# Patient Record
Sex: Female | Born: 1995 | Race: Black or African American | Hispanic: No | Marital: Single | Smoking: Former smoker
Health system: Southern US, Community
[De-identification: ages and names within clinical notes are randomized; demographics above are authoritative.]

## PROBLEM LIST (undated history)

## (undated) ENCOUNTER — Inpatient Hospital Stay: Payer: Self-pay

## (undated) ENCOUNTER — Inpatient Hospital Stay: Payer: Medicaid Other

## (undated) DIAGNOSIS — G4730 Sleep apnea, unspecified: Secondary | ICD-10-CM

## (undated) DIAGNOSIS — E119 Type 2 diabetes mellitus without complications: Secondary | ICD-10-CM

## (undated) DIAGNOSIS — I10 Essential (primary) hypertension: Secondary | ICD-10-CM

## (undated) DIAGNOSIS — J45909 Unspecified asthma, uncomplicated: Secondary | ICD-10-CM

## (undated) HISTORY — PX: SKIN GRAFT: SHX250

## (undated) HISTORY — PX: TONSILLECTOMY: SUR1361

## (undated) HISTORY — PX: CHOLECYSTECTOMY: SHX55

---
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
## 2017-03-23 ENCOUNTER — Inpatient Hospital Stay
Admission: EM | Admit: 2017-03-23 | Discharge: 2017-03-24 | Disposition: A | Discharge status: Home / Self Care | Payer: Medicaid Other | Attending: Obstetrics and Gynecology | Admitting: Obstetrics and Gynecology

## 2017-03-23 ENCOUNTER — Encounter: Payer: Self-pay | Admitting: *Deleted

## 2017-03-23 DIAGNOSIS — Z3A31 31 weeks gestation of pregnancy: Secondary | ICD-10-CM | POA: Diagnosis not present

## 2017-03-23 DIAGNOSIS — R109 Unspecified abdominal pain: Secondary | ICD-10-CM

## 2017-03-23 DIAGNOSIS — O24113 Pre-existing diabetes mellitus, type 2, in pregnancy, third trimester: Secondary | ICD-10-CM | POA: Insufficient documentation

## 2017-03-23 DIAGNOSIS — O26899 Other specified pregnancy related conditions, unspecified trimester: Secondary | ICD-10-CM

## 2017-03-23 DIAGNOSIS — E119 Type 2 diabetes mellitus without complications: Secondary | ICD-10-CM | POA: Diagnosis present

## 2017-03-23 DIAGNOSIS — Z7984 Long term (current) use of oral hypoglycemic drugs: Secondary | ICD-10-CM | POA: Diagnosis not present

## 2017-03-23 DIAGNOSIS — O4703 False labor before 37 completed weeks of gestation, third trimester: Secondary | ICD-10-CM | POA: Diagnosis not present

## 2017-03-23 LAB — CBC
HCT: 36.5 % (ref 35.0–47.0)
Hemoglobin: 12.6 g/dL (ref 12.0–16.0)
MCH: 29.9 pg (ref 26.0–34.0)
MCHC: 34.5 g/dL (ref 32.0–36.0)
MCV: 86.6 fL (ref 80.0–100.0)
Platelets: 212 10*3/uL (ref 150–440)
RBC: 4.22 MIL/uL (ref 3.80–5.20)
RDW: 13.7 % (ref 11.5–14.5)
WBC: 8.4 10*3/uL (ref 3.6–11.0)

## 2017-03-23 LAB — GLUCOSE, CAPILLARY: GLUCOSE-CAPILLARY: 312 mg/dL — AB (ref 65–99)

## 2017-03-23 MED ORDER — PRENATAL MULTIVITAMIN CH
1.0000 | ORAL_TABLET | Freq: Every day | ORAL | Status: DC
  Filled 2017-03-23: qty 1

## 2017-03-23 MED ORDER — LACTATED RINGERS IV BOLUS (SEPSIS)
500.0000 mL | Freq: Once | INTRAVENOUS | Status: AC
  Administered 2017-03-23: 500 mL via INTRAVENOUS

## 2017-03-23 MED ORDER — INSULIN REGULAR NICU BOLUS VIA INFUSION: 2.0000 [IU] | Freq: Once | INTRAVENOUS | Status: DC

## 2017-03-23 MED ORDER — ZOLPIDEM TARTRATE 5 MG PO TABS: 5.0000 mg | ORAL_TABLET | Freq: Every evening | ORAL | Status: DC | PRN

## 2017-03-23 MED ORDER — LACTATED RINGERS IV SOLN
INTRAVENOUS | Status: DC
  Administered 2017-03-23: via INTRAVENOUS

## 2017-03-23 MED ORDER — CALCIUM CARBONATE ANTACID 500 MG PO CHEW: 2.0000 | CHEWABLE_TABLET | ORAL | Status: DC | PRN

## 2017-03-23 MED ORDER — ACETAMINOPHEN 325 MG PO TABS: 650.0000 mg | ORAL_TABLET | ORAL | Status: DC | PRN

## 2017-03-23 NOTE — OB Triage Note (Signed)
recvd pt via EMS. Pt c/o contractions but is unable to tell us how far apart. Feeling baby move ok. No intercourse in the past 24 hours

## 2017-03-24 ENCOUNTER — Encounter: Payer: Self-pay | Admitting: *Deleted

## 2017-03-24 DIAGNOSIS — R109 Unspecified abdominal pain: Secondary | ICD-10-CM

## 2017-03-24 DIAGNOSIS — O24113 Pre-existing diabetes mellitus, type 2, in pregnancy, third trimester: Secondary | ICD-10-CM | POA: Diagnosis not present

## 2017-03-24 DIAGNOSIS — O4703 False labor before 37 completed weeks of gestation, third trimester: Secondary | ICD-10-CM | POA: Diagnosis not present

## 2017-03-24 DIAGNOSIS — Z3A31 31 weeks gestation of pregnancy: Secondary | ICD-10-CM | POA: Diagnosis not present

## 2017-03-24 DIAGNOSIS — O26899 Other specified pregnancy related conditions, unspecified trimester: Secondary | ICD-10-CM

## 2017-03-24 LAB — URINE DRUG SCREEN, QUALITATIVE (ARMC ONLY)
Amphetamines, Ur Screen: NOT DETECTED
BARBITURATES, UR SCREEN: NOT DETECTED
BENZODIAZEPINE, UR SCRN: NOT DETECTED
CANNABINOID 50 NG, UR ~~LOC~~: NOT DETECTED
Cocaine Metabolite,Ur ~~LOC~~: NOT DETECTED
MDMA (Ecstasy)Ur Screen: NOT DETECTED
METHADONE SCREEN, URINE: NOT DETECTED
Opiate, Ur Screen: NOT DETECTED
Phencyclidine (PCP) Ur S: NOT DETECTED
TRICYCLIC, UR SCREEN: NOT DETECTED

## 2017-03-24 LAB — URINALYSIS, ROUTINE W REFLEX MICROSCOPIC
Bacteria, UA: NONE SEEN
Bilirubin Urine: NEGATIVE
Glucose, UA: >=500 mg/dL — AB
Hgb urine dipstick: NEGATIVE
KETONES UR: NEGATIVE mg/dL
LEUKOCYTES UA: NEGATIVE
NITRITE: NEGATIVE
PH: 6 (ref 5.0–8.0)
Protein, ur: 30 mg/dL — AB
SPECIFIC GRAVITY, URINE: 1.031 — AB (ref 1.005–1.030)
WBC, UA: NONE SEEN WBC/hpf (ref 0–5)

## 2017-03-24 LAB — TYPE AND SCREEN
ABO/RH(D): A POS
ANTIBODY SCREEN: NEGATIVE

## 2017-03-24 LAB — GLUCOSE, CAPILLARY
GLUCOSE-CAPILLARY: 178 mg/dL — AB (ref 65–99)
Glucose-Capillary: 247 mg/dL — ABNORMAL HIGH (ref 65–99)
Glucose-Capillary: 225 mg/dL — ABNORMAL HIGH (ref 65–99)

## 2017-03-24 MED ORDER — METFORMIN HCL 500 MG PO TABS: 1000.0000 mg | ORAL_TABLET | Freq: Two times a day (BID) | ORAL | 0 refills | Status: DC

## 2017-03-24 MED ORDER — INSULIN REGULAR HUMAN 100 UNIT/ML IJ SOLN
2.0000 [IU] | Freq: Once | INTRAMUSCULAR | Status: DC
Start: 2017-03-24 — End: 2017-03-24
  Filled 2017-03-24: qty 0.02

## 2017-03-24 MED ORDER — INSULIN REGULAR HUMAN 100 UNIT/ML IJ SOLN
10.0000 [IU] | Freq: Once | INTRAMUSCULAR | Status: AC
  Administered 2017-03-24: 10 [IU] via SUBCUTANEOUS
  Filled 2017-03-24: qty 0.1

## 2017-03-24 MED ORDER — METFORMIN HCL 500 MG PO TABS
500.0000 mg | ORAL_TABLET | ORAL | Status: AC
  Administered 2017-03-24: 500 mg via ORAL
  Filled 2017-03-24 (×2): qty 1

## 2017-03-24 MED ORDER — METFORMIN HCL 500 MG PO TABS
500.0000 mg | ORAL_TABLET | Freq: Every day | ORAL | Status: DC
  Administered 2017-03-24: 500 mg via ORAL
  Filled 2017-03-24: qty 1

## 2017-03-24 NOTE — Discharge Summary (Signed)
L&D OB Triage Note    SUBJECTIVE Cassandra Flynn is a 21 y.o. G3P1 female at [redacted]w[redacted]d, EDD Estimated Date of Delivery: 05/21/17 who presented to triage with complaints of abdominal pain.   She is a type 2 diabetic who learned she was pregnant last week.  She is [redacted] weeks EGA by late U/S.  Previous C/D.  Taking  metformin BID.  She is followed at [facility] for her prenatal care.  Obstetric History   G3   P1   T0   P0   A0   L0    SAB0   TAB0   Ectopic0   Multiple0   Live Births0     # Outcome Date GA Lbr Len/2nd Weight Sex Delivery Anes PTL Lv  3 Current           2 Gravida           1 Para               Prescriptions Prior to Admission  Medication Sig Dispense Refill Last Dose  . [DISCONTINUED] metFORMIN (GLUCOPHAGE) 500 MG tablet Take by mouth daily with breakfast.   03/23/2017 at Unknown time  . buPROPion 450 MG TB24 Take 450 mg by mouth daily. (Patient not taking: Reported on 02/14/2016) 30 tablet 0 Not Taking at Unknown time  . HYDROcodone-acetaminophen (NORCO) 5-325 MG tablet Take 1 tablet by mouth every 6 (six) hours as needed for severe pain. 10 tablet 0   . lurasidone (LATUDA) 40 MG TABS tablet Take 1 tablet (40 mg total) by mouth daily with breakfast. (Patient not taking: Reported on 02/14/2016) 30 tablet 0 Completed Course at Unknown time  . nitrofurantoin (MACRODANTIN) 100 MG capsule Take 1 capsule (100 mg total) by mouth 2 (two) times daily. (Patient not taking: Reported on 02/14/2016) 6 capsule 0 Completed Course at Unknown time  . norelgestromin-ethinyl estradiol (ORTHO EVRA) 150-35 MCG/24HR transdermal patch Place 1 patch onto the skin once a week. Reported on 02/14/2016   Completed Course at Unknown time  . ondansetron (ZOFRAN ODT) 8 MG disintegrating tablet Take 1 tablet (8 mg total) by mouth every 8 (eight) hours as needed for nausea or vomiting. (Patient not taking: Reported on 02/14/2016) 20 tablet 0 Completed Course at Unknown time  . ranitidine (ZANTAC) 150 MG capsule Take 1  capsule (150 mg total) by mouth 2 (two) times daily. (Patient not taking: Reported on 02/14/2016) 28 capsule 0 Completed Course at Unknown time  . traZODone (DESYREL) 25 mg TABS tablet Take 0.5 tablets (25 mg total) by mouth at bedtime as needed for sleep. (Patient not taking: Reported on 02/14/2016) 30 tablet 0 Completed Course at Unknown time     OBJECTIVE  Nursing Evaluation:   BP (!) 109/57 (BP Location: Left Arm)   Pulse (!) 101   Temp 98.2 F (36.8 C) (Oral)   Resp 20   LMP 08/14/2016 (LMP Unknown)    Findings:   Irregular, mild contractions.  Cervix FT.        Blood glucose 300+      NST was performed and has been reviewed by me.  NST INTERPRETATION: Category I  Mode: External Baseline Rate (A): 135 bpm Variability: Moderate Accelerations: 15 x 15 Decelerations: None     Contraction Frequency (min): none noted at discharge.  Pt gradually became more comfortable through the night and by AM her abdominal pain was gone.   Her blood sugars were modified by giving 20u sub Q insulin followed by   of metformin. Her blood sugar at discharge was 170 immediately prior to her morning metformin dose.   ASSESSMENT Impression:  1. Pregnancy:  G3P1 at [redacted]w[redacted]d , EDD Estimated Date of Delivery: 05/21/17 2.  reactive  PLAN 1. Reassurance given 2. Discharge home with precautions to return to L&D or call the office if:  contractions more than  12 per  1 hour or bleeding from vaginal area  3. Continue routine prenatal care at [facility]. 4.  I have changed her metformin dose to  PO BID. 5. We have discussed fingerstick glucose monitoring 4 times per day. 6.  She has been advised to keep her appointment at [facility] on Tuesday.    I was directly present for this patient's care on 4-13 and again for her discharge on 4-14.

## 2017-08-10 ENCOUNTER — Emergency Department: Payer: Medicaid Other

## 2017-08-10 ENCOUNTER — Encounter: Payer: Self-pay | Admitting: Emergency Medicine

## 2017-08-10 ENCOUNTER — Emergency Department
Admission: EM | Admit: 2017-08-10 | Discharge: 2017-08-11 | Disposition: A | Discharge status: Home / Self Care | Payer: Medicaid Other | Attending: Student in an Organized Health Care Education/Training Program | Admitting: Student in an Organized Health Care Education/Training Program

## 2017-08-10 DIAGNOSIS — R079 Chest pain, unspecified: Secondary | ICD-10-CM | POA: Diagnosis present

## 2017-08-10 DIAGNOSIS — J45909 Unspecified asthma, uncomplicated: Secondary | ICD-10-CM | POA: Diagnosis not present

## 2017-08-10 DIAGNOSIS — E119 Type 2 diabetes mellitus without complications: Secondary | ICD-10-CM | POA: Diagnosis not present

## 2017-08-10 DIAGNOSIS — R0602 Shortness of breath: Secondary | ICD-10-CM | POA: Diagnosis not present

## 2017-08-10 DIAGNOSIS — R0789 Other chest pain: Secondary | ICD-10-CM | POA: Insufficient documentation

## 2017-08-10 DIAGNOSIS — Z7984 Long term (current) use of oral hypoglycemic drugs: Secondary | ICD-10-CM | POA: Insufficient documentation

## 2017-08-10 LAB — CBC
HCT: 41.9 % (ref 35.0–47.0)
Hemoglobin: 14.2 g/dL (ref 12.0–16.0)
MCH: 29.3 pg (ref 26.0–34.0)
MCHC: 34 g/dL (ref 32.0–36.0)
MCV: 86.2 fL (ref 80.0–100.0)
PLATELETS: 253 10*3/uL (ref 150–440)
RBC: 4.86 MIL/uL (ref 3.80–5.20)
RDW: 15.2 % — ABNORMAL HIGH (ref 11.5–14.5)
WBC: 8.5 10*3/uL (ref 3.6–11.0)

## 2017-08-10 LAB — TROPONIN I: Troponin I: <0.03 ng/mL (ref <0.03)

## 2017-08-10 LAB — BASIC METABOLIC PANEL
Anion gap: 11 (ref 5–15)
BUN: 16 mg/dL (ref 6–20)
CO2: 24 mmol/L (ref 22–32)
CREATININE: 0.86 mg/dL (ref 0.44–1.00)
Calcium: 9.9 mg/dL (ref 8.9–10.3)
Chloride: 99 mmol/L — ABNORMAL LOW (ref 101–111)
GFR calc non Af Amer: >60 mL/min (ref >60)
Glucose, Bld: 349 mg/dL — ABNORMAL HIGH (ref 65–99)
Potassium: 4.4 mmol/L (ref 3.5–5.1)
Sodium: 134 mmol/L — ABNORMAL LOW (ref 135–145)

## 2017-08-10 LAB — POCT PREGNANCY, URINE: Preg Test, Ur: NEGATIVE

## 2017-08-10 MED ORDER — IPRATROPIUM-ALBUTEROL 0.5-2.5 (3) MG/3ML IN SOLN
3.0000 mL | Freq: Once | RESPIRATORY_TRACT | Status: AC
  Administered 2017-08-10: 3 mL via RESPIRATORY_TRACT
  Filled 2017-08-10: qty 3

## 2017-08-10 NOTE — ED Triage Notes (Signed)
Pt ambulatory to triage with steady gait, no distress noted. Pt c/o constant central chest pain x2 days with SOB and worsening when taking a deep breath. Pt denies N/V.

## 2017-08-10 NOTE — ED Notes (Signed)
Urged patient to void, placed hat in toilet

## 2017-08-10 NOTE — ED Provider Notes (Signed)
[facility] Emergency Department Provider Note    First MD Initiated Contact with Patient 08/10/17 2229     (approximate)  I have reviewed the triage vital signs and the nursing notes.   HISTORY  Chief Complaint Chest Pain    HPI Cassandra Flynn is a 21 y.o. female history of diabetes as well as asthma presents with several days of pleuritic chest pain associated with shortness of breath. Has had bronchitis before but feels this is different. No lower extremity swelling. No history of blood clots. Denies any chance of being pregnant but is on birth control. States the pain is worse with taking a breath. There is a antibiotics. No recent steroids.   Past Medical History:  Diagnosis Date  . Asthma   . Diabetes mellitus without complication (HCC)   . Sleep apnea    History reviewed. No pertinent family history. Past Surgical History:  Procedure Laterality Date  . CESAREAN SECTION    . CHOLECYSTECTOMY     Patient Active Problem List   Diagnosis Date Noted  . Abdominal pain affecting pregnancy, antepartum 03/24/2017  . Type 2 diabetes mellitus affecting pregnancy in third trimester, antepartum 03/24/2017  . Morbid obesity (HCC) 03/24/2017  . Labor and delivery, indication for care 02/22/2016  . Post traumatic stress disorder (PTSD) 06/16/2014  . Bipolar I disorder, most recent episode (or current) mixed, severe, specified as with psychotic behavior 06/16/2014  . ODD (oppositional defiant disorder) 06/16/2014      Prior to Admission medications   Medication Sig Start Date End Date Taking? Authorizing Provider  albuterol (PROVENTIL HFA;VENTOLIN HFA) 108 (90 Base) MCG/ACT inhaler Inhale 2 puffs into the lungs every 6 (six) hours as needed for wheezing or shortness of breath. 08/11/17   Willy Eddy, MD  metFORMIN (GLUCOPHAGE) 500 MG tablet Take 2 tablets (1,000 mg total) by mouth 2 (two) times daily with a meal. 03/24/17   Linzie Collin,  MD  norelgestromin-ethinyl estradiol (ORTHO EVRA) 150-35 MCG/24HR transdermal patch Place 1 patch onto the skin once a week. Reported on 02/14/2016    [provider]  ondansetron (ZOFRAN ODT) 8 MG disintegrating tablet Take 1 tablet (8 mg total) by mouth every 8 (eight) hours as needed for nausea or vomiting. Patient not taking: Reported on 02/14/2016 08/29/15   Sharman Cheek, MD  predniSONE (DELTASONE) 20 MG tablet Take 2 tablets (40 mg total) by mouth daily. 08/11/17 08/16/17  Willy Eddy, MD  ranitidine (ZANTAC) 150 MG capsule Take 1 capsule (150 mg total) by mouth 2 (two) times daily. Patient not taking: Reported on 02/14/2016 08/29/15   Sharman Cheek, MD  traZODone (DESYREL) 25 mg TABS tablet Take 0.5 tablets (25 mg total) by mouth at bedtime as needed for sleep. Patient not taking: Reported on 02/14/2016 06/23/14   Kendrick Fries, NP    Allergies Penicillins    Social History Social History  Substance Use Topics  . Smoking status: Never Smoker  . Smokeless tobacco: Never Used  . Alcohol use No    Review of Systems Patient denies headaches, rhinorrhea, blurry vision, numbness, shortness of breath, chest pain, edema, cough, abdominal pain, nausea, vomiting, diarrhea, dysuria, fevers, rashes or hallucinations unless otherwise stated above in HPI. ____________________________________________   PHYSICAL EXAM:  VITAL SIGNS: Vitals:   08/10/17 1935 08/10/17 2259  BP: 119/64 (!) 134/98  Pulse: (!) 110 (!) 112  Resp: 18 18  Temp: 98.4 F (36.9 C)   SpO2: 99% 100%    Constitutional: Alert  and oriented. Morbidly obese, in no acute distress. Eyes: Conjunctivae are normal.  Head: Atraumatic. Nose: No congestion/rhinnorhea. Mouth/Throat: Mucous membranes are moist.   Neck: No stridor. Painless ROM.  Cardiovascular: Normal rate, regular rhythm. Grossly normal heart sounds.  Good peripheral circulation. Respiratory: Normal respiratory effort.  No retractions. Lungs  with coarse bilateral breathsounds and good air movement. Gastrointestinal: Soft and nontender. No distention. No abdominal bruits. No CVA tenderness. Genitourinary:  Musculoskeletal: No lower extremity tenderness nor edema.  No joint effusions. Neurologic:  Normal speech and language. No gross focal neurologic deficits are appreciated. No facial droop Skin:  Skin is warm, dry and intact. No rash noted. Psychiatric: Mood and affect are normal. Speech and behavior are normal.  ____________________________________________   LABS (all labs ordered are listed, but only abnormal results are displayed)  Results for orders placed or performed during the hospital encounter of 08/10/17 (from the past 24 hour(s))  Basic metabolic panel     Status: Abnormal   Collection Time: 08/10/17  7:29 PM  Result Value Ref Range   Sodium 134 (L) 135 - 145 mmol/L   Potassium 4.4 3.5 - 5.1 mmol/L   Chloride 99 (L) 101 - 111 mmol/L   CO2 24 22 - 32 mmol/L   Glucose, Bld 349 (H) 65 - 99 mg/dL   BUN 16 6 - 20 mg/dL   Creatinine, Ser 0.10 0.44 - 1.00 mg/dL   Calcium 9.9 8.9 - 27.2 mg/dL   GFR calc non Af Amer >60 >60 mL/min   GFR calc Af Amer >60 >60 mL/min   Anion gap 11 5 - 15  CBC     Status: Abnormal   Collection Time: 08/10/17  7:29 PM  Result Value Ref Range   WBC 8.5 3.6 - 11.0 K/uL   RBC 4.86 3.80 - 5.20 MIL/uL   Hemoglobin 14.2 12.0 - 16.0 g/dL   HCT 53.6 64.4 - 03.4 %   MCV 86.2 80.0 - 100.0 fL   MCH 29.3 26.0 - 34.0 pg   MCHC 34.0 32.0 - 36.0 g/dL   RDW 74.2 (H) 59.5 - 63.8 %   Platelets 253 150 - 440 K/uL  Troponin I     Status: None   Collection Time: 08/10/17  7:29 PM  Result Value Ref Range   Troponin I <0.03 <0.03 ng/mL  Pregnancy, urine POC     Status: None   Collection Time: 08/11/17 12:01 AM  Result Value Ref Range   Preg Test, Ur NEGATIVE NEGATIVE   ____________________________________________  EKG My review and personal interpretation at Time: 19:26   Indication: chest  pain  Rate: 110  Rhythm: sinus Axis: normal Other: normal intervals, no stemi, no pr depressions ____________________________________________  RADIOLOGY  I personally reviewed all radiographic images ordered to evaluate for the above acute complaints and reviewed radiology reports and findings.  These findings were personally discussed with the patient.  Please see medical record for radiology report.  ____________________________________________   PROCEDURES  Procedure(s) performed:  Procedures    Critical Care performed: no ____________________________________________   INITIAL IMPRESSION / ASSESSMENT AND PLAN / ED COURSE  Pertinent labs & imaging results that were available during my care of the patient were reviewed by me and considered in my medical decision making (see chart for details).  DDX: Asthma, copd, CHF, pna, ptx, malignancy, Pe, anemia   Cassandra Flynn is a 21 y.o. who presents to the ED with shortness breath and pleuritic chest pain as described above. Does go  exam is more suggestive of asthma versus bronchitis exacerbation. Abdominal exam is soft and benign. EKG shows no evidence of dysrhythmia or ACS. Chest x-ray shows bronchitic changes. As the patient is on birth control describing pleuritic chest pain and do feel that she needs further risk stratification d-dimer. Patient will be signed out to Dr. Dolores FrameSung pending results of tests. I do anticipate discharge, she is otherwise in no acute distress.      ____________________________________________   FINAL CLINICAL IMPRESSION(S) / ED DIAGNOSES  Final diagnoses:  Shortness of breath      NEW MEDICATIONS STARTED DURING THIS VISIT:  New Prescriptions   ALBUTEROL (PROVENTIL HFA;VENTOLIN HFA) 108 (90 BASE) MCG/ACT INHALER    Inhale 2 puffs into the lungs every 6 (six) hours as needed for wheezing or shortness of breath.   PREDNISONE (DELTASONE) 20 MG TABLET    Take 2 tablets (40 mg total) by mouth  daily.     Note:  This document was prepared using Dragon voice recognition software and may include unintentional dictation errors.    Willy Eddyobinson, Jaspreet Bodner, MD 08/11/17 [phone]

## 2017-08-11 ENCOUNTER — Emergency Department: Payer: Medicaid Other

## 2017-08-11 LAB — FIBRIN DERIVATIVES D-DIMER (ARMC ONLY): Fibrin derivatives D-dimer (ARMC): 563.24 — ABNORMAL HIGH (ref 0.00–499.00)

## 2017-08-11 MED ORDER — IOPAMIDOL (ISOVUE-370) INJECTION 76%
75.0000 mL | Freq: Once | INTRAVENOUS | Status: AC | PRN
  Administered 2017-08-11: 75 mL via INTRAVENOUS

## 2017-08-11 MED ORDER — HYDROCODONE-ACETAMINOPHEN 5-325 MG PO TABS
1.0000 | ORAL_TABLET | Freq: Once | ORAL | Status: AC
  Administered 2017-08-11: 1 via ORAL
  Filled 2017-08-11: qty 1

## 2017-08-11 MED ORDER — PREDNISONE 20 MG PO TABS: 40.0000 mg | ORAL_TABLET | Freq: Every day | ORAL | 0 refills | Status: AC

## 2017-08-11 MED ORDER — ALBUTEROL SULFATE HFA 108 (90 BASE) MCG/ACT IN AERS: 2.0000 | INHALATION_SPRAY | Freq: Four times a day (QID) | RESPIRATORY_TRACT | 2 refills | Status: DC | PRN

## 2017-08-11 MED ORDER — KETOROLAC TROMETHAMINE 30 MG/ML IJ SOLN
10.0000 mg | Freq: Once | INTRAMUSCULAR | Status: AC
  Administered 2017-08-11: 9.9 mg via INTRAVENOUS
  Filled 2017-08-11: qty 1

## 2017-08-11 MED ORDER — HYDROCODONE-ACETAMINOPHEN 5-325 MG PO TABS: 1.0000 | ORAL_TABLET | Freq: Four times a day (QID) | ORAL | 0 refills | Status: DC | PRN

## 2017-08-11 MED ORDER — IBUPROFEN 800 MG PO TABS: 800.0000 mg | ORAL_TABLET | Freq: Three times a day (TID) | ORAL | 0 refills | Status: DC | PRN

## 2017-08-11 NOTE — Discharge Instructions (Addendum)
1. You may take pain medicines as needed (Motrin/Norco #15). 2. Apply moist heat to affected area several times daily. 3. Return to the ER for worsening symptoms, persistent vomiting, difficulty breathing, or other concerns.

## 2017-08-11 NOTE — ED Provider Notes (Signed)
-----------------------------------------   2:10 AM on 08/11/2017 -----------------------------------------  Patient sleeping in no acute distress. Awakened her to update her of mildly elevated Ddimer result. Rates chest pain 8/10. Will attempt to establish IV for CTA chest to evaluate PE.  ----------------------------------------- 3:40 AM on 08/11/2017 -----------------------------------------  CT chest interpreted per Dr. Clovis RileyMitchell: Negative for acute pulmonary embolism.    No significant abnormality in the chest. Marked fatty infiltration  of the liver, incompletely imaged.   Updated patient of CT chest. Will prescribe NSAIDs, analgesia for chest pain. Strict return precautions given. Patient verbalizes understanding and agrees with plan of care.   Irean HongSung, Giancarlos Berendt J, MD 08/11/17 [phone]

## 2017-08-12 ENCOUNTER — Emergency Department: Payer: Medicaid Other

## 2017-08-12 ENCOUNTER — Emergency Department
Admission: EM | Admit: 2017-08-12 | Discharge: 2017-08-12 | Disposition: A | Discharge status: Home / Self Care | Payer: Medicaid Other | Attending: Emergency Medicine | Admitting: Emergency Medicine

## 2017-08-12 ENCOUNTER — Encounter: Payer: Self-pay | Admitting: Emergency Medicine

## 2017-08-12 DIAGNOSIS — E1165 Type 2 diabetes mellitus with hyperglycemia: Secondary | ICD-10-CM | POA: Diagnosis not present

## 2017-08-12 DIAGNOSIS — J45909 Unspecified asthma, uncomplicated: Secondary | ICD-10-CM | POA: Diagnosis not present

## 2017-08-12 DIAGNOSIS — M25551 Pain in right hip: Secondary | ICD-10-CM

## 2017-08-12 DIAGNOSIS — W19XXXA Unspecified fall, initial encounter: Secondary | ICD-10-CM

## 2017-08-12 DIAGNOSIS — R739 Hyperglycemia, unspecified: Secondary | ICD-10-CM

## 2017-08-12 LAB — CBC WITH DIFFERENTIAL/PLATELET
BASOS ABS: 0.1 10*3/uL (ref 0–0.1)
Basophils Relative: 1 %
EOS ABS: 0.4 10*3/uL (ref 0–0.7)
EOS PCT: 4 %
HCT: 38.8 % (ref 35.0–47.0)
Hemoglobin: 13.3 g/dL (ref 12.0–16.0)
LYMPHS PCT: 31 %
Lymphs Abs: 2.8 10*3/uL (ref 1.0–3.6)
MCH: 29.6 pg (ref 26.0–34.0)
MCHC: 34.2 g/dL (ref 32.0–36.0)
MCV: 86.5 fL (ref 80.0–100.0)
Monocytes Absolute: 0.5 10*3/uL (ref 0.2–0.9)
Monocytes Relative: 6 %
Neutro Abs: 5.1 10*3/uL (ref 1.4–6.5)
Neutrophils Relative %: 58 %
Platelets: 256 10*3/uL (ref 150–440)
RBC: 4.48 MIL/uL (ref 3.80–5.20)
RDW: 14.9 % — AB (ref 11.5–14.5)
WBC: 8.8 10*3/uL (ref 3.6–11.0)

## 2017-08-12 LAB — COMPREHENSIVE METABOLIC PANEL
ALBUMIN: 3.9 g/dL (ref 3.5–5.0)
ALT: 82 U/L — ABNORMAL HIGH (ref 14–54)
ANION GAP: 11 (ref 5–15)
AST: 109 U/L — AB (ref 15–41)
Alkaline Phosphatase: 73 U/L (ref 38–126)
BILIRUBIN TOTAL: 1 mg/dL (ref 0.3–1.2)
BUN: 15 mg/dL (ref 6–20)
CHLORIDE: 103 mmol/L (ref 101–111)
CO2: 20 mmol/L — AB (ref 22–32)
Calcium: 9.6 mg/dL (ref 8.9–10.3)
Creatinine, Ser: 0.92 mg/dL (ref 0.44–1.00)
GFR calc Af Amer: >60 mL/min (ref >60)
GFR calc non Af Amer: >60 mL/min (ref >60)
GLUCOSE: 364 mg/dL — AB (ref 65–99)
POTASSIUM: 4.7 mmol/L (ref 3.5–5.1)
SODIUM: 134 mmol/L — AB (ref 135–145)
Total Protein: 8 g/dL (ref 6.5–8.1)

## 2017-08-12 LAB — HCG, QUANTITATIVE, PREGNANCY: hCG, Beta Chain, Quant, S: <1 m[IU]/mL (ref <5)

## 2017-08-12 MED ORDER — HYDROMORPHONE HCL 1 MG/ML IJ SOLN
1.0000 mg | Freq: Once | INTRAMUSCULAR | Status: AC
  Administered 2017-08-12: 1 mg via INTRAVENOUS
  Filled 2017-08-12: qty 1

## 2017-08-12 MED ORDER — OXYCODONE-ACETAMINOPHEN 5-325 MG PO TABS: 1.0000 | ORAL_TABLET | Freq: Four times a day (QID) | ORAL | 0 refills | Status: DC | PRN

## 2017-08-12 NOTE — ED Notes (Signed)

## 2017-08-12 NOTE — ED Notes (Signed)
Pt given walker as crutches have 300lb weight limit. Explained to pt that walker also only has 350lb weight limit and thus not to put her full weight onto it but use it for balance support while putting most of weight onto her good leg.

## 2017-08-12 NOTE — ED Provider Notes (Addendum)
[facility] Emergency Department Provider Note       Time seen: ----------------------------------------- 4:21 PM on 08/12/2017 -----------------------------------------     I have reviewed the triage vital signs and the nursing notes.   HISTORY   Chief Complaint Hip Pain    HPI Cassandra Flynn is a 21 y.o. female who presents to the ED for a slip and fall. Patient states she has a mat on her front porch and he gets very slippery when it is wet which resulted in a fall today. Patient states she fell several weeks ago same way but did not come to the ER. Main complaint today is severe, 10 out of 10 pain in the right hip. Patient is having trouble walking due to the pain. She denies any other injuries or complaints. She has had recent back pain. Patient describes paresthesias in the right.   Past Medical History:  Diagnosis Date  . Asthma   . Diabetes mellitus without complication (HCC)   . Sleep apnea     Patient Active Problem List   Diagnosis Date Noted  . Abdominal pain affecting pregnancy, antepartum 03/24/2017  . Type 2 diabetes mellitus affecting pregnancy in third trimester, antepartum 03/24/2017  . Morbid obesity (HCC) 03/24/2017  . Labor and delivery, indication for care 02/22/2016  . Post traumatic stress disorder (PTSD) 06/16/2014  . Bipolar I disorder, most recent episode (or current) mixed, severe, specified as with psychotic behavior 06/16/2014  . ODD (oppositional defiant disorder) 06/16/2014    Past Surgical History:  Procedure Laterality Date  . CESAREAN SECTION    . CHOLECYSTECTOMY      Allergies Penicillins  Social History Social History  Substance Use Topics  . Smoking status: Never Smoker  . Smokeless tobacco: Never Used  . Alcohol use No    Review of Systems Constitutional: Negative for fever. Cardiovascular: Negative for chest pain. Respiratory: Negative for shortness of breath. Gastrointestinal:  Negative for abdominal pain, vomiting and diarrhea. Genitourinary: Negative for dysuria. Musculoskeletal: Positive for right hip and leg pain Skin: Negative for rash. Neurological: Negative for headaches, positive for right thigh paresthesias.  All systems negative/normal/unremarkable except as stated in the HPI  ____________________________________________   PHYSICAL EXAM:  VITAL SIGNS: ED Triage Vitals  Enc Vitals Group     BP      Pulse      Resp      Temp      Temp src      SpO2      Weight      Height      Head Circumference      Peak Flow      Pain Score      Pain Loc      Pain Edu?      Excl. in GC?     Constitutional: Alert and oriented. Mild distress from pain Eyes: Conjunctivae are normal. Normal extraocular movements. ENT   Head: Normocephalic and atraumatic.   Nose: No congestion/rhinnorhea.   Mouth/Throat: Mucous membranes are moist.   Neck: No stridor. Cardiovascular: Normal rate, regular rhythm. No murmurs, rubs, or gallops. Respiratory: Normal respiratory effort without tachypnea nor retractions. Breath sounds are clear and equal bilaterally. No wheezes/rales/rhonchi. Gastrointestinal: Soft and nontender. Normal bowel sounds Musculoskeletal: Severe tenderness around the right hip, severe pain range of motion the right hip. Neurologic:  Normal speech and language. No gross focal neurologic deficits are appreciated. Paresthesias noted to the right thigh laterally Skin:  Skin is warm, dry and  intact. No rash noted. Psychiatric: Mood and affect are normal. Speech and behavior are normal.  ___________________________________________  ED COURSE:  Pertinent labs & imaging results that were available during my care of the patient were reviewed by me and considered in my medical decision making (see chart for details). Patient presents for a fall with right hip pain, we will assess with labs and imaging as indicated.    Procedures ____________________________________________   LABS (pertinent positives/negatives)  Labs Reviewed  CBC WITH DIFFERENTIAL/PLATELET - Abnormal; Notable for the following:       Result Value   RDW 14.9 (*)    All other components within normal limits  COMPREHENSIVE METABOLIC PANEL - Abnormal; Notable for the following:    Sodium 134 (*)    CO2 20 (*)    Glucose, Bld 364 (*)    AST 109 (*)    ALT 82 (*)    All other components within normal limits  HCG, QUANTITATIVE, PREGNANCY    RADIOLOGY Images were viewed by me  Right hip x-rays, lumbar spine Are negative ____________________________________________  FINAL ASSESSMENT AND PLAN  Fall, right hip contusion, hyperglycemia  Plan: Patient's labs and imaging were dictated above. Patient had presented for a mechanical fall with right hip pain. X-rays were negative for any fracture. She is able to ambulate with some pain using crutches but otherwise has had a negative workup for injury. She is hyperglycemic and have advised her to stick to the diabetic diet as well as continue taking oral medicines for diabetes.   Emily FilbertWilliams, Jonathan E, MD   Note: This note was generated in part or whole with voice recognition software. Voice recognition is usually quite accurate but there are transcription errors that can and very often do occur. I apologize for any typographical errors that were not detected and corrected.     Emily FilbertWilliams, Jonathan E, MD 08/12/17 1738    Emily FilbertWilliams, Jonathan E, MD 08/12/17 [phone]

## 2017-08-26 ENCOUNTER — Encounter: Payer: Self-pay | Admitting: *Deleted

## 2017-08-26 ENCOUNTER — Emergency Department
Admission: EM | Admit: 2017-08-26 | Discharge: 2017-08-26 | Disposition: A | Discharge status: Home / Self Care | Payer: Medicaid Other | Attending: Emergency Medicine | Admitting: Emergency Medicine

## 2017-08-26 DIAGNOSIS — J45909 Unspecified asthma, uncomplicated: Secondary | ICD-10-CM | POA: Diagnosis not present

## 2017-08-26 DIAGNOSIS — Z7984 Long term (current) use of oral hypoglycemic drugs: Secondary | ICD-10-CM | POA: Insufficient documentation

## 2017-08-26 DIAGNOSIS — R440 Auditory hallucinations: Secondary | ICD-10-CM | POA: Insufficient documentation

## 2017-08-26 DIAGNOSIS — F319 Bipolar disorder, unspecified: Secondary | ICD-10-CM | POA: Diagnosis not present

## 2017-08-26 DIAGNOSIS — E119 Type 2 diabetes mellitus without complications: Secondary | ICD-10-CM | POA: Insufficient documentation

## 2017-08-26 LAB — URINE DRUG SCREEN, QUALITATIVE (ARMC ONLY)
Amphetamines, Ur Screen: NOT DETECTED
BARBITURATES, UR SCREEN: NOT DETECTED
Benzodiazepine, Ur Scrn: NOT DETECTED
COCAINE METABOLITE, UR ~~LOC~~: NOT DETECTED
Cannabinoid 50 Ng, Ur ~~LOC~~: NOT DETECTED
MDMA (Ecstasy)Ur Screen: NOT DETECTED
METHADONE SCREEN, URINE: NOT DETECTED
Opiate, Ur Screen: NOT DETECTED
Phencyclidine (PCP) Ur S: NOT DETECTED
TRICYCLIC, UR SCREEN: NOT DETECTED

## 2017-08-26 LAB — COMPREHENSIVE METABOLIC PANEL
ALBUMIN: 3.8 g/dL (ref 3.5–5.0)
ALT: 69 U/L — ABNORMAL HIGH (ref 14–54)
ANION GAP: 12 (ref 5–15)
AST: 83 U/L — AB (ref 15–41)
Alkaline Phosphatase: 74 U/L (ref 38–126)
BUN: 13 mg/dL (ref 6–20)
CHLORIDE: 101 mmol/L (ref 101–111)
CO2: 19 mmol/L — AB (ref 22–32)
Calcium: 9.6 mg/dL (ref 8.9–10.3)
Creatinine, Ser: 0.92 mg/dL (ref 0.44–1.00)
GFR calc non Af Amer: >60 mL/min (ref >60)
Glucose, Bld: 411 mg/dL — ABNORMAL HIGH (ref 65–99)
POTASSIUM: 4.3 mmol/L (ref 3.5–5.1)
Sodium: 132 mmol/L — ABNORMAL LOW (ref 135–145)
Total Bilirubin: 0.6 mg/dL (ref 0.3–1.2)
Total Protein: 7.4 g/dL (ref 6.5–8.1)

## 2017-08-26 LAB — CBC
HCT: 41.1 % (ref 35.0–47.0)
HEMOGLOBIN: 13.9 g/dL (ref 12.0–16.0)
MCH: 29.8 pg (ref 26.0–34.0)
MCHC: 33.9 g/dL (ref 32.0–36.0)
MCV: 88 fL (ref 80.0–100.0)
Platelets: 207 10*3/uL (ref 150–440)
RBC: 4.68 MIL/uL (ref 3.80–5.20)
RDW: 14.5 % (ref 11.5–14.5)
WBC: 7 10*3/uL (ref 3.6–11.0)

## 2017-08-26 LAB — ACETAMINOPHEN LEVEL

## 2017-08-26 LAB — SALICYLATE LEVEL

## 2017-08-26 LAB — PREGNANCY, URINE: Preg Test, Ur: NEGATIVE

## 2017-08-26 LAB — ETHANOL: Alcohol, Ethyl (B): <5 mg/dL (ref <5)

## 2017-08-26 MED ORDER — OLANZAPINE 5 MG PO TBDP
5.0000 mg | ORAL_TABLET | Freq: Once | ORAL | Status: AC
  Administered 2017-08-26: 5 mg via ORAL
  Filled 2017-08-26: qty 1

## 2017-08-26 MED ORDER — OLANZAPINE 5 MG PO TABS: 5.0000 mg | ORAL_TABLET | Freq: Every day | ORAL | 0 refills | Status: DC

## 2017-08-26 NOTE — ED Provider Notes (Signed)
The patient has been evaluated at bedside by [facility] psychiatry.  Patient is clinically stable.  Not felt to be a danger to self or others.  No SI or Hi.  No indication for inpatient psychiatric admission at this time.  Appropriate for continued outpatient therapy.  Has recommended stopping wellbutrin and starting  zyprexa qhs.    Willy Eddy, MD 08/26/17 [phone]

## 2017-08-26 NOTE — ED Notes (Signed)
BEHAVIORAL HEALTH ROUNDING  Patient sleeping: No.  Patient alert and oriented: yes  Behavior appropriate: Yes. ; If no, describe:  Nutrition and fluids offered: Yes  Toileting and hygiene offered: Yes  Sitter present: not applicable, Q 15 min safety rounds and observation.  Law enforcement present: Yes ODS  

## 2017-08-26 NOTE — ED Notes (Signed)
Pt provided dinner tray.

## 2017-08-26 NOTE — Discharge Instructions (Signed)
Stop taking your welbutrin and start taking Zyprexa as discussed with psychiatry. Follow-up with primary care provider.  Return for any worsening symptoms including thoughts of hurting herself or others.

## 2017-08-26 NOTE — ED Provider Notes (Signed)
[facility] Emergency Department Provider Note   ____________________________________________   First MD Initiated Contact with Patient 08/26/17 1420     (approximate)  I have reviewed the triage vital signs and the nursing notes.   HISTORY  Chief Complaint Suicidal    HPI Cassandra Flynn is a 21 y.o. female Patient reports she is not currently suicidal but has been and is hearing voices that are telling her to hurt herself. Patient has had these voices before. She says she is taking her medicines.   Past Medical History:  Diagnosis Date  . Asthma   . Diabetes mellitus without complication (HCC)   . Sleep apnea     Patient Active Problem List   Diagnosis Date Noted  . Abdominal pain affecting pregnancy, antepartum 03/24/2017  . Type 2 diabetes mellitus affecting pregnancy in third trimester, antepartum 03/24/2017  . Morbid obesity (HCC) 03/24/2017  . Labor and delivery, indication for care 02/22/2016  . Post traumatic stress disorder (PTSD) 06/16/2014  . Bipolar I disorder, most recent episode (or current) mixed, severe, specified as with psychotic behavior 06/16/2014  . ODD (oppositional defiant disorder) 06/16/2014    Past Surgical History:  Procedure Laterality Date  . CESAREAN SECTION    . CHOLECYSTECTOMY      Prior to Admission medications   Medication Sig Start Date End Date Taking? Authorizing Provider  albuterol (PROVENTIL HFA;VENTOLIN HFA) 108 (90 Base) MCG/ACT inhaler Inhale 2 puffs into the lungs every 6 (six) hours as needed for wheezing or shortness of breath. 08/11/17   Willy Eddy, MD  HYDROcodone-acetaminophen (NORCO) 5-325 MG tablet Take 1 tablet by mouth every 6 (six) hours as needed for moderate pain. 08/11/17   Irean Hong, MD  ibuprofen (ADVIL,MOTRIN) 800 MG tablet Take 1 tablet (800 mg total) by mouth every 8 (eight) hours as needed for moderate pain. 08/11/17   Irean Hong, MD  metFORMIN (GLUCOPHAGE) 500 MG  tablet Take 2 tablets (1,000 mg total) by mouth 2 (two) times daily with a meal. 03/24/17   Linzie Collin, MD  norelgestromin-ethinyl estradiol (ORTHO EVRA) 150-35 MCG/24HR transdermal patch Place 1 patch onto the skin once a week. Reported on 02/14/2016    [provider]  ondansetron (ZOFRAN ODT) 8 MG disintegrating tablet Take 1 tablet (8 mg total) by mouth every 8 (eight) hours as needed for nausea or vomiting. Patient not taking: Reported on 02/14/2016 08/29/15   Sharman Cheek, MD  oxyCODONE-acetaminophen (PERCOCET) 5-325 MG tablet Take 1-2 tablets by mouth every 6 (six) hours as needed. 08/12/17   Emily Filbert, MD  ranitidine (ZANTAC) 150 MG capsule Take 1 capsule (150 mg total) by mouth 2 (two) times daily. Patient not taking: Reported on 02/14/2016 08/29/15   Sharman Cheek, MD  traZODone (DESYREL) 25 mg TABS tablet Take 0.5 tablets (25 mg total) by mouth at bedtime as needed for sleep. Patient not taking: Reported on 02/14/2016 06/23/14   Kendrick Fries, NP    Allergies Penicillins  History reviewed. No pertinent family history.  Social History Social History  Substance Use Topics  . Smoking status: Never Smoker  . Smokeless tobacco: Never Used  . Alcohol use No    Review of Systems  Constitutional: No fever/chills Eyes: No visual changes. ENT: No sore throat. Cardiovascular: Denies chest pain. Respiratory: Denies shortness of breath. Gastrointestinal: No abdominal pain.  No nausea, no vomiting.  No diarrhea.  No constipation. Genitourinary: Negative for dysuria. Musculoskeletal: Negative for back pain. Skin: Negative  for rash. Neurological: Negative for headaches, focal weakness   ____________________________________________   PHYSICAL EXAM:  VITAL SIGNS: ED Triage Vitals  Enc Vitals Group     BP 08/26/17 1334 (!) 162/93     Pulse Rate 08/26/17 1334 (!) 103     Resp 08/26/17 1334 18     Temp 08/26/17 1334 98.7 F (37.1 C)     Temp Source  08/26/17 1334 Oral     SpO2 08/26/17 1334 98 %     Weight 08/26/17 1331 (!) 384 lb (174.2 kg)     Height 08/26/17 1331  (1.803 m)     Head Circumference --      Peak Flow --      Pain Score 08/26/17 1330 0     Pain Loc --      Pain Edu? --      Excl. in GC? --     Constitutional: Alert and oriented. Well appearing and in no acute distress. Eyes: Conjunctivae are normal.  Head: Atraumatic. Nose: No congestion/rhinnorhea. Mouth/Throat: Mucous membranes are moist.  Oropharynx non-erythematous. Neck: No stridor. Cardiovascular: Normal rate, regular rhythm. Grossly normal heart sounds.  Good peripheral circulation. Respiratory: Normal respiratory effort.  No retractions. Lungs CTAB. Gastrointestinal: Soft and nontender. No distention. No abdominal bruits. No CVA tenderness. Musculoskeletal: No lower extremity tenderness slight trace edema.  No joint effusions. Neurologic:  Normal speech and language. No gross focal neurologic deficits are appreciated.  Skin:  Skin is warm, dry and intact. No rash noted. Psychiatric: Mood and affect are normal. Speech and behavior are normal.  ____________________________________________   LABS (all labs ordered are listed, but only abnormal results are displayed)  Labs Reviewed  COMPREHENSIVE METABOLIC PANEL - Abnormal; Notable for the following:       Result Value   Sodium 132 (*)    CO2 19 (*)    Glucose, Bld 411 (*)    AST 83 (*)    ALT 69 (*)    All other components within normal limits  ACETAMINOPHEN LEVEL - Abnormal; Notable for the following:    Acetaminophen (Tylenol), Serum <10 (*)    All other components within normal limits  ETHANOL  SALICYLATE LEVEL  CBC  URINE DRUG SCREEN, QUALITATIVE ([facility] ONLY)   ____________________________________________  EKG   ____________________________________________  RADIOLOGY   ____________________________________________   PROCEDURES  Procedure(s) performed:    Procedures  Critical Care performed:   ____________________________________________   INITIAL IMPRESSION / ASSESSMENT AND PLAN / ED COURSE  Pertinent labs & imaging results that were available during my care of the patient were reviewed by me and considered in my medical decision making (see chart for details).        ____________________________________________   FINAL CLINICAL IMPRESSION(S) / ED DIAGNOSES  Final diagnoses:  Hearing voices      NEW MEDICATIONS STARTED DURING THIS VISIT:  New Prescriptions   No medications on file     Note:  This document was prepared using Dragon voice recognition software and may include unintentional dictation errors.    Arnaldo Natal, MD 08/26/17 [phone]

## 2017-08-26 NOTE — ED Notes (Signed)
Grandmother took clothes home

## 2017-08-26 NOTE — ED Triage Notes (Signed)
Pt arrives with grandmother, per grandmother she received a text from pts friend about suicidal thoughts, pt states she is hearing voices telling her to hurt herself, denies any attempts
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
## 2017-08-26 NOTE — ED Notes (Signed)

## 2017-08-26 NOTE — ED Notes (Signed)
Grandmother called to pick patient up as she is for discharge. She states she will be here shortly.

## 2017-12-06 ENCOUNTER — Emergency Department
Admission: EM | Admit: 2017-12-06 | Discharge: 2017-12-06 | Disposition: A | Discharge status: Home / Self Care | Payer: Medicaid Other | Attending: Emergency Medicine | Admitting: Emergency Medicine

## 2017-12-06 ENCOUNTER — Other Ambulatory Visit: Payer: Self-pay

## 2017-12-06 ENCOUNTER — Encounter: Payer: Self-pay | Admitting: Emergency Medicine

## 2017-12-06 DIAGNOSIS — R739 Hyperglycemia, unspecified: Secondary | ICD-10-CM

## 2017-12-06 DIAGNOSIS — Z7984 Long term (current) use of oral hypoglycemic drugs: Secondary | ICD-10-CM | POA: Diagnosis not present

## 2017-12-06 DIAGNOSIS — J45909 Unspecified asthma, uncomplicated: Secondary | ICD-10-CM | POA: Diagnosis not present

## 2017-12-06 DIAGNOSIS — F3164 Bipolar disorder, current episode mixed, severe, with psychotic features: Secondary | ICD-10-CM | POA: Insufficient documentation

## 2017-12-06 DIAGNOSIS — Z79899 Other long term (current) drug therapy: Secondary | ICD-10-CM | POA: Diagnosis not present

## 2017-12-06 DIAGNOSIS — R55 Syncope and collapse: Secondary | ICD-10-CM

## 2017-12-06 DIAGNOSIS — E1165 Type 2 diabetes mellitus with hyperglycemia: Secondary | ICD-10-CM | POA: Diagnosis not present

## 2017-12-06 DIAGNOSIS — I10 Essential (primary) hypertension: Secondary | ICD-10-CM | POA: Insufficient documentation

## 2017-12-06 HISTORY — DX: Essential (primary) hypertension: I10

## 2017-12-06 LAB — BASIC METABOLIC PANEL
ANION GAP: 14 (ref 5–15)
BUN: 14 mg/dL (ref 6–20)
CO2: 17 mmol/L — ABNORMAL LOW (ref 22–32)
CREATININE: 0.9 mg/dL (ref 0.44–1.00)
Calcium: 9.4 mg/dL (ref 8.9–10.3)
Chloride: 100 mmol/L — ABNORMAL LOW (ref 101–111)
GFR calc non Af Amer: >60 mL/min (ref >60)
GLUCOSE: 420 mg/dL — AB (ref 65–99)
POTASSIUM: 5 mmol/L (ref 3.5–5.1)
Sodium: 131 mmol/L — ABNORMAL LOW (ref 135–145)

## 2017-12-06 LAB — URINALYSIS, COMPLETE (UACMP) WITH MICROSCOPIC
Bilirubin Urine: NEGATIVE
Ketones, ur: 5 mg/dL — AB
Nitrite: NEGATIVE
PH: 6 (ref 5.0–8.0)
Protein, ur: NEGATIVE mg/dL
Specific Gravity, Urine: 1.025 (ref 1.005–1.030)

## 2017-12-06 LAB — CBC
HCT: 44.3 % (ref 35.0–47.0)
Hemoglobin: 15 g/dL (ref 12.0–16.0)
MCH: 29.5 pg (ref 26.0–34.0)
MCHC: 33.9 g/dL (ref 32.0–36.0)
MCV: 87.2 fL (ref 80.0–100.0)
PLATELETS: 220 10*3/uL (ref 150–440)
RBC: 5.08 MIL/uL (ref 3.80–5.20)
RDW: 13.2 % (ref 11.5–14.5)
WBC: 10.4 10*3/uL (ref 3.6–11.0)

## 2017-12-06 LAB — GLUCOSE, CAPILLARY
Glucose-Capillary: 413 mg/dL — ABNORMAL HIGH (ref 65–99)
Glucose-Capillary: 332 mg/dL — ABNORMAL HIGH (ref 65–99)
Glucose-Capillary: 316 mg/dL — ABNORMAL HIGH (ref 65–99)

## 2017-12-06 LAB — BLOOD GAS, VENOUS
PATIENT TEMPERATURE: 37
PCO2 VEN: 33 mmHg — AB (ref 44.0–60.0)
PH VEN: 7.42 (ref 7.250–7.430)
PO2 VEN: 81 mmHg — AB (ref 32.0–45.0)

## 2017-12-06 LAB — POCT PREGNANCY, URINE: Preg Test, Ur: NEGATIVE

## 2017-12-06 MED ORDER — SODIUM CHLORIDE 0.9 % IV BOLUS (SEPSIS)
1000.0000 mL | Freq: Once | INTRAVENOUS | Status: AC
  Administered 2017-12-06: 1000 mL via INTRAVENOUS

## 2017-12-06 MED ORDER — KETOROLAC TROMETHAMINE 30 MG/ML IJ SOLN
30.0000 mg | Freq: Once | INTRAMUSCULAR | Status: AC
  Administered 2017-12-06: 30 mg via INTRAVENOUS
  Filled 2017-12-06: qty 1

## 2017-12-06 NOTE — ED Triage Notes (Signed)
Pt to ED via EMS from home c/o hyperglycemia and near syncope.  Per EMS CBG 42, 151/92 BP, hx of HTN, DBM, asthma.  Takes metformin and was to start glipizide but hasn't yet.  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

## 2017-12-06 NOTE — Discharge Instructions (Signed)
Continue to take your regular metformin, and start the new medication (glipizide) tomorrow as discussed.  Return to the emergency department for new, worsening, or persistent high blood sugar readings especially higher than the 300s, weakness or lightheadedness, fever or chills, vomiting or diarrhea, or any other new or worsening symptoms that concern you.  You should follow-up with your primary care within the next 1-2 weeks.

## 2017-12-06 NOTE — ED Provider Notes (Signed)
[facility] Emergency Department Provider Note ____________________________________________   First MD Initiated Contact with Patient 12/06/17 1752     (approximate)  I have reviewed the triage vital signs and the nursing notes.   HISTORY  Chief Complaint Hyperglycemia and Near Syncope    HPI Cassandra Flynn is a 21 y.o. female with a history of diabetes, asthma, and hypertension who presents with hyperglycemia, unknown onset, measured to 400s by EMS and on fingerstick here, and associated with near syncope.  Patient states that she takes metformin for diabetes and was to start glipizide but has not started yet.  She has not on any insulin, and has no history of DKA.  The near syncope began his lightheadedness while she was at work, and patient states she went home and went to sleep.  When she woke up she stood up and then fell onto her back but did not hit her head.  Past Medical History:  Diagnosis Date  . Asthma   . Diabetes mellitus without complication (HCC)   . Hypertension   . Sleep apnea     Patient Active Problem List   Diagnosis Date Noted  . Abdominal pain affecting pregnancy, antepartum 03/24/2017  . Type 2 diabetes mellitus affecting pregnancy in third trimester, antepartum 03/24/2017  . Morbid obesity (HCC) 03/24/2017  . Labor and delivery, indication for care 02/22/2016  . Post traumatic stress disorder (PTSD) 06/16/2014  . Bipolar I disorder, most recent episode (or current) mixed, severe, specified as with psychotic behavior 06/16/2014  . ODD (oppositional defiant disorder) 06/16/2014    Past Surgical History:  Procedure Laterality Date  . CESAREAN SECTION    . CHOLECYSTECTOMY    . SKIN GRAFT    . TONSILLECTOMY      Prior to Admission medications   Medication Sig Start Date End Date Taking? Authorizing Provider  albuterol (PROVENTIL HFA;VENTOLIN HFA) 108 (90 Base) MCG/ACT inhaler Inhale 2 puffs into the lungs every 6 (six)  hours as needed for wheezing or shortness of breath. 08/11/17   Willy Eddyobinson, Patrick, MD  HYDROcodone-acetaminophen (NORCO) 5-325 MG tablet Take 1 tablet by mouth every 6 (six) hours as needed for moderate pain. 08/11/17   Irean HongSung, Jade J, MD  ibuprofen (ADVIL,MOTRIN) 800 MG tablet Take 1 tablet (800 mg total) by mouth every 8 (eight) hours as needed for moderate pain. 08/11/17   Irean HongSung, Jade J, MD  metFORMIN (GLUCOPHAGE) 500 MG tablet Take 2 tablets (1,000 mg total) by mouth 2 (two) times daily with a meal. 03/24/17   Linzie CollinEvans, David James, MD  norelgestromin-ethinyl estradiol (ORTHO EVRA) 150-35 MCG/24HR transdermal patch Place 1 patch onto the skin once a week. Reported on 02/14/2016    [provider]  OLANZapine (ZYPREXA) 5 MG tablet Take 1 tablet (5 mg total) by mouth at bedtime. 08/26/17   Willy Eddyobinson, Patrick, MD  ondansetron (ZOFRAN ODT) 8 MG disintegrating tablet Take 1 tablet (8 mg total) by mouth every 8 (eight) hours as needed for nausea or vomiting. Patient not taking: Reported on 02/14/2016 08/29/15   Sharman CheekStafford, Phillip, MD  oxyCODONE-acetaminophen (PERCOCET) 5-325 MG tablet Take 1-2 tablets by mouth every 6 (six) hours as needed. 08/12/17   Emily FilbertWilliams, Jonathan E, MD  ranitidine (ZANTAC) 150 MG capsule Take 1 capsule (150 mg total) by mouth 2 (two) times daily. Patient not taking: Reported on 02/14/2016 08/29/15   Sharman CheekStafford, Phillip, MD  traZODone (DESYREL) 25 mg TABS tablet Take 0.5 tablets (25 mg total) by mouth at bedtime as needed  for sleep. Patient not taking: Reported on 02/14/2016 06/23/14   Kendrick Fries, NP    Allergies Penicillins  History reviewed. No pertinent family history.  Social History Social History   Tobacco Use  . Smoking status: Never Smoker  . Smokeless tobacco: Never Used  Substance Use Topics  . Alcohol use: No  . Drug use: No    Review of Systems  Constitutional: No fever. Eyes: Positive for blurred vision. ENT: No sore throat. Cardiovascular: Denies chest  pain. Respiratory: Denies shortness of breath. Gastrointestinal: No nausea, no vomiting.  Genitourinary: Negative for dysuria.  Musculoskeletal: Negative for back pain. Skin: Negative for rash. Neurological: Negative for headache.   ____________________________________________   PHYSICAL EXAM:  VITAL SIGNS: ED Triage Vitals  Enc Vitals Group     BP 12/06/17 1732 (!) 173/104     Pulse Rate 12/06/17 1732 (!) 120     Resp 12/06/17 1732 15     Temp 12/06/17 1732 98.6 F (37 C)     Temp Source 12/06/17 1732 Oral     SpO2 12/06/17 1732 98 %     Weight 12/06/17 1734 (!) 374 lb (169.6 kg)     Height 12/06/17 1734 6' (1.829 m)     Head Circumference --      Peak Flow --      Pain Score 12/06/17 1732 9     Pain Loc --      Pain Edu? --      Excl. in GC? --     Constitutional: Alert but mildly confused, and relatively comfortable appearing. Eyes: Conjunctivae are normal.  EOMI.  PERRLA. Head: Atraumatic. Nose: No congestion/rhinnorhea. Mouth/Throat: Mucous membranes are slightly dry.   Neck: Normal range of motion.  Cardiovascular: Tachycardic, regular rhythm. Grossly normal heart sounds.  Good peripheral circulation. Respiratory: Normal respiratory effort.  No retractions. Lungs CTAB. Gastrointestinal: Soft and nontender. No distention.  Genitourinary: No flank tenderness. Musculoskeletal: No lower extremity edema.  Extremities warm and well perfused.  Neurologic:  Normal speech and language.  Motor intact in all extremities.  No gross focal neurologic deficits are appreciated.  Skin:  Skin is warm and dry. No rash noted. Psychiatric: Mood and affect are normal. Speech and behavior are normal.  ____________________________________________   LABS (all labs ordered are listed, but only abnormal results are displayed)  Labs Reviewed  BASIC METABOLIC PANEL - Abnormal; Notable for the following components:      Result Value   Sodium 131 (*)    Chloride 100 (*)    CO2 17  (*)    Glucose, Bld 420 (*)    All other components within normal limits  URINALYSIS, COMPLETE (UACMP) WITH MICROSCOPIC - Abnormal; Notable for the following components:   Color, Urine STRAW (*)    APPearance CLEAR (*)    Glucose, UA >=500 (*)    Hgb urine dipstick SMALL (*)    Ketones, ur 5 (*)    Leukocytes, UA SMALL (*)    Bacteria, UA RARE (*)    Squamous Epithelial / LPF 0-5 (*)    All other components within normal limits  GLUCOSE, CAPILLARY - Abnormal; Notable for the following components:   Glucose-Capillary 413 (*)    All other components within normal limits  BLOOD GAS, VENOUS - Abnormal; Notable for the following components:   pCO2, Ven 33 (*)    pO2, Ven 81.0 (*)    All other components within normal limits  GLUCOSE, CAPILLARY - Abnormal; Notable for the following  components:   Glucose-Capillary 332 (*)    All other components within normal limits  CBC  CBG MONITORING, ED  POC URINE PREG, ED  POCT PREGNANCY, URINE   ____________________________________________  EKG  ED ECG REPORT I, Dionne BucySebastian Easton Sivertson, the attending physician, personally viewed and interpreted this ECG.  Date: 12/06/2017 EKG Time: 1732 Rate: 123 Rhythm: Sinus tachycardia QRS Axis: Borderline right axis Intervals: normal ST/T Wave abnormalities: Nonspecific T wave abnormalities Narrative Interpretation: no evidence of acute ischemia; no significant change when compared to EKG of 08/11/2017  ____________________________________________  RADIOLOGY    ____________________________________________   PROCEDURES  Procedure(s) performed: No    Critical Care performed: No ____________________________________________   INITIAL IMPRESSION / ASSESSMENT AND PLAN / ED COURSE  Pertinent labs & imaging results that were available during my care of the patient were reviewed by me and considered in my medical decision making (see chart for details).  21 year old female with past medical  history as noted above presents with hyperglycemia as well as an episode of near syncope with a fall this afternoon.  Glucose was measured to the 400s by EMS at her home as well as here in the ED.  Per RN, prior to my entering the room the patient had another near syncopal episode while in the stretcher.  I reviewed the past medical records in epic; the patient was most recently seen in for psychiatric symptoms in September of this year.  She has no history of DKA.  On exam, the patient is tachycardic and hypertensive, but the other vital signs are normal.  She is alert, slightly confused appearing, but answering all questions appropriately and with a nonfocal neuro exam.  There is no evidence of trauma.  Exam otherwise unremarkable.  Presentation is consistent with hyperglycemia and mild dehydration; there is no clinical evidence for DKA.  Unclear if the hypoglycemia is due to medication noncompliance, progression of her disease, or secondary cause such as infection.  Plan: IV fluids to bring down the glucose, labs to rule out DKA, UA, and reassess.    ----------------------------------------- 8:13 PM on 12/06/2017 -----------------------------------------  Patient is now much more alert and appears comfortable.  Glucose down to the 300s after the first liter of fluid.  We will give an additional liter of normal saline and reassess.  Vital signs are improving.  Patient's workup shows no acidosis, no elevated and urine.  No evidence of DKA clinically.  ----------------------------------------- 8:45 PM on 12/06/2017 -----------------------------------------  Glucose down to low 300s, patient continues to feel well.  Vital signs improved.  At this time the patient is safe for discharge home.  I gave thorough discharge instructions and return precautions, and patient states that she will start the glipizide tomorrow and follow-up with her primary  care.  ____________________________________________   FINAL CLINICAL IMPRESSION(S) / ED DIAGNOSES  Final diagnoses:  Hyperglycemia  Near syncope      NEW MEDICATIONS STARTED DURING THIS VISIT:  This SmartLink is deprecated. Use AVSMEDLIST instead to display the medication list for a patient.   Note:  This document was prepared using Dragon voice recognition software and may include unintentional dictation errors.     Dionne BucySiadecki, Alera Quevedo, MD 12/06/17 2045

## 2018-03-08 ENCOUNTER — Encounter: Payer: Self-pay | Admitting: Emergency Medicine

## 2018-03-08 ENCOUNTER — Emergency Department
Admission: EM | Admit: 2018-03-08 | Discharge: 2018-03-08 | Disposition: A | Discharge status: Home / Self Care | Payer: Medicaid Other | Attending: Emergency Medicine | Admitting: Emergency Medicine

## 2018-03-08 ENCOUNTER — Other Ambulatory Visit: Payer: Self-pay

## 2018-03-08 DIAGNOSIS — Z7984 Long term (current) use of oral hypoglycemic drugs: Secondary | ICD-10-CM | POA: Insufficient documentation

## 2018-03-08 DIAGNOSIS — J45909 Unspecified asthma, uncomplicated: Secondary | ICD-10-CM | POA: Insufficient documentation

## 2018-03-08 DIAGNOSIS — R197 Diarrhea, unspecified: Secondary | ICD-10-CM | POA: Insufficient documentation

## 2018-03-08 DIAGNOSIS — E119 Type 2 diabetes mellitus without complications: Secondary | ICD-10-CM | POA: Diagnosis not present

## 2018-03-08 DIAGNOSIS — R112 Nausea with vomiting, unspecified: Secondary | ICD-10-CM | POA: Insufficient documentation

## 2018-03-08 DIAGNOSIS — R109 Unspecified abdominal pain: Secondary | ICD-10-CM | POA: Diagnosis present

## 2018-03-08 LAB — COMPREHENSIVE METABOLIC PANEL
ALT: 46 U/L (ref 14–54)
ANION GAP: 15 (ref 5–15)
AST: 76 U/L — ABNORMAL HIGH (ref 15–41)
Albumin: 3.8 g/dL (ref 3.5–5.0)
Alkaline Phosphatase: 80 U/L (ref 38–126)
BILIRUBIN TOTAL: 0.9 mg/dL (ref 0.3–1.2)
BUN: 11 mg/dL (ref 6–20)
CO2: 19 mmol/L — ABNORMAL LOW (ref 22–32)
Calcium: 8.6 mg/dL — ABNORMAL LOW (ref 8.9–10.3)
Chloride: 100 mmol/L — ABNORMAL LOW (ref 101–111)
Creatinine, Ser: 0.68 mg/dL (ref 0.44–1.00)
GFR calc Af Amer: >60 mL/min (ref >60)
Glucose, Bld: 306 mg/dL — ABNORMAL HIGH (ref 65–99)
POTASSIUM: 4.1 mmol/L (ref 3.5–5.1)
Sodium: 134 mmol/L — ABNORMAL LOW (ref 135–145)
TOTAL PROTEIN: 7.9 g/dL (ref 6.5–8.1)

## 2018-03-08 LAB — URINALYSIS, COMPLETE (UACMP) WITH MICROSCOPIC
BACTERIA UA: NONE SEEN
BILIRUBIN URINE: NEGATIVE
Glucose, UA: >=500 mg/dL — AB
Hgb urine dipstick: NEGATIVE
Ketones, ur: 20 mg/dL — AB
Leukocytes, UA: NEGATIVE
NITRITE: NEGATIVE
PH: 5 (ref 5.0–8.0)
Protein, ur: 30 mg/dL — AB
Specific Gravity, Urine: 1.025 (ref 1.005–1.030)

## 2018-03-08 LAB — CBC
HEMATOCRIT: 43.5 % (ref 35.0–47.0)
HEMOGLOBIN: 14.6 g/dL (ref 12.0–16.0)
MCH: 29.3 pg (ref 26.0–34.0)
MCHC: 33.6 g/dL (ref 32.0–36.0)
MCV: 87.2 fL (ref 80.0–100.0)
Platelets: 214 10*3/uL (ref 150–440)
RBC: 4.99 MIL/uL (ref 3.80–5.20)
RDW: 13.3 % (ref 11.5–14.5)
WBC: 7.2 10*3/uL (ref 3.6–11.0)

## 2018-03-08 LAB — GLUCOSE, CAPILLARY: GLUCOSE-CAPILLARY: 206 mg/dL — AB (ref 65–99)

## 2018-03-08 LAB — LIPASE, BLOOD: Lipase: 28 U/L (ref 11–51)

## 2018-03-08 LAB — POCT PREGNANCY, URINE: Preg Test, Ur: NEGATIVE

## 2018-03-08 MED ORDER — SODIUM CHLORIDE 0.9 % IV BOLUS
1000.0000 mL | Freq: Once | INTRAVENOUS | Status: AC
  Administered 2018-03-08: 1000 mL via INTRAVENOUS

## 2018-03-08 MED ORDER — ONDANSETRON HCL 4 MG PO TABS: 4.0000 mg | ORAL_TABLET | Freq: Three times a day (TID) | ORAL | 0 refills | Status: AC | PRN

## 2018-03-08 MED ORDER — DICYCLOMINE HCL 10 MG PO CAPS: 10.0000 mg | ORAL_CAPSULE | Freq: Three times a day (TID) | ORAL | 0 refills | Status: DC | PRN

## 2018-03-08 MED ORDER — FAMOTIDINE IN NACL 20-0.9 MG/50ML-% IV SOLN
20.0000 mg | Freq: Once | INTRAVENOUS | Status: AC
Start: 2018-03-08 — End: 2018-03-08
  Administered 2018-03-08: 20 mg via INTRAVENOUS
  Filled 2018-03-08 (×2): qty 50

## 2018-03-08 MED ORDER — DICYCLOMINE HCL 10 MG PO CAPS
10.0000 mg | ORAL_CAPSULE | Freq: Once | ORAL | Status: AC
Start: 2018-03-08 — End: 2018-03-08
  Administered 2018-03-08: 10 mg via ORAL
  Filled 2018-03-08: qty 1

## 2018-03-08 MED ORDER — METOCLOPRAMIDE HCL 5 MG/ML IJ SOLN
10.0000 mg | Freq: Once | INTRAMUSCULAR | Status: AC
  Administered 2018-03-08: 10 mg via INTRAVENOUS
  Filled 2018-03-08: qty 2

## 2018-03-08 NOTE — ED Triage Notes (Signed)
Says sudden onset generalized abd pain at 3am that feels like contractions and burns.  als o vomiting and diarrhea.

## 2018-03-08 NOTE — ED Provider Notes (Signed)
[facility] Emergency Department Provider Note ____________________________________________   First MD Initiated Contact with Patient 03/08/18 1410     (approximate)  I have reviewed the triage vital signs and the nursing notes.   HISTORY  Chief Complaint Abdominal Pain; Emesis; and Diarrhea    HPI Cassandra Flynn is a 22 y.o. female with past medical history of DM, hypertension, sleep apnea, and other PMH as noted below who presents with abdominal pain for approximately the last 11 hours, acute onset last night, generalized in location, described as crampy and burning.  It is associated with several episodes of vomiting, as well as multiple episodes of diarrhea, both which are nonbloody.  Patient denies any urinary symptoms.  She takes glipizide for her diabetes, but states she did not take any of her medications today.  No sick contacts except for a grandmother with the flu, and no travel, antibiotic use, or unusual foods.   Past Medical History:  Diagnosis Date  . Asthma   . Diabetes mellitus without complication (HCC)   . Hypertension   . Sleep apnea     Patient Active Problem List   Diagnosis Date Noted  . Abdominal pain affecting pregnancy, antepartum 03/24/2017  . Type 2 diabetes mellitus affecting pregnancy in third trimester, antepartum 03/24/2017  . Morbid obesity (HCC) 03/24/2017  . Labor and delivery, indication for care 02/22/2016  . Post traumatic stress disorder (PTSD) 06/16/2014  . Bipolar I disorder, most recent episode (or current) mixed, severe, specified as with psychotic behavior 06/16/2014  . ODD (oppositional defiant disorder) 06/16/2014    Past Surgical History:  Procedure Laterality Date  . CESAREAN SECTION    . CHOLECYSTECTOMY    . SKIN GRAFT    . TONSILLECTOMY      Prior to Admission medications   Medication Sig Start Date End Date Taking? Authorizing Provider  albuterol (PROVENTIL HFA;VENTOLIN HFA) 108 (90  Base) MCG/ACT inhaler Inhale 2 puffs into the lungs every 6 (six) hours as needed for wheezing or shortness of breath. 08/11/17   Willy Eddyobinson, Patrick, MD  dicyclomine (BENTYL) 10 MG capsule Take 1 capsule (10 mg total) by mouth 3 (three) times daily as needed for up to 5 days for spasms. 03/08/18 03/13/18  Dionne BucySiadecki, Lashauna Arpin, MD  HYDROcodone-acetaminophen (NORCO) 5-325 MG tablet Take 1 tablet by mouth every 6 (six) hours as needed for moderate pain. 08/11/17   Irean HongSung, Jade J, MD  ibuprofen (ADVIL,MOTRIN) 800 MG tablet Take 1 tablet (800 mg total) by mouth every 8 (eight) hours as needed for moderate pain. 08/11/17   Irean HongSung, Jade J, MD  metFORMIN (GLUCOPHAGE) 500 MG tablet Take 2 tablets (1,000 mg total) by mouth 2 (two) times daily with a meal. 03/24/17   Linzie CollinEvans, David James, MD  norelgestromin-ethinyl estradiol (ORTHO EVRA) 150-35 MCG/24HR transdermal patch Place 1 patch onto the skin once a week. Reported on 02/14/2016    [provider]  OLANZapine (ZYPREXA) 5 MG tablet Take 1 tablet (5 mg total) by mouth at bedtime. 08/26/17   Willy Eddyobinson, Patrick, MD  ondansetron (ZOFRAN ODT) 8 MG disintegrating tablet Take 1 tablet (8 mg total) by mouth every 8 (eight) hours as needed for nausea or vomiting. Patient not taking: Reported on 02/14/2016 08/29/15   Sharman CheekStafford, Phillip, MD  ondansetron [facility](ZOFRAN) 4 MG tablet Take 1 tablet (4 mg total) by mouth every 8 (eight) hours as needed for up to 5 days for nausea or vomiting. 03/08/18 03/13/18  Dionne BucySiadecki, Tigran Haynie, MD  oxyCODONE-acetaminophen (PERCOCET) [phone]  MG tablet Take 1-2 tablets by mouth every 6 (six) hours as needed. 08/12/17   Emily Filbert, MD  ranitidine (ZANTAC) 150 MG capsule Take 1 capsule (150 mg total) by mouth 2 (two) times daily. Patient not taking: Reported on 02/14/2016 08/29/15   Sharman Cheek, MD  traZODone (DESYREL) 25 mg TABS tablet Take 0.5 tablets (25 mg total) by mouth at bedtime as needed for sleep. Patient not taking: Reported on 02/14/2016 06/23/14    Kendrick Fries, NP    Allergies Penicillins  No family history on file.  Social History Social History   Tobacco Use  . Smoking status: Never Smoker  . Smokeless tobacco: Never Used  Substance Use Topics  . Alcohol use: No  . Drug use: No    Review of Systems  Constitutional: No fever. Eyes: No redness. ENT: No sore throat. Cardiovascular: Denies chest pain. Respiratory: Denies shortness of breath. Gastrointestinal: Positive for nausea, vomiting, diarrhea..  Genitourinary: Negative for dysuria.  Musculoskeletal: Negative for back pain. Skin: Negative for rash. Neurological: Negative for headache.   ____________________________________________   PHYSICAL EXAM:  VITAL SIGNS: ED Triage Vitals  Enc Vitals Group     BP 03/08/18 1204 131/83     Pulse Rate 03/08/18 1201 (!) 120     Resp 03/08/18 1201 (!) 22     Temp 03/08/18 1201 99.9 F (37.7 C)     Temp Source 03/08/18 1201 Oral     SpO2 03/08/18 1201 99 %     Weight 03/08/18 1202 (!) 363 lb (164.7 kg)     Height 03/08/18 1202 6' (1.829 m)     Head Circumference --      Peak Flow --      Pain Score 03/08/18 1202 10     Pain Loc --      Pain Edu? --      Excl. in GC? --     Constitutional: Alert and oriented.  Relatively comfortable appearing and in no acute distress. Eyes: Conjunctivae are normal.  No scleral icterus Head: Atraumatic. Nose: No congestion/rhinnorhea. Mouth/Throat: Mucous membranes are slightly dry.   Neck: Normal range of motion.  Cardiovascular: Normal rate, regular rhythm. Grossly normal heart sounds.  Good peripheral circulation. Respiratory: Normal respiratory effort.  No retractions. Lungs CTAB. Gastrointestinal: Soft with very mild diffuse tenderness. No distention.  Genitourinary: No flank tenderness. Musculoskeletal: Extremities warm and well perfused.  Neurologic:  Normal speech and language. No gross focal neurologic deficits are appreciated.  Skin:  Skin is warm and dry.  No rash noted. Psychiatric: Mood and affect are normal. Speech and behavior are normal.  ____________________________________________   LABS (all labs ordered are listed, but only abnormal results are displayed)  Labs Reviewed  COMPREHENSIVE METABOLIC PANEL - Abnormal; Notable for the following components:      Result Value   Sodium 134 (*)    Chloride 100 (*)    CO2 19 (*)    Glucose, Bld 306 (*)    Calcium 8.6 (*)    AST 76 (*)    All other components within normal limits  URINALYSIS, COMPLETE (UACMP) WITH MICROSCOPIC - Abnormal; Notable for the following components:   Color, Urine YELLOW (*)    APPearance CLEAR (*)    Glucose, UA >=500 (*)    Ketones, ur 20 (*)    Protein, ur 30 (*)    Squamous Epithelial / LPF 0-5 (*)    All other components within normal limits  GLUCOSE, CAPILLARY - Abnormal; Notable  for the following components:   Glucose-Capillary 206 (*)    All other components within normal limits  LIPASE, BLOOD  CBC  POC URINE PREG, ED  POCT PREGNANCY, URINE  CBG MONITORING, ED   ____________________________________________  EKG   ____________________________________________  RADIOLOGY    ____________________________________________   PROCEDURES  Procedure(s) performed: No  Procedures  Critical Care performed: No ____________________________________________   INITIAL IMPRESSION / ASSESSMENT AND PLAN / ED COURSE  Pertinent labs & imaging results that were available during my care of the patient were reviewed by me and considered in my medical decision making (see chart for details).  22 year old female with history of diabetes and other PMH as noted below presents with acute onset of crampy abdominal pain as well as vomiting and diarrhea since last night.  On exam, vitals are normal except for tachycardia and borderline temperature, but the patient is otherwise relatively well-appearing and the abdomen is soft.  Initial labs reveal elevated  glucose but no significant anion gap.  There are some ketones in the urine.  Presentation is most consistent with viral gastroenteritis versus foodborne illness.  Hyperglycemia likely due to not taking medication today, as well as the underlying infection.  There is no evidence for DKA given that there is no significant anion gap.  Plan: IV fluids, symptomatic treatment with antiemetic and antispasmodic, and reassess.    ----------------------------------------- 4:27 PM on 03/08/2018 -----------------------------------------  Patient is feeling significantly better and has not had recurrent vomiting here.  Repeat glucose is improved to 200s.  The patient feels well and would like to go home.  Return precautions given, and she expresses understanding.  ____________________________________________   FINAL CLINICAL IMPRESSION(S) / ED DIAGNOSES  Final diagnoses:  Nausea vomiting and diarrhea      NEW MEDICATIONS STARTED DURING THIS VISIT:  New Prescriptions   DICYCLOMINE (BENTYL) 10 MG CAPSULE    Take 1 capsule (10 mg total) by mouth 3 (three) times daily as needed for up to 5 days for spasms.   ONDANSETRON (ZOFRAN) 4 MG TABLET    Take 1 tablet (4 mg total) by mouth every 8 (eight) hours as needed for up to 5 days for nausea or vomiting.     Note:  This document was prepared using Dragon voice recognition software and may include unintentional dictation errors.    Dionne Bucy, MD 03/08/18 [phone]

## 2018-03-08 NOTE — Discharge Instructions (Addendum)
Take the prescribed medications as needed if you have recurrent nausea, vomiting, or abdominal cramping.  Try to stay hydrated and drink small amounts of fluid often.  Take your diabetes medications as soon as you are able to.  Return to the ER for new, worsening, or persistent pain, vomiting, fevers, weakness, or any other new or worsening symptoms that concern you.

## 2018-07-21 IMAGING — CR DG FEMUR 2+V*R*
4 series · 4 of 4 positions shown · non-contrast
Comparison: None.

CLINICAL DATA: Fall.

EXAM:
RIGHT FEMUR 2 VIEWS

[femur ap (1 of 2)]
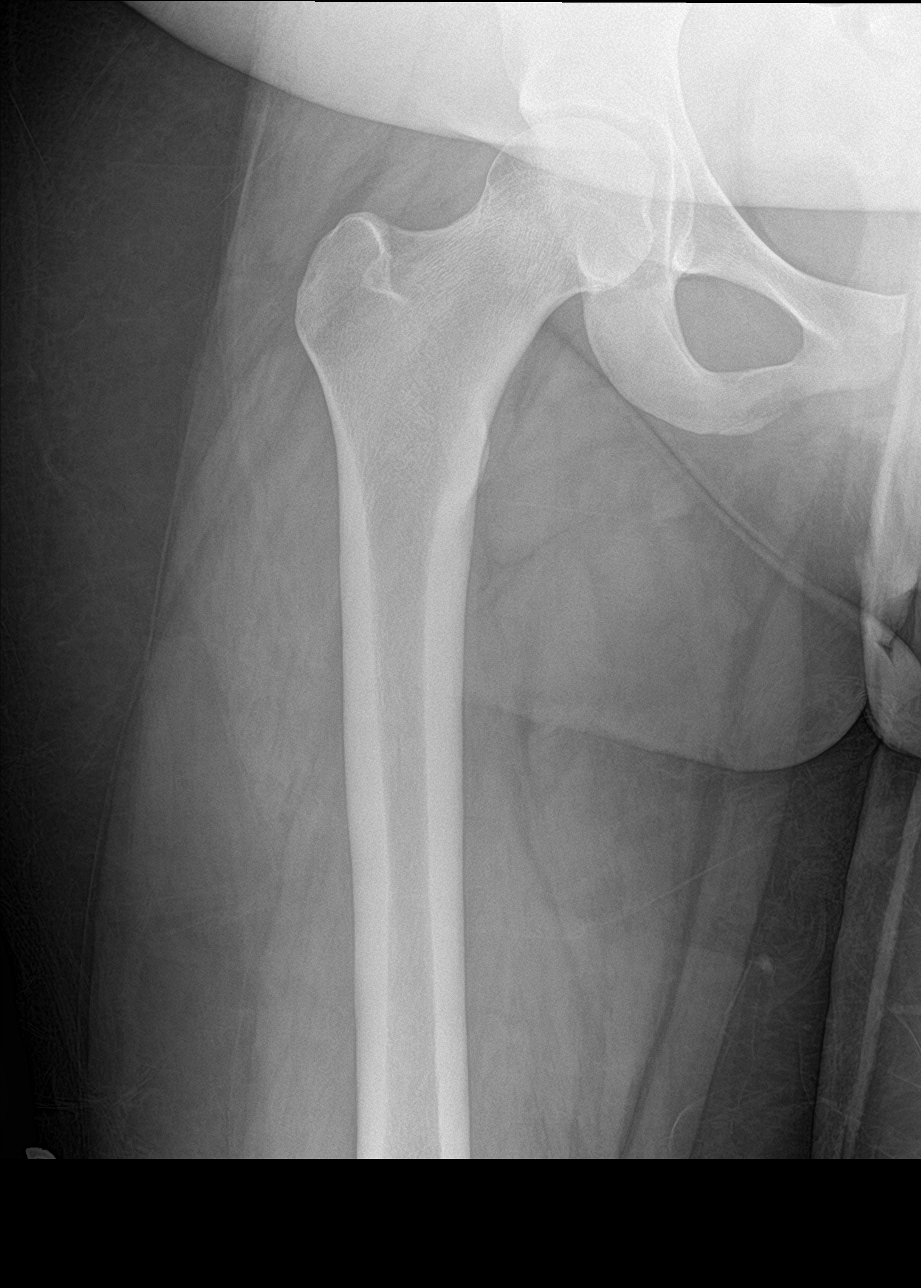

[femur ap (2 of 2)]
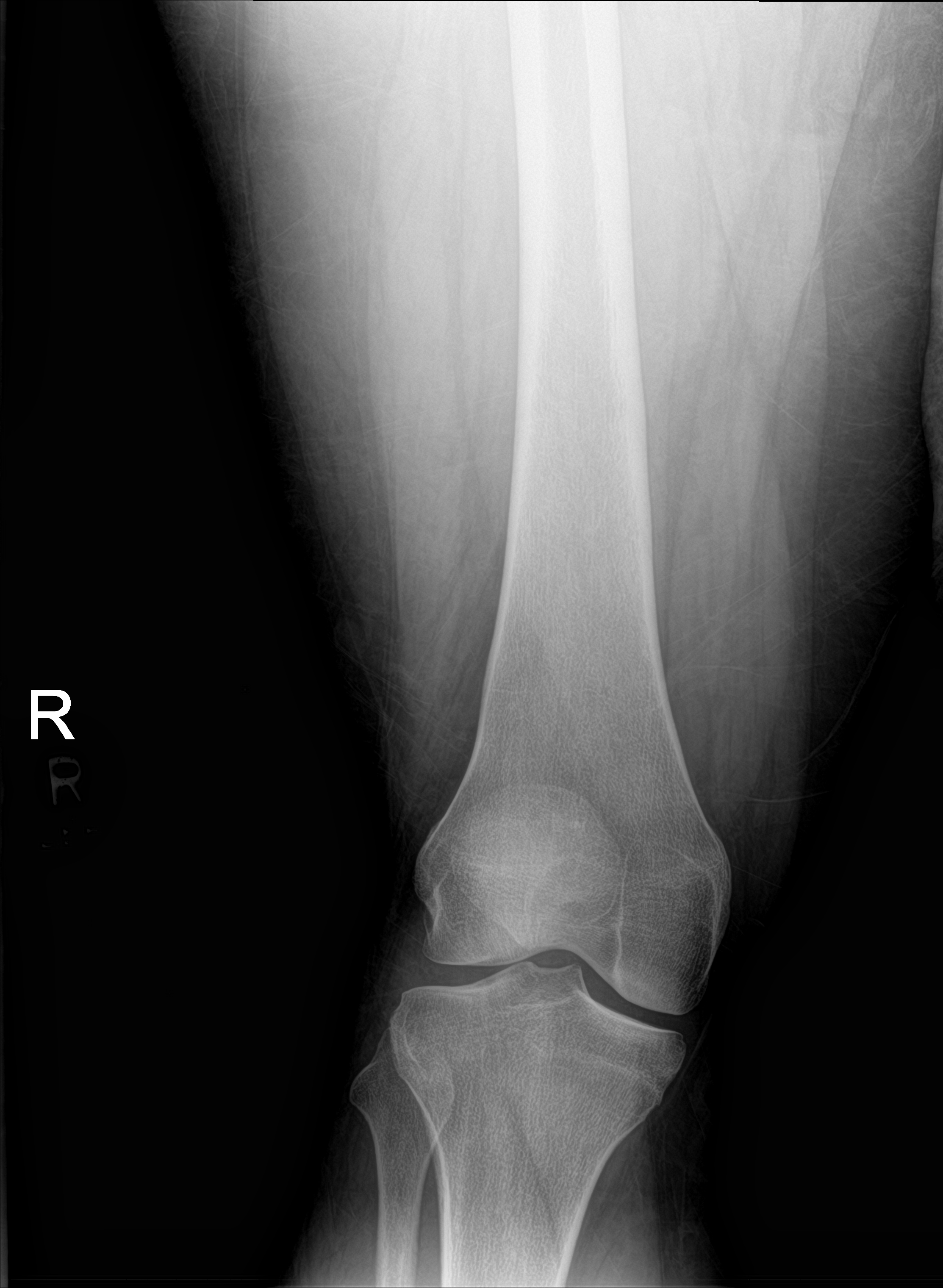

[femur lat (1 of 2)]
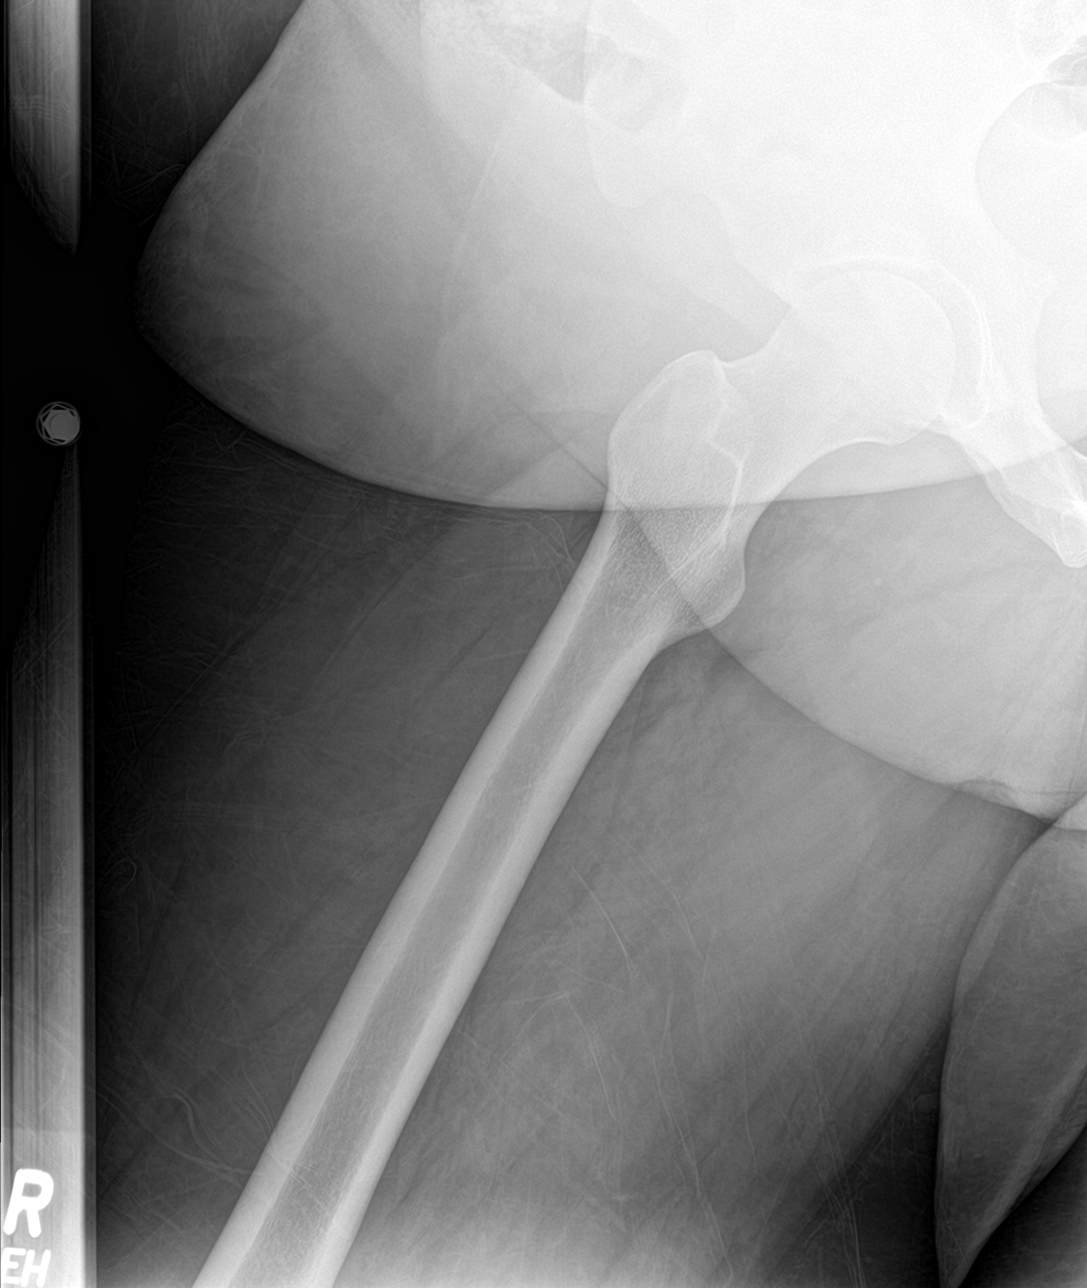

[femur lat (2 of 2)]
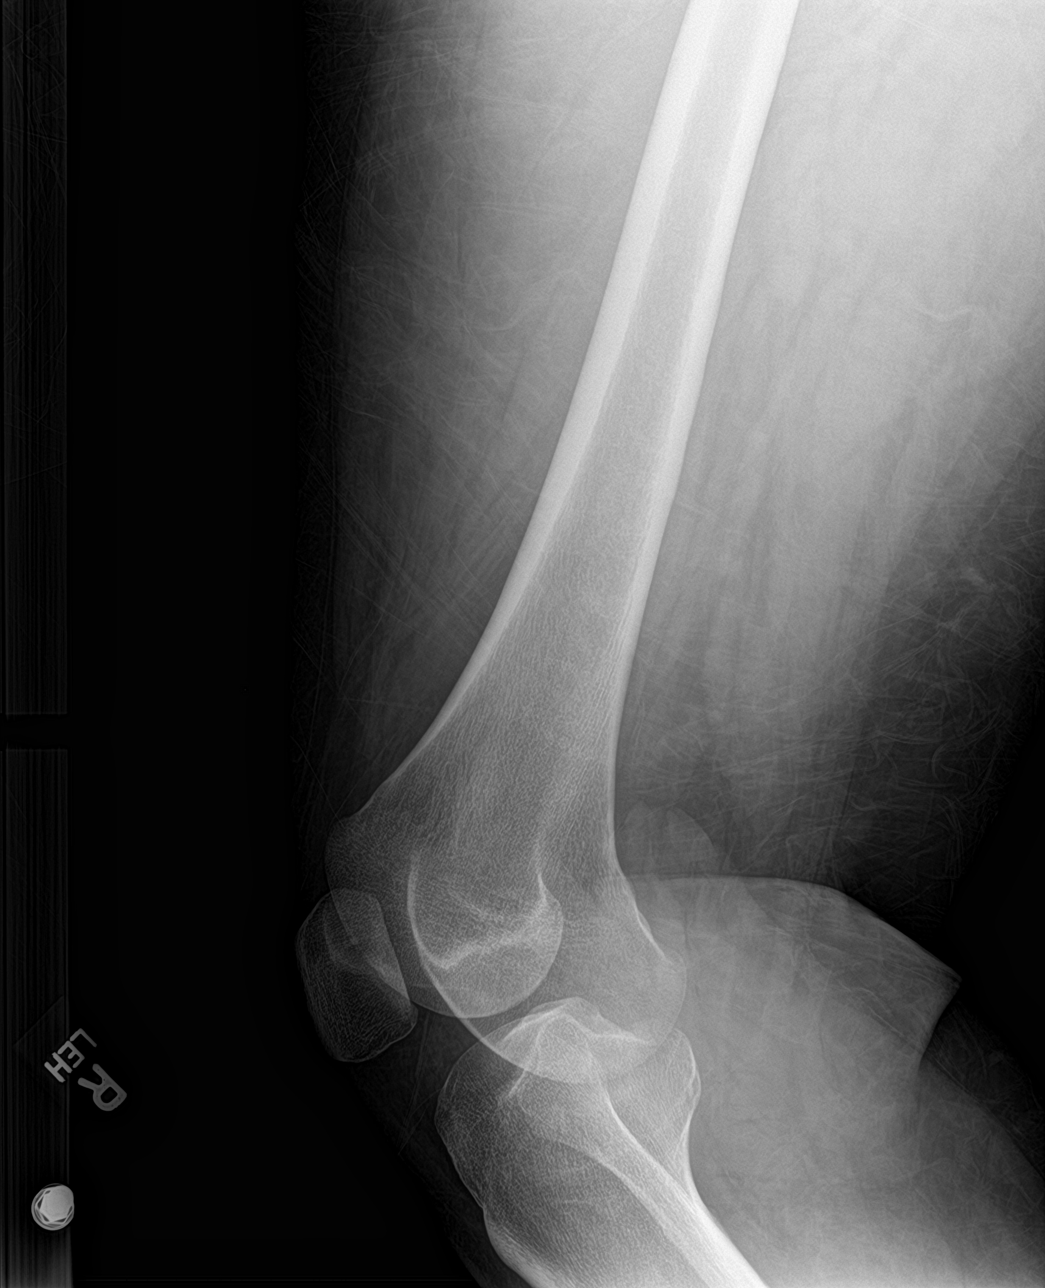

[4 of 4 positions shown; findings below may reference images not displayed]

FINDINGS: There is no evidence of fracture or other focal bone lesions. Soft
tissues are unremarkable.
IMPRESSION: Negative.

## 2018-11-11 ENCOUNTER — Emergency Department: Payer: Medicaid Other

## 2018-11-11 ENCOUNTER — Other Ambulatory Visit: Payer: Self-pay

## 2018-11-11 ENCOUNTER — Emergency Department
Admission: EM | Admit: 2018-11-11 | Discharge: 2018-11-11 | Disposition: A | Discharge status: Home / Self Care | Payer: Medicaid Other | Attending: Emergency Medicine | Admitting: Emergency Medicine

## 2018-11-11 DIAGNOSIS — R4789 Other speech disturbances: Secondary | ICD-10-CM | POA: Insufficient documentation

## 2018-11-11 DIAGNOSIS — J45909 Unspecified asthma, uncomplicated: Secondary | ICD-10-CM | POA: Diagnosis not present

## 2018-11-11 DIAGNOSIS — R519 Headache, unspecified: Secondary | ICD-10-CM

## 2018-11-11 DIAGNOSIS — I10 Essential (primary) hypertension: Secondary | ICD-10-CM | POA: Diagnosis not present

## 2018-11-11 DIAGNOSIS — H53133 Sudden visual loss, bilateral: Secondary | ICD-10-CM | POA: Insufficient documentation

## 2018-11-11 DIAGNOSIS — R51 Headache: Secondary | ICD-10-CM | POA: Insufficient documentation

## 2018-11-11 DIAGNOSIS — Z3202 Encounter for pregnancy test, result negative: Secondary | ICD-10-CM | POA: Insufficient documentation

## 2018-11-11 DIAGNOSIS — Z7984 Long term (current) use of oral hypoglycemic drugs: Secondary | ICD-10-CM | POA: Diagnosis not present

## 2018-11-11 DIAGNOSIS — E119 Type 2 diabetes mellitus without complications: Secondary | ICD-10-CM | POA: Insufficient documentation

## 2018-11-11 DIAGNOSIS — R4182 Altered mental status, unspecified: Secondary | ICD-10-CM | POA: Diagnosis present

## 2018-11-11 DIAGNOSIS — R739 Hyperglycemia, unspecified: Secondary | ICD-10-CM

## 2018-11-11 DIAGNOSIS — F8081 Childhood onset fluency disorder: Secondary | ICD-10-CM

## 2018-11-11 DIAGNOSIS — H543 Unqualified visual loss, both eyes: Secondary | ICD-10-CM

## 2018-11-11 LAB — CBC WITH DIFFERENTIAL/PLATELET
ABS IMMATURE GRANULOCYTES: 0.03 10*3/uL (ref 0.00–0.07)
BASOS ABS: 0 10*3/uL (ref 0.0–0.1)
Basophils Relative: 0 %
EOS PCT: 1 %
Eosinophils Absolute: 0.1 10*3/uL (ref 0.0–0.5)
HEMATOCRIT: 42.2 % (ref 36.0–46.0)
HEMOGLOBIN: 14.4 g/dL (ref 12.0–15.0)
Immature Granulocytes: 0 %
LYMPHS ABS: 3.6 10*3/uL (ref 0.7–4.0)
LYMPHS PCT: 38 %
MCH: 29.4 pg (ref 26.0–34.0)
MCHC: 34.1 g/dL (ref 30.0–36.0)
MCV: 86.3 fL (ref 80.0–100.0)
Monocytes Absolute: 0.6 10*3/uL (ref 0.1–1.0)
Monocytes Relative: 7 %
NEUTROS ABS: 5.1 10*3/uL (ref 1.7–7.7)
Neutrophils Relative %: 54 %
Platelets: 232 10*3/uL (ref 150–400)
RBC: 4.89 MIL/uL (ref 3.87–5.11)
RDW: 11.8 % (ref 11.5–15.5)
WBC: 9.4 10*3/uL (ref 4.0–10.5)
nRBC: 0 % (ref 0.0–0.2)

## 2018-11-11 LAB — URINALYSIS, COMPLETE (UACMP) WITH MICROSCOPIC
BACTERIA UA: NONE SEEN
BILIRUBIN URINE: NEGATIVE
Glucose, UA: >=500 mg/dL — AB
Hgb urine dipstick: NEGATIVE
KETONES UR: 5 mg/dL — AB
LEUKOCYTES UA: NEGATIVE
NITRITE: NEGATIVE
PH: 5 (ref 5.0–8.0)
PROTEIN: NEGATIVE mg/dL
Specific Gravity, Urine: 1.027 (ref 1.005–1.030)

## 2018-11-11 LAB — URINE DRUG SCREEN, QUALITATIVE (ARMC ONLY)
AMPHETAMINES, UR SCREEN: NOT DETECTED
Barbiturates, Ur Screen: NOT DETECTED
Benzodiazepine, Ur Scrn: NOT DETECTED
COCAINE METABOLITE, UR ~~LOC~~: NOT DETECTED
Cannabinoid 50 Ng, Ur ~~LOC~~: NOT DETECTED
MDMA (ECSTASY) UR SCREEN: NOT DETECTED
Methadone Scn, Ur: NOT DETECTED
Opiate, Ur Screen: NOT DETECTED
Phencyclidine (PCP) Ur S: NOT DETECTED
TRICYCLIC, UR SCREEN: NOT DETECTED

## 2018-11-11 LAB — COMPREHENSIVE METABOLIC PANEL
ALBUMIN: 3.9 g/dL (ref 3.5–5.0)
ALT: 73 U/L — ABNORMAL HIGH (ref 0–44)
ANION GAP: 11 (ref 5–15)
AST: 61 U/L — ABNORMAL HIGH (ref 15–41)
Alkaline Phosphatase: 73 U/L (ref 38–126)
BILIRUBIN TOTAL: 0.8 mg/dL (ref 0.3–1.2)
BUN: 14 mg/dL (ref 6–20)
CALCIUM: 9.4 mg/dL (ref 8.9–10.3)
CO2: 25 mmol/L (ref 22–32)
CREATININE: 0.65 mg/dL (ref 0.44–1.00)
Chloride: 97 mmol/L — ABNORMAL LOW (ref 98–111)
GFR calc Af Amer: >60 mL/min (ref >60)
GFR calc non Af Amer: >60 mL/min (ref >60)
GLUCOSE: 362 mg/dL — AB (ref 70–99)
POTASSIUM: 4.3 mmol/L (ref 3.5–5.1)
SODIUM: 133 mmol/L — AB (ref 135–145)
Total Protein: 8.1 g/dL (ref 6.5–8.1)

## 2018-11-11 LAB — TROPONIN I: Troponin I: <0.03 ng/mL (ref <0.03)

## 2018-11-11 LAB — POCT PREGNANCY, URINE: Preg Test, Ur: NEGATIVE

## 2018-11-11 MED ORDER — PROCHLORPERAZINE EDISYLATE 10 MG/2ML IJ SOLN
10.0000 mg | Freq: Once | INTRAMUSCULAR | Status: AC
  Administered 2018-11-11: 10 mg via INTRAVENOUS
  Filled 2018-11-11: qty 2

## 2018-11-11 MED ORDER — SODIUM CHLORIDE 0.9 % IV BOLUS
500.0000 mL | Freq: Once | INTRAVENOUS | Status: AC
  Administered 2018-11-11: 500 mL via INTRAVENOUS

## 2018-11-11 MED ORDER — DIPHENHYDRAMINE HCL 50 MG/ML IJ SOLN
25.0000 mg | Freq: Once | INTRAMUSCULAR | Status: AC
  Administered 2018-11-11: 25 mg via INTRAVENOUS
  Filled 2018-11-11: qty 1

## 2018-11-11 NOTE — ED Notes (Signed)
Pt c/o having a HA with blurred vision since 3 am with a sturdier noted at time, but at other times people is able to complete sentences without delay. Pt is noted watching during the IV insertion and pulling away.

## 2018-11-11 NOTE — ED Notes (Signed)
Patient transported to CT 

## 2018-11-11 NOTE — ED Triage Notes (Signed)
Patient presents to the ED via EMS from home.  Family reports to EMS that patient has seemed weak, tired and confused this morning with stuttering speech that is not normal.  Patient is diabetic and has hypertension but is non-compliant with medications on both.  Patient reported a headache with blurred vision that began at 3am.

## 2018-11-11 NOTE — Discharge Instructions (Addendum)
You are evaluated for vision changes including loss of vision which is now better, as well as stuttering speech, which is now better, as well as headache which is now better.  As we discussed, although no certain cause was found, your exam and evaluation are overall reassuring in the emerge department today.  It was recommended that you do a lumbar puncture, but you declined.  You need very close follow-up with your primary care doctor, as well as an eye doctor Ophthalmologist as above this week, and neurologist, Dr. Malvin JohnsPotter above.  Return to the emergency department immediately for any worsening condition including loss of vision, vision changes, blurry vision, headache, weakness, numbness, confusion or altered mental status, chest pain, dizziness or passing out, or any other symptoms concerning to you.

## 2018-11-11 NOTE — ED Notes (Signed)
Urine collected preg test NEG, xray called patient ready for pick up. IV to left AC infiltrated. IV has beed D/C'd. Patient refused second IV placement.

## 2018-11-11 NOTE — ED Provider Notes (Signed)
[facility] Emergency Department Provider Note ____________________________________________   I have reviewed the triage vital signs and the triage nursing note.  HISTORY  Chief Complaint Altered Mental Status   Historian Level 5 Caveat History Limited by poor historian  HPI Cassandra Flynn is a 22 y.o. female with history of obesity, hypertension, diabetes, asthma presents to the ED  by EMS from home, reportedly having woken up this morning stuttering her speech, sounds like may be mild headache, and blurry vision.  Patient got back to her ED room she states that both eyes went dark and that she cannot see out of both eyes and is just dark.  Denies headache now.  Denies focal weakness.  No chest pain or cough or trouble breathing.  No recent fevers.  Denies nausea or vomiting or diarrhea.  States that she has never had an episode like this before.    Past Medical History:  Diagnosis Date  . Asthma   . Diabetes mellitus without complication (HCC)   . Hypertension   . Sleep apnea     Patient Active Problem List   Diagnosis Date Noted  . Abdominal pain affecting pregnancy, antepartum 03/24/2017  . Type 2 diabetes mellitus affecting pregnancy in third trimester, antepartum 03/24/2017  . Morbid obesity (HCC) 03/24/2017  . Labor and delivery, indication for care 02/22/2016  . Post traumatic stress disorder (PTSD) 06/16/2014  . Bipolar I disorder, most recent episode (or current) mixed, severe, specified as with psychotic behavior 06/16/2014  . ODD (oppositional defiant disorder) 06/16/2014    Past Surgical History:  Procedure Laterality Date  . CESAREAN SECTION    . CHOLECYSTECTOMY    . SKIN GRAFT    . TONSILLECTOMY      Prior to Admission medications   Medication Sig Start Date End Date Taking? Authorizing Provider  albuterol (PROVENTIL HFA;VENTOLIN HFA) 108 (90 Base) MCG/ACT inhaler Inhale 2 puffs into the lungs every 6 (six) hours as  needed for wheezing or shortness of breath. 08/11/17  Yes Willy Eddy, MD  metFORMIN (GLUCOPHAGE) 500 MG tablet Take 2 tablets (1,000 mg total) by mouth 2 (two) times daily with a meal. 03/24/17  Yes Linzie Collin, MD  dicyclomine (BENTYL) 10 MG capsule Take 1 capsule (10 mg total) by mouth 3 (three) times daily as needed for up to 5 days for spasms. 03/08/18 03/13/18  Dionne Bucy, MD  HYDROcodone-acetaminophen (NORCO) 5-325 MG tablet Take 1 tablet by mouth every 6 (six) hours as needed for moderate pain. Patient not taking: Reported on 11/11/2018 08/11/17   Irean Hong, MD  ibuprofen (ADVIL,MOTRIN) 800 MG tablet Take 1 tablet (800 mg total) by mouth every 8 (eight) hours as needed for moderate pain. Patient not taking: Reported on 11/11/2018 08/11/17   Irean Hong, MD  norelgestromin-ethinyl estradiol (ORTHO EVRA) 150-35 MCG/24HR transdermal patch Place 1 patch onto the skin once a week. Reported on 02/14/2016    [provider]  OLANZapine (ZYPREXA) 5 MG tablet Take 1 tablet (5 mg total) by mouth at bedtime. Patient not taking: Reported on 11/11/2018 08/26/17   Willy Eddy, MD  ondansetron (ZOFRAN ODT) 8 MG disintegrating tablet Take 1 tablet (8 mg total) by mouth every 8 (eight) hours as needed for nausea or vomiting. Patient not taking: Reported on 02/14/2016 08/29/15   Sharman Cheek, MD  oxyCODONE-acetaminophen (PERCOCET) 5-325 MG tablet Take 1-2 tablets by mouth every 6 (six) hours as needed. Patient not taking: Reported on 11/11/2018 08/12/17   Mayford Knife,  Cecille Amsterdam, MD  ranitidine (ZANTAC) 150 MG capsule Take 1 capsule (150 mg total) by mouth 2 (two) times daily. Patient not taking: Reported on 02/14/2016 08/29/15   Sharman Cheek, MD  traZODone (DESYREL) 25 mg TABS tablet Take 0.5 tablets (25 mg total) by mouth at bedtime as needed for sleep. Patient not taking: Reported on 02/14/2016 06/23/14   Kendrick Fries, NP    Allergies  Allergen Reactions  . Penicillins Itching  and Swelling    Has patient had a PCN reaction causing immediate rash, facial/tongue/throat swelling, SOB or lightheadedness with hypotension: Unknown Has patient had a PCN reaction causing severe rash involving mucus membranes or skin necrosis: Unknown Has patient had a PCN reaction that required hospitalization: Unknown Has patient had a PCN reaction occurring within the last 10 years: Unknown If all of the above answers are "NO", then may proceed with Cephalosporin use.     No family history on file.  Social History Social History   Tobacco Use  . Smoking status: Never Smoker  . Smokeless tobacco: Never Used  Substance Use Topics  . Alcohol use: No  . Drug use: No    Review of Systems  Constitutional: Negative for fever. Eyes: Positive as per HPI for visual changes. ENT: Negative for sore throat. Cardiovascular: Negative for chest pain. Respiratory: Negative for shortness of breath. Gastrointestinal: Negative for abdominal pain, vomiting and diarrhea. Genitourinary: Negative for dysuria. Musculoskeletal: Negative for back pain. Skin: Negative for rash. Neurological: Sounds like there is a global headache this morning, currently denying headache.   ____________________________________________   PHYSICAL EXAM:  VITAL SIGNS: ED Triage Vitals  Enc Vitals Group     BP 11/11/18 0953 (!) 133/96     Pulse Rate 11/11/18 0953 (!) 103     Resp 11/11/18 0953 18     Temp 11/11/18 0953 (!) 97.5 F (36.4 C)     Temp Source 11/11/18 0953 Oral     SpO2 11/11/18 0953 100 %     Weight 11/11/18 1006 (!) 361 lb 8.9 oz (164 kg)     Height 11/11/18 1006 5\' 11"  (1.803 m)     Head Circumference --      Peak Flow --      Pain Score 11/11/18 0954 0     Pain Loc --      Pain Edu? --      Excl. in GC? --      Constitutional: Alert and oriented.  She is stuttering her speech.  She is crying. HEENT      Head: Normocephalic and atraumatic.      Eyes: Conjunctivae are normal. Pupils  equal and round.  Funduscopic exam was limited as patient was really unable to cooperate, and kept moving her eyes around.  No apparent hemorrhages, however I was unable to assess the optic disc bilaterally.      Ears:         Nose: No congestion/rhinnorhea.      Mouth/Throat: Mucous membranes are moist.      Neck: No stridor. Cardiovascular/Chest: Normal rate, regular rhythm.  No murmurs, rubs, or gallops. Respiratory: Normal respiratory effort without tachypnea nor retractions. Breath sounds are clear and equal bilaterally. No wheezes/rales/rhonchi. Gastrointestinal: Soft. No distention, no guarding, no rebound. Nontender.  Morbidly obese. Genitourinary/rectal:Deferred Musculoskeletal: Nontender with normal range of motion in all extremities. No joint effusions.  No lower extremity tenderness.  No edema. Neurologic: Facial droop.  She is stuttering her speech.  No abnormal language.  5 out of 5 strength in 4 extremities.  Normal sensation right upper, left upper, and left lower extremities.  Parasthesia right lower extremity. Skin:  Skin is warm, dry and intact. No rash noted. Psychiatric: Patient is very anxious and crying.   ____________________________________________  LABS (pertinent positives/negatives) I, Governor Rooksebecca Lashanti Chambless, MD the attending physician have reviewed the labs noted below.  Labs Reviewed  COMPREHENSIVE METABOLIC PANEL - Abnormal; Notable for the following components:      Result Value   Sodium 133 (*)    Chloride 97 (*)    Glucose, Bld 362 (*)    AST 61 (*)    ALT 73 (*)    All other components within normal limits  URINALYSIS, COMPLETE (UACMP) WITH MICROSCOPIC - Abnormal; Notable for the following components:   Color, Urine YELLOW (*)    APPearance HAZY (*)    Glucose, UA >=500 (*)    Ketones, ur 5 (*)    All other components within normal limits  TROPONIN I  CBC WITH DIFFERENTIAL/PLATELET  URINE DRUG SCREEN, QUALITATIVE ([facility] ONLY)  POCT PREGNANCY, URINE     ____________________________________________    EKG I, Governor Rooksebecca Vuk Skillern, MD, the attending physician have personally viewed and interpreted all ECGs.  94 bpm.  Normal sinus rhythm.  Narrow QS per normal axis.  Normal ST and T wave ____________________________________________  RADIOLOGY   CT without contrast: Negative noncontrast head CT.  MRI brain without contrast:  IMPRESSION: Normal noncontrast MRI appearance of the brain. __________________________________________  PROCEDURES  Procedure(s) performed: None  Procedures  Critical Care performed: CRITICAL CARE Performed by: Governor Rooksebecca Felcia Huebert   Total critical care time: 30 minutes  Critical care time was exclusive of separately billable procedures and treating other patients.  Critical care was necessary to treat or prevent imminent or life-threatening deterioration.  Critical care was time spent personally by me on the following activities: development of treatment plan with patient and/or surrogate as well as nursing, discussions with consultants, evaluation of patient's response to treatment, examination of patient, obtaining history from patient or surrogate, ordering and performing treatments and interventions, ordering and review of laboratory studies, ordering and review of radiographic studies, pulse oximetry and re-evaluation of patient's condition.    ____________________________________________  ED COURSE / ASSESSMENT AND PLAN  Pertinent labs & imaging results that were available during my care of the patient were reviewed by me and considered in my medical decision making (see chart for details).    Patient here with multiple symptoms apparently since upon waking this morning in terms of "altered mental status" being described as stuttering speech, not any slurred speech or dysarthria, she is a little slow to answer questions because she is stuttering, but she is not disoriented here.  When she got here she states  her blurred vision turned into loss of vision around 10 AM the ER room.  At this point she seems quite upset and anxious and tearful.  Last known completely normal would have been yesterday evening.  Patient is outside of any TPA treatment window, and I really do not think this is a TIA or acute stroke.  CT head obtained and is negative for any acute findings.  Migraine versus pseudotumor considered highest in the differential.  I discussed with patient obtaining CSF, patient was adamant that she did not want to be stuck with a needle as she was apparently severely uncomfortable getting a peripheral IV.  We discussed that this is really the best way to determine if there is  elevated intracranial pressure and discussed that we could consider fluoroscopy guided LP and she agreed to fluoroscopy guided LP.   Patient brought back to the room, patient had declined fluoroscopy guided LP.  I went back to talk to her, and she stated that her vision was better in both eyes and in fact was able to tell me how many fingers I was holding up at this point time which is certainly an improvement from what she had stated previously.  We still discussed that pseudotumor cerebri was certainly on the differential and I did recommend that she have the LP.  The patient stated she was not going to have that.  We discussed obtaining MRI, and she was okay with this.  I spoke with the reading MRI radiologist to discuss what type of MRI, and was recommended noncontrast MRI of the brain.  Again is with bilateral symptoms, not clinically concerning for MS./Optic neuritis.  I am recommending that she follow-up with an ophthalmologist for a full eye exam as I really was not able to get an adequate full funduscopic exam.  In any case her symptoms are now better.  MRI of the brain is normal.  Again she is not having a headache, I do not have a significant concern for dural sinus thrombosis.  Reexamination again, she now  has normal vision.  More numbness of the leg.  No more stuttering her speech.  We discussed its possible with the intermittent vision symptoms this still could be related to pseudotumor cerebri.  She needs additional close follow-up with her primary care doctor, as well as ophthalmology this week and I am asking her to make this appointment, as well as close follow-up with a neurologist.  Patient really was quite upset and tearful early on with her symptoms, which certainly may have been due to the symptoms, but also raising suspicion for possible panic attack.  I do not think symptoms are related to TIA.  We discussed at length that she would certainly need to return immediately if she had any new or return of neurologic deficits, or certainly any vision changes.  She understands and is in agreement to follow-up closely with ophthalmology, neurology and primary care.        CONSULTATIONS:   Interventional radiologist, to plan for fluoroscopy guided LP.     Patient / Family / Caregiver informed of clinical course, medical decision-making process, and agree with plan.   I discussed return precautions, follow-up instructions, and discharge instructions with patient and/or family.  Discharge Instructions : You are evaluated for vision changes including loss of vision which is now better, as well as stuttering speech, which is now better, as well as headache which is now better.  As we discussed, although no certain cause was found, your exam and evaluation are overall reassuring in the emerge department today.  It was recommended that you do a lumbar puncture, but you declined.  You need very close follow-up with your primary care doctor, as well as an eye doctor Ophthalmologist as above this week, and neurologist, Dr. Malvin Johns above.  Return to the emergency department immediately for any worsening condition including loss of vision, vision changes, blurry vision, headache, weakness,  numbness, confusion or altered mental status, chest pain, dizziness or passing out, or any other symptoms concerning to you.    ___________________________________________   FINAL CLINICAL IMPRESSION(S) / ED DIAGNOSES   Final diagnoses:  Vision loss, bilateral  Stuttering  Headache, unspecified headache type  All resolved.  ___________________________________________         Note: This dictation was prepared with Dragon dictation. Any transcriptional errors that result from this process are unintentional    Governor Rooks, MD 11/11/18 [phone]

## 2019-03-30 ENCOUNTER — Emergency Department
Admission: EM | Admit: 2019-03-30 | Discharge: 2019-03-30 | Disposition: A | Discharge status: Home / Self Care | Payer: Medicaid Other | Attending: Emergency Medicine | Admitting: Emergency Medicine

## 2019-03-30 ENCOUNTER — Other Ambulatory Visit: Payer: Self-pay

## 2019-03-30 ENCOUNTER — Encounter: Payer: Self-pay | Admitting: Emergency Medicine

## 2019-03-30 DIAGNOSIS — J45909 Unspecified asthma, uncomplicated: Secondary | ICD-10-CM | POA: Insufficient documentation

## 2019-03-30 DIAGNOSIS — K3184 Gastroparesis: Secondary | ICD-10-CM | POA: Diagnosis not present

## 2019-03-30 DIAGNOSIS — E1143 Type 2 diabetes mellitus with diabetic autonomic (poly)neuropathy: Secondary | ICD-10-CM | POA: Insufficient documentation

## 2019-03-30 DIAGNOSIS — I10 Essential (primary) hypertension: Secondary | ICD-10-CM | POA: Diagnosis not present

## 2019-03-30 DIAGNOSIS — R109 Unspecified abdominal pain: Secondary | ICD-10-CM | POA: Diagnosis present

## 2019-03-30 LAB — CBC WITH DIFFERENTIAL/PLATELET
Abs Immature Granulocytes: 0.01 10*3/uL (ref 0.00–0.07)
Basophils Absolute: 0 10*3/uL (ref 0.0–0.1)
Basophils Relative: 0 %
Eosinophils Absolute: 0.1 10*3/uL (ref 0.0–0.5)
Eosinophils Relative: 1 %
HCT: 41.4 % (ref 36.0–46.0)
Hemoglobin: 14.2 g/dL (ref 12.0–15.0)
Immature Granulocytes: 0 %
Lymphocytes Relative: 44 %
Lymphs Abs: 4.1 10*3/uL — ABNORMAL HIGH (ref 0.7–4.0)
MCH: 29.5 pg (ref 26.0–34.0)
MCHC: 34.3 g/dL (ref 30.0–36.0)
MCV: 86.1 fL (ref 80.0–100.0)
Monocytes Absolute: 0.5 10*3/uL (ref 0.1–1.0)
Monocytes Relative: 5 %
Neutro Abs: 4.6 10*3/uL (ref 1.7–7.7)
Neutrophils Relative %: 50 %
Platelets: 238 10*3/uL (ref 150–400)
RBC: 4.81 MIL/uL (ref 3.87–5.11)
RDW: 12 % (ref 11.5–15.5)
Smear Review: NORMAL
WBC: 9.3 10*3/uL (ref 4.0–10.5)
nRBC: 0 % (ref 0.0–0.2)

## 2019-03-30 LAB — URINALYSIS, COMPLETE (UACMP) WITH MICROSCOPIC
Bacteria, UA: NONE SEEN
Bilirubin Urine: NEGATIVE
Glucose, UA: >=500 mg/dL — AB
Hgb urine dipstick: NEGATIVE
Ketones, ur: NEGATIVE mg/dL
Nitrite: NEGATIVE
Protein, ur: NEGATIVE mg/dL
Specific Gravity, Urine: 1.027 (ref 1.005–1.030)
pH: 5 (ref 5.0–8.0)

## 2019-03-30 LAB — COMPREHENSIVE METABOLIC PANEL
ALT: 87 U/L — ABNORMAL HIGH (ref 0–44)
AST: 111 U/L — ABNORMAL HIGH (ref 15–41)
Albumin: 4.2 g/dL (ref 3.5–5.0)
Alkaline Phosphatase: 79 U/L (ref 38–126)
Anion gap: 13 (ref 5–15)
BUN: 13 mg/dL (ref 6–20)
CO2: 22 mmol/L (ref 22–32)
Calcium: 9.3 mg/dL (ref 8.9–10.3)
Chloride: 99 mmol/L (ref 98–111)
Creatinine, Ser: 0.61 mg/dL (ref 0.44–1.00)
GFR calc Af Amer: >60 mL/min (ref >60)
GFR calc non Af Amer: >60 mL/min (ref >60)
Glucose, Bld: 348 mg/dL — ABNORMAL HIGH (ref 70–99)
Potassium: 4 mmol/L (ref 3.5–5.1)
Sodium: 134 mmol/L — ABNORMAL LOW (ref 135–145)
Total Bilirubin: 0.4 mg/dL (ref 0.3–1.2)
Total Protein: 8 g/dL (ref 6.5–8.1)

## 2019-03-30 LAB — HCG, QUANTITATIVE, PREGNANCY: hCG, Beta Chain, Quant, S: <1 m[IU]/mL (ref <5)

## 2019-03-30 LAB — SAMPLE TO BLOOD BANK

## 2019-03-30 LAB — LIPASE, BLOOD: Lipase: 30 U/L (ref 11–51)

## 2019-03-30 MED ORDER — METOCLOPRAMIDE HCL 5 MG/ML IJ SOLN
10.0000 mg | Freq: Once | INTRAMUSCULAR | Status: AC
  Administered 2019-03-30: 10 mg via INTRAVENOUS
  Filled 2019-03-30: qty 2

## 2019-03-30 MED ORDER — ACETAMINOPHEN 500 MG PO TABS
1000.0000 mg | ORAL_TABLET | Freq: Once | ORAL | Status: AC
  Administered 2019-03-30: 1000 mg via ORAL
  Filled 2019-03-30: qty 2

## 2019-03-30 MED ORDER — GLIPIZIDE 5 MG PO TABS: 5.0000 mg | ORAL_TABLET | Freq: Two times a day (BID) | ORAL | 11 refills | Status: DC

## 2019-03-30 MED ORDER — ONDANSETRON HCL 4 MG/2ML IJ SOLN
4.0000 mg | Freq: Once | INTRAMUSCULAR | Status: AC
  Administered 2019-03-30: 4 mg via INTRAVENOUS
  Filled 2019-03-30: qty 2

## 2019-03-30 MED ORDER — GLIPIZIDE 5 MG PO TABS: 5.0000 mg | ORAL_TABLET | Freq: Every day | ORAL | 11 refills | Status: DC

## 2019-03-30 NOTE — ED Provider Notes (Signed)
[facility] Emergency Department Provider Note       Time seen: ----------------------------------------- 4:27 PM on 03/30/2019 -----------------------------------------   I have reviewed the triage vital signs and the nursing notes.  HISTORY   Chief Complaint No chief complaint on file.   HPI Cassandra Flynn is a 23 y.o. female with a history of asthma, diabetes, hypertension, sleep apnea who presents to the ED for abdominal pain.  Patient arrives complaining of diffuse upper abdominal pain.  She states she is pregnant but she is not sure how far along she is.  She had a positive urine pregnancy test last week.  Pains are coming and going every 2 minutes.  She is G3, P2, has had persistent vomiting.  Past Medical History:  Diagnosis Date  . Asthma   . Diabetes mellitus without complication (HCC)   . Hypertension   . Sleep apnea     Patient Active Problem List   Diagnosis Date Noted  . Abdominal pain affecting pregnancy, antepartum 03/24/2017  . Type 2 diabetes mellitus affecting pregnancy in third trimester, antepartum 03/24/2017  . Morbid obesity (HCC) 03/24/2017  . Labor and delivery, indication for care 02/22/2016  . Post traumatic stress disorder (PTSD) 06/16/2014  . Bipolar I disorder, most recent episode (or current) mixed, severe, specified as with psychotic behavior 06/16/2014  . ODD (oppositional defiant disorder) 06/16/2014    Past Surgical History:  Procedure Laterality Date  . CESAREAN SECTION    . CHOLECYSTECTOMY    . SKIN GRAFT    . TONSILLECTOMY      Allergies Penicillins  Social History Social History   Tobacco Use  . Smoking status: Never Smoker  . Smokeless tobacco: Never Used  Substance Use Topics  . Alcohol use: No  . Drug use: No   Review of Systems Constitutional: Negative for fever. Cardiovascular: Negative for chest pain. Respiratory: Negative for shortness of breath. Gastrointestinal: Positive for  abdominal pain and vomiting Musculoskeletal: Negative for back pain. Skin: Negative for rash. Neurological: Negative for headaches, focal weakness or numbness.  All systems negative/normal/unremarkable except as stated in the HPI  ____________________________________________   PHYSICAL EXAM:  VITAL SIGNS: ED Triage Vitals  Enc Vitals Group     BP      Pulse      Resp      Temp      Temp src      SpO2      Weight      Height      Head Circumference      Peak Flow      Pain Score      Pain Loc      Pain Edu?      Excl. in GC?    Constitutional: Alert and oriented.  Mild distress from pain, morbidly obese Eyes: Conjunctivae are normal. Normal extraocular movements. ENT      Head: Normocephalic and atraumatic.      Nose: No congestion/rhinnorhea.      Mouth/Throat: Mucous membranes are moist.      Neck: No stridor. Cardiovascular: Normal rate, regular rhythm. No murmurs, rubs, or gallops. Respiratory: Normal respiratory effort without tachypnea nor retractions. Breath sounds are clear and equal bilaterally. No wheezes/rales/rhonchi. Gastrointestinal: Fuhs upper abdominal tenderness, no rebound or guarding.  Normal bowel sounds. Musculoskeletal: Nontender with normal range of motion in extremities. No lower extremity tenderness nor edema. Neurologic:  Normal speech and language. No gross focal neurologic deficits are appreciated.  Skin:  Skin is  warm, dry and intact. No rash noted. Psychiatric: Mood and affect are normal. Speech and behavior are normal.  ____________________________________________  ED COURSE:  As part of my medical decision making, I reviewed the following data within the electronic MEDICAL RECORD NUMBER History obtained from family if available, nursing notes, old chart and ekg, as well as notes from prior ED visits. Patient presented for abdominal pain and vomiting, we will assess with labs and imaging as indicated at this time.   Procedures  [place] Falls[place]ia  Burns Powell was evaluated in Emergency Department on 03/30/2019 for the symptoms described in the history of present illness. She was evaluated in the context of the global COVID-19 pandemic, which necessitated consideration that the patient might be at risk for infection with the SARS-CoV-2 virus that causes COVID-19. Institutional protocols and algorithms that pertain to the evaluation of patients at risk for COVID-19 are in a state of rapid change based on information released by regulatory bodies including the CDC and federal and state organizations. These policies and algorithms were followed during the patient's care in the ED.  ____________________________________________   LABS (pertinent positives/negatives)  Labs Reviewed  CBC WITH DIFFERENTIAL/PLATELET - Abnormal; Notable for the following components:      Result Value   Lymphs Abs 4.1 (*)    All other components within normal limits  COMPREHENSIVE METABOLIC PANEL - Abnormal; Notable for the following components:   Sodium 134 (*)    Glucose, Bld 348 (*)    AST 111 (*)    ALT 87 (*)    All other components within normal limits  URINALYSIS, COMPLETE (UACMP) WITH MICROSCOPIC - Abnormal; Notable for the following components:   Color, Urine YELLOW (*)    APPearance HAZY (*)    Glucose, UA >=500 (*)    Leukocytes,Ua TRACE (*)    All other components within normal limits  LIPASE, BLOOD  HCG, QUANTITATIVE, PREGNANCY  SAMPLE TO BLOOD BANK  ____________________________________________   DIFFERENTIAL DIAGNOSIS   Gastritis, gastroenteritis, hyperemesis, dehydration, electrolyte abnormality  FINAL ASSESSMENT AND PLAN  Diabetic gastroparesis   Plan: The patient had presented for abdominal pain and vomiting in early pregnancy. Patient's labs were unremarkable with exception of hyperglycemia, blood sugar was 348.  Bedside ultrasound did not reveal any acute process.  She likely has fatty liver and chronically elevated liver  transaminases.  She was given IV Reglan here but is in fact not pregnant.  She will be discharged home with Reglan, I will add glipizide due to her poorly controlled diabetes.  She is cleared for close outpatient follow-up.   Ulice DashJohnathan E Lenell Mcconnell, MD    Note: This note was generated in part or whole with voice recognition software. Voice recognition is usually quite accurate but there are transcription errors that can and very often do occur. I apologize for any typographical errors that were not detected and corrected.     Emily FilbertWilliams, Caoimhe Damron E, MD 03/30/19 [phone]

## 2019-03-30 NOTE — ED Triage Notes (Signed)
Pt arrives and appears to be having contractions. Pt admits she is pregnant but unsure how far along.  Just had positive UPT last week.  Pains are coming about every 2 minutes. Pains started 2 hours ago. G3P2. Has had vomiting.

## 2019-03-30 NOTE — ED Notes (Signed)
Dr Mayford Knife at bedside with [place]

## 2019-05-05 ENCOUNTER — Emergency Department
Admission: EM | Admit: 2019-05-05 | Discharge: 2019-05-06 | Disposition: A | Discharge status: Home / Self Care | Payer: Medicaid Other | Attending: Emergency Medicine | Admitting: Emergency Medicine

## 2019-05-05 ENCOUNTER — Other Ambulatory Visit: Payer: Self-pay

## 2019-05-05 ENCOUNTER — Encounter: Payer: Self-pay | Admitting: Emergency Medicine

## 2019-05-05 DIAGNOSIS — O10011 Pre-existing essential hypertension complicating pregnancy, first trimester: Secondary | ICD-10-CM | POA: Diagnosis not present

## 2019-05-05 DIAGNOSIS — E119 Type 2 diabetes mellitus without complications: Secondary | ICD-10-CM | POA: Insufficient documentation

## 2019-05-05 DIAGNOSIS — Z3A01 Less than 8 weeks gestation of pregnancy: Secondary | ICD-10-CM | POA: Diagnosis not present

## 2019-05-05 DIAGNOSIS — O24111 Pre-existing diabetes mellitus, type 2, in pregnancy, first trimester: Secondary | ICD-10-CM | POA: Insufficient documentation

## 2019-05-05 DIAGNOSIS — J45909 Unspecified asthma, uncomplicated: Secondary | ICD-10-CM | POA: Insufficient documentation

## 2019-05-05 DIAGNOSIS — R109 Unspecified abdominal pain: Secondary | ICD-10-CM

## 2019-05-05 DIAGNOSIS — O26891 Other specified pregnancy related conditions, first trimester: Secondary | ICD-10-CM | POA: Insufficient documentation

## 2019-05-05 DIAGNOSIS — R1010 Upper abdominal pain, unspecified: Secondary | ICD-10-CM | POA: Diagnosis not present

## 2019-05-05 DIAGNOSIS — Z880 Allergy status to penicillin: Secondary | ICD-10-CM | POA: Insufficient documentation

## 2019-05-05 LAB — COMPREHENSIVE METABOLIC PANEL
ALT: 63 U/L — ABNORMAL HIGH (ref 0–44)
AST: 70 U/L — ABNORMAL HIGH (ref 15–41)
Albumin: 3.9 g/dL (ref 3.5–5.0)
Alkaline Phosphatase: 80 U/L (ref 38–126)
Anion gap: 13 (ref 5–15)
BUN: 11 mg/dL (ref 6–20)
CO2: 20 mmol/L — ABNORMAL LOW (ref 22–32)
Calcium: 8.9 mg/dL (ref 8.9–10.3)
Chloride: 103 mmol/L (ref 98–111)
Creatinine, Ser: 0.67 mg/dL (ref 0.44–1.00)
GFR calc Af Amer: >60 mL/min (ref >60)
GFR calc non Af Amer: >60 mL/min (ref >60)
Glucose, Bld: 417 mg/dL — ABNORMAL HIGH (ref 70–99)
Potassium: 3.7 mmol/L (ref 3.5–5.1)
Sodium: 136 mmol/L (ref 135–145)
Total Bilirubin: 0.3 mg/dL (ref 0.3–1.2)
Total Protein: 7.6 g/dL (ref 6.5–8.1)

## 2019-05-05 LAB — URINALYSIS, COMPLETE (UACMP) WITH MICROSCOPIC
Bacteria, UA: NONE SEEN
Bilirubin Urine: NEGATIVE
Glucose, UA: >=500 mg/dL — AB
Hgb urine dipstick: NEGATIVE
Ketones, ur: NEGATIVE mg/dL
Leukocytes,Ua: NEGATIVE
Nitrite: NEGATIVE
Protein, ur: NEGATIVE mg/dL
Specific Gravity, Urine: 1.031 — ABNORMAL HIGH (ref 1.005–1.030)
pH: 6 (ref 5.0–8.0)

## 2019-05-05 LAB — CBC
HCT: 40.9 % (ref 36.0–46.0)
Hemoglobin: 14 g/dL (ref 12.0–15.0)
MCH: 29.5 pg (ref 26.0–34.0)
MCHC: 34.2 g/dL (ref 30.0–36.0)
MCV: 86.1 fL (ref 80.0–100.0)
Platelets: 214 10*3/uL (ref 150–400)
RBC: 4.75 MIL/uL (ref 3.87–5.11)
RDW: 11.9 % (ref 11.5–15.5)
WBC: 6.8 10*3/uL (ref 4.0–10.5)
nRBC: 0 % (ref 0.0–0.2)

## 2019-05-05 LAB — HCG, QUANTITATIVE, PREGNANCY: hCG, Beta Chain, Quant, S: <1 m[IU]/mL (ref <5)

## 2019-05-05 LAB — LIPASE, BLOOD: Lipase: 30 U/L (ref 11–51)

## 2019-05-05 MED ORDER — INSULIN ASPART 100 UNIT/ML ~~LOC~~ SOLN
8.0000 [IU] | Freq: Once | SUBCUTANEOUS | Status: AC
  Administered 2019-05-05: 23:00:00 8 [IU] via INTRAVENOUS
  Filled 2019-05-05: qty 1

## 2019-05-05 MED ORDER — LIDOCAINE VISCOUS HCL 2 % MT SOLN
15.0000 mL | Freq: Once | OROMUCOSAL | Status: AC
  Administered 2019-05-05: 15 mL via ORAL
  Filled 2019-05-05: qty 15

## 2019-05-05 MED ORDER — SODIUM CHLORIDE 0.9 % IV BOLUS
1000.0000 mL | Freq: Once | INTRAVENOUS | Status: AC
  Administered 2019-05-05: 1000 mL via INTRAVENOUS

## 2019-05-05 MED ORDER — ALUM & MAG HYDROXIDE-SIMETH 200-200-20 MG/5ML PO SUSP: 30.0000 mL | ORAL | 0 refills | Status: DC | PRN

## 2019-05-05 MED ORDER — ALUM & MAG HYDROXIDE-SIMETH 200-200-20 MG/5ML PO SUSP
30.0000 mL | Freq: Once | ORAL | Status: AC
  Administered 2019-05-05: 30 mL via ORAL
  Filled 2019-05-05: qty 30

## 2019-05-05 NOTE — ED Triage Notes (Addendum)
Pt presents from home via [facility] with c/o severe central abdominal pain. Pt is [redacted] weeks pregnant. Pt states pain is intermintent. Pain 10/10. Pt blood sugar 402 for ems. Allergic to penecillin. Pt states she has not had her metformin today. Pt is G3P2. Pt states she had no problems with previous pregnancy.

## 2019-05-05 NOTE — ED Notes (Signed)
Carollee Herter, RN attempting for ultrasound IV.

## 2019-05-05 NOTE — Discharge Instructions (Addendum)
pPlease use Maalox for symptom relief as well as just before bed to help avoid symptoms.  Return to the emergency department for any worsening pain, or any other symptom personally concerning to yourself.  I wish you could stay longer this get the ultrasound but since she cannot we will let you go.

## 2019-05-05 NOTE — ED Provider Notes (Signed)
Patient cannot wait any longer.  She has to go because  the people watching her children have to go to the hospital.  Her hCG is negative it was negative last month negative in December.  I will let her go.  We will treat her for the possible gastritis as planned.   Arnaldo Natal, MD 05/05/19 [phone]

## 2019-05-05 NOTE — ED Notes (Signed)
Attempted for 22g IV at R ac.  

## 2019-05-05 NOTE — ED Notes (Signed)
Attempted for 22g IV at L ac x2.

## 2019-05-05 NOTE — ED Provider Notes (Addendum)
[facility] Emergency Department Provider Note  Time seen: 9:33 PM  I have reviewed the triage vital signs and the nursing notes.   HISTORY  Chief Complaint Abdominal Pain   HPI Cassandra Flynn is a 23 y.o. female with a past medical history of asthma, diabetes, hypertension, G3P2 at approximately [redacted] weeks pregnant who presents to the emergency department for upper abdominal pain.  According to the patient since this morning she has been experiencing pain across the upper abdomen, moderate in severity.  Denies any vomiting or diarrhea.  Denies any cough congestion or fever.  Denies any lower abdominal pain vaginal bleeding or discharge.  Denies any right upper quadrant pain.  Patient status post cholecystectomy.  Describes her pain as moderate located across the upper abdomen aching type pain.  Denies any alcohol use.  Past Medical History:  Diagnosis Date  . Asthma   . Diabetes mellitus without complication (HCC)   . Hypertension   . Sleep apnea     Patient Active Problem List   Diagnosis Date Noted  . Abdominal pain affecting pregnancy, antepartum 03/24/2017  . Type 2 diabetes mellitus affecting pregnancy in third trimester, antepartum 03/24/2017  . Morbid obesity (HCC) 03/24/2017  . Labor and delivery, indication for care 02/22/2016  . Post traumatic stress disorder (PTSD) 06/16/2014  . Bipolar I disorder, most recent episode (or current) mixed, severe, specified as with psychotic behavior 06/16/2014  . ODD (oppositional defiant disorder) 06/16/2014    Past Surgical History:  Procedure Laterality Date  . CESAREAN SECTION    . CHOLECYSTECTOMY    . SKIN GRAFT    . TONSILLECTOMY      Prior to Admission medications   Medication Sig Start Date End Date Taking? Authorizing Provider  albuterol (PROVENTIL HFA;VENTOLIN HFA) 108 (90 Base) MCG/ACT inhaler Inhale 2 puffs into the lungs every 6 (six) hours as needed for wheezing or shortness of breath.  08/11/17   Willy Eddy, MD  dicyclomine (BENTYL) 10 MG capsule Take 1 capsule (10 mg total) by mouth 3 (three) times daily as needed for up to 5 days for spasms. 03/08/18 03/13/18  Dionne Bucy, MD  glipiZIDE (GLUCOTROL) 5 MG tablet Take 1 tablet (5 mg total) by mouth 2 (two) times daily. 03/30/19 03/29/20  Emily Filbert, MD  glipiZIDE (GLUCOTROL) 5 MG tablet Take 1 tablet (5 mg total) by mouth daily. 03/30/19 03/29/20  Emily Filbert, MD  HYDROcodone-acetaminophen (NORCO) 5-325 MG tablet Take 1 tablet by mouth every 6 (six) hours as needed for moderate pain. Patient not taking: Reported on 11/11/2018 08/11/17   Irean Hong, MD  ibuprofen (ADVIL,MOTRIN) 800 MG tablet Take 1 tablet (800 mg total) by mouth every 8 (eight) hours as needed for moderate pain. Patient not taking: Reported on 11/11/2018 08/11/17   Irean Hong, MD  metFORMIN (GLUCOPHAGE) 500 MG tablet Take 2 tablets (1,000 mg total) by mouth 2 (two) times daily with a meal. 03/24/17   Linzie Collin, MD  norelgestromin-ethinyl estradiol (ORTHO EVRA) 150-35 MCG/24HR transdermal patch Place 1 patch onto the skin once a week. Reported on 02/14/2016    [provider]  OLANZapine (ZYPREXA) 5 MG tablet Take 1 tablet (5 mg total) by mouth at bedtime. Patient not taking: Reported on 11/11/2018 08/26/17   Willy Eddy, MD  ondansetron (ZOFRAN ODT) 8 MG disintegrating tablet Take 1 tablet (8 mg total) by mouth every 8 (eight) hours as needed for nausea or vomiting. Patient not taking: Reported on  02/14/2016 08/29/15   Sharman Cheek, MD  oxyCODONE-acetaminophen (PERCOCET) 5-325 MG tablet Take 1-2 tablets by mouth every 6 (six) hours as needed. Patient not taking: Reported on 11/11/2018 08/12/17   Emily Filbert, MD  ranitidine (ZANTAC) 150 MG capsule Take 1 capsule (150 mg total) by mouth 2 (two) times daily. Patient not taking: Reported on 02/14/2016 08/29/15   Sharman Cheek, MD  traZODone (DESYREL) 25 mg TABS tablet  Take 0.5 tablets (25 mg total) by mouth at bedtime as needed for sleep. Patient not taking: Reported on 02/14/2016 06/23/14   Kendrick Fries, NP    Allergies  Allergen Reactions  . Penicillins Itching and Swelling    Has patient had a PCN reaction causing immediate rash, facial/tongue/throat swelling, SOB or lightheadedness with hypotension: Unknown Has patient had a PCN reaction causing severe rash involving mucus membranes or skin necrosis: Unknown Has patient had a PCN reaction that required hospitalization: Unknown Has patient had a PCN reaction occurring within the last 10 years: Unknown If all of the above answers are "NO", then may proceed with Cephalosporin use.     History reviewed. No pertinent family history.  Social History Social History   Tobacco Use  . Smoking status: Never Smoker  . Smokeless tobacco: Never Used  Substance Use Topics  . Alcohol use: No  . Drug use: No    Review of Systems Constitutional: Negative for fever ENT: Negative for recent illness/congestion Cardiovascular: Negative for chest pain. Respiratory: Negative for shortness of breath. Gastrointestinal: Upper abdominal pain.  Negative for vomiting or diarrhea.   Genitourinary: Negative for urinary compaints.  Negative for vaginal bleeding or discharge. Musculoskeletal: Negative for musculoskeletal complaints Neurological: Negative for headache All other ROS negative  ____________________________________________   PHYSICAL EXAM:  VITAL SIGNS: ED Triage Vitals  Enc Vitals Group     BP 05/05/19 2110 124/83     Pulse Rate 05/05/19 2110 (!) 122     Resp 05/05/19 2110 (!) 27     Temp 05/05/19 2108 99.5 F (37.5 C)     Temp Source 05/05/19 2108 Oral     SpO2 05/05/19 2110 97 %     Weight 05/05/19 2127 (!) 376 lb (170.6 kg)     Height 05/05/19 2127  (1.702 m)     Head Circumference --      Peak Flow --      Pain Score 05/05/19 2126 10     Pain Loc --      Pain Edu? --       Excl. in GC? --    Constitutional: Alert and oriented. Well appearing and in no distress. Eyes: Normal exam ENT      Head: Normocephalic and atraumatic.      Mouth/Throat: Mucous membranes are moist. Cardiovascular: Normal rate, regular rhythm.  Respiratory: Normal respiratory effort without tachypnea nor retractions. Breath sounds are clear  Gastrointestinal: Soft, obese, largely nontender with very slight epigastric tenderness on exam. Musculoskeletal: Nontender with normal range of motion in all extremities. Neurologic:  Normal speech and language. No gross focal neurologic deficits  Skin:  Skin is warm, dry and intact.  Psychiatric: Mood and affect are normal.      INITIAL IMPRESSION / ASSESSMENT AND PLAN / ED COURSE  Pertinent labs & imaging results that were available during my care of the patient were reviewed by me and considered in my medical decision making (see chart for details).   Patient presents emergency department for upper abdominal pain.  Describes  as moderate aching type pain.  Differential would include gastritis, gastroenteritis, peptic ulcer disease, pancreatitis.  We will check lab, dose a GI cocktail and continue to closely monitor.  I will perform a bedside ultrasound to further evaluate as well.  Patient agreeable to plan of care.  Patient's labs largely within normal limits.  Patient states complete pain relief after GI cocktail.  Highly suspect reflux related symptoms.  Patient states she has been experiencing severe gastric reflux during this pregnancy.  Bedside ultrasound I am unable to visualize a pregnancy however this could largely be due to intestinal air interference and body habitus.  We will proceed with an official OB ultrasound to further evaluate.  Patient care signed out to oncoming physician.  If ultrasound normal plan to discharge with Maalox.   Cassandra Flynn was evaluated in Emergency Department on 05/05/2019 for the symptoms described in  the history of present illness. She was evaluated in the context of the global COVID-19 pandemic, which necessitated consideration that the patient might be at risk for infection with the SARS-CoV-2 virus that causes COVID-19. Institutional protocols and algorithms that pertain to the evaluation of patients at risk for COVID-19 are in a state of rapid change based on information released by regulatory bodies including the CDC and federal and state organizations. These policies and algorithms were followed during the patient's care in the ED.   EKG viewed and interpreted by myself shows sinus tachycardia 113 bpm with a narrow QRS, normal axis, normal intervals, no concerning ST changes.  ____________________________________________   FINAL CLINICAL IMPRESSION(S) / ED DIAGNOSES  Upper abdominal pain Reflux   Minna AntisPaduchowski, Neidy Guerrieri, MD 05/05/19 2252    Minna AntisPaduchowski, Sahasra Belue, MD 05/05/19 2310

## 2019-05-06 NOTE — ED Notes (Signed)
Pt states she has to leave for family emergency. Dr. Darnelle Catalan at bedside. Pt states she understands the risk of leaving at this time. Dr. Darnelle Catalan counseled pt that she is welcome to come back at anytime.

## 2019-05-26 ENCOUNTER — Emergency Department
Admission: EM | Admit: 2019-05-26 | Discharge: 2019-05-26 | Disposition: A | Discharge status: Home / Self Care | Payer: Medicaid Other | Attending: Emergency Medicine | Admitting: Emergency Medicine

## 2019-05-26 ENCOUNTER — Other Ambulatory Visit: Payer: Self-pay

## 2019-05-26 ENCOUNTER — Encounter: Payer: Self-pay | Admitting: Emergency Medicine

## 2019-05-26 DIAGNOSIS — Z5321 Procedure and treatment not carried out due to patient leaving prior to being seen by health care provider: Secondary | ICD-10-CM | POA: Diagnosis not present

## 2019-05-26 DIAGNOSIS — O26891 Other specified pregnancy related conditions, first trimester: Secondary | ICD-10-CM | POA: Insufficient documentation

## 2019-05-26 DIAGNOSIS — O209 Hemorrhage in early pregnancy, unspecified: Secondary | ICD-10-CM | POA: Insufficient documentation

## 2019-05-26 DIAGNOSIS — R109 Unspecified abdominal pain: Secondary | ICD-10-CM | POA: Insufficient documentation

## 2019-05-26 DIAGNOSIS — Z3A20 20 weeks gestation of pregnancy: Secondary | ICD-10-CM | POA: Diagnosis not present

## 2019-05-26 LAB — URINALYSIS, COMPLETE (UACMP) WITH MICROSCOPIC
Bacteria, UA: NONE SEEN
Bilirubin Urine: NEGATIVE
Glucose, UA: >=500 mg/dL — AB
Hgb urine dipstick: NEGATIVE
Ketones, ur: 20 mg/dL — AB
Leukocytes,Ua: NEGATIVE
Nitrite: NEGATIVE
Protein, ur: NEGATIVE mg/dL
Specific Gravity, Urine: 1.028 (ref 1.005–1.030)
pH: 6 (ref 5.0–8.0)

## 2019-05-26 LAB — POCT PREGNANCY, URINE: Preg Test, Ur: NEGATIVE

## 2019-05-26 MED ORDER — SODIUM CHLORIDE 0.9% FLUSH: 3.0000 mL | Freq: Once | INTRAVENOUS | Status: DC

## 2019-05-26 NOTE — ED Notes (Signed)
When getting ready to start IV patient states she has a family emergency and has to leave, gait steady.

## 2019-05-26 NOTE — ED Triage Notes (Signed)
Patient states she has had abdominal pain x 4 days. States has had some vaginal spotting during that time. States thinks she is 5 months pregnant.

## 2019-05-26 NOTE — ED Notes (Addendum)
Pt in lobby stating "I feel like I need to push. I am five months pregnant." This tech noticed on pt stickers a post it that stated "negative HCG 05/05/19." POC urine preg preformed and resulted in a negative result. Amy,RN notified. Pt was placed in triage rm for triage.

## 2019-05-28 ENCOUNTER — Telehealth: Payer: Self-pay | Admitting: Emergency Medicine

## 2019-05-28 NOTE — Telephone Encounter (Signed)
Called patient due to lwot to inquire about condition and follow up plans. She also had ketones and glucose in ua.  No answer and no voicemail.

## 2019-06-21 ENCOUNTER — Inpatient Hospital Stay
Admission: EM | Admit: 2019-06-21 | Discharge: 2019-06-22 | Disposition: A | Discharge status: Home / Self Care | Payer: Medicaid Other | Source: Home / Self Care | Admitting: Obstetrics & Gynecology

## 2019-06-21 DIAGNOSIS — E1165 Type 2 diabetes mellitus with hyperglycemia: Secondary | ICD-10-CM | POA: Diagnosis present

## 2019-06-21 DIAGNOSIS — Z7984 Long term (current) use of oral hypoglycemic drugs: Secondary | ICD-10-CM | POA: Diagnosis not present

## 2019-06-21 DIAGNOSIS — F449 Dissociative and conversion disorder, unspecified: Secondary | ICD-10-CM | POA: Diagnosis present

## 2019-06-21 DIAGNOSIS — Z79899 Other long term (current) drug therapy: Secondary | ICD-10-CM | POA: Diagnosis not present

## 2019-06-21 DIAGNOSIS — I10 Essential (primary) hypertension: Secondary | ICD-10-CM | POA: Diagnosis not present

## 2019-06-21 DIAGNOSIS — E1110 Type 2 diabetes mellitus with ketoacidosis without coma: Secondary | ICD-10-CM | POA: Diagnosis present

## 2019-06-21 DIAGNOSIS — F458 Other somatoform disorders: Secondary | ICD-10-CM | POA: Diagnosis not present

## 2019-06-21 DIAGNOSIS — J45909 Unspecified asthma, uncomplicated: Secondary | ICD-10-CM | POA: Diagnosis not present

## 2019-06-21 DIAGNOSIS — IMO0002 Reserved for concepts with insufficient information to code with codable children: Secondary | ICD-10-CM | POA: Diagnosis present

## 2019-06-21 DIAGNOSIS — R55 Syncope and collapse: Secondary | ICD-10-CM | POA: Diagnosis present

## 2019-06-21 LAB — GLUCOSE, CAPILLARY: Glucose-Capillary: 357 mg/dL — ABNORMAL HIGH (ref 70–99)

## 2019-06-21 NOTE — OB Triage Note (Signed)
Pt c/o abd pain starting today.  Pt reports decreased fetal movement.  No LOF or bleeding today, pt reports bleeding though out this pregnancy.  Pt uncooperative and difficult to get answers from.  Pt reports she is [redacted]w[redacted]d pregnant.  Pt is constantly on face time with family.  Pt yelling "is my baby ok?" over and over.  When RN returned to room after steping out for a brief moment to get CBG machine and call MD at 2335, pt was unresponsive with phone in the floor.  Sternal rub was preformed and pt sat up in bed gasping for air.  Pt was breathing normally upon entering room and O2 level remained above 99%.  CBG performed-357.   Pt repeatedly becomes unresponsive and sternal rub preformed to which pt sits up in bed gasping asking " Where am I?".  Pt O2 never dropped below 98%.  Pt also urinating in bed.  Nursing supervisor at bedside at [place].  Pt began to answer questions and become more responsive. Rapid response called at [place].  MD at bedside at [place].  Pt transported to ER at 0040 with RN, Rapid response RN, AC and security.
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
## 2019-06-22 ENCOUNTER — Other Ambulatory Visit: Payer: Self-pay

## 2019-06-22 ENCOUNTER — Encounter: Payer: Self-pay | Admitting: Emergency Medicine

## 2019-06-22 ENCOUNTER — Encounter: Payer: Self-pay | Admitting: Obstetrics & Gynecology

## 2019-06-22 ENCOUNTER — Emergency Department
Admission: EM | Admit: 2019-06-22 | Discharge: 2019-06-22 | Disposition: A | Discharge status: Home / Self Care | Payer: Medicaid Other | Attending: Emergency Medicine | Admitting: Emergency Medicine

## 2019-06-22 DIAGNOSIS — E1110 Type 2 diabetes mellitus with ketoacidosis without coma: Secondary | ICD-10-CM

## 2019-06-22 DIAGNOSIS — J45909 Unspecified asthma, uncomplicated: Secondary | ICD-10-CM | POA: Insufficient documentation

## 2019-06-22 DIAGNOSIS — F449 Dissociative and conversion disorder, unspecified: Secondary | ICD-10-CM | POA: Diagnosis present

## 2019-06-22 DIAGNOSIS — E1165 Type 2 diabetes mellitus with hyperglycemia: Secondary | ICD-10-CM | POA: Insufficient documentation

## 2019-06-22 DIAGNOSIS — Z79899 Other long term (current) drug therapy: Secondary | ICD-10-CM | POA: Insufficient documentation

## 2019-06-22 DIAGNOSIS — IMO0002 Reserved for concepts with insufficient information to code with codable children: Secondary | ICD-10-CM | POA: Diagnosis present

## 2019-06-22 DIAGNOSIS — I10 Essential (primary) hypertension: Secondary | ICD-10-CM | POA: Insufficient documentation

## 2019-06-22 DIAGNOSIS — F458 Other somatoform disorders: Secondary | ICD-10-CM | POA: Diagnosis present

## 2019-06-22 DIAGNOSIS — R739 Hyperglycemia, unspecified: Secondary | ICD-10-CM

## 2019-06-22 DIAGNOSIS — Z7984 Long term (current) use of oral hypoglycemic drugs: Secondary | ICD-10-CM | POA: Insufficient documentation

## 2019-06-22 HISTORY — DX: Type 2 diabetes mellitus with ketoacidosis without coma: E11.10

## 2019-06-22 LAB — CBC WITH DIFFERENTIAL/PLATELET
Abs Immature Granulocytes: 0.03 10*3/uL (ref 0.00–0.07)
Basophils Absolute: 0 10*3/uL (ref 0.0–0.1)
Basophils Relative: 0 %
Eosinophils Absolute: 0 10*3/uL (ref 0.0–0.5)
Eosinophils Relative: 0 %
HCT: 40.3 % (ref 36.0–46.0)
Hemoglobin: 13.7 g/dL (ref 12.0–15.0)
Immature Granulocytes: 0 %
Lymphocytes Relative: 32 %
Lymphs Abs: 2.7 10*3/uL (ref 0.7–4.0)
MCH: 29.5 pg (ref 26.0–34.0)
MCHC: 34 g/dL (ref 30.0–36.0)
MCV: 86.7 fL (ref 80.0–100.0)
Monocytes Absolute: 0.4 10*3/uL (ref 0.1–1.0)
Monocytes Relative: 5 %
Neutro Abs: 5.4 10*3/uL (ref 1.7–7.7)
Neutrophils Relative %: 63 %
Platelets: 261 10*3/uL (ref 150–400)
RBC: 4.65 MIL/uL (ref 3.87–5.11)
RDW: 11.9 % (ref 11.5–15.5)
WBC: 8.7 10*3/uL (ref 4.0–10.5)
nRBC: 0 % (ref 0.0–0.2)

## 2019-06-22 LAB — COMPREHENSIVE METABOLIC PANEL
ALT: 66 U/L — ABNORMAL HIGH (ref 0–44)
AST: 70 U/L — ABNORMAL HIGH (ref 15–41)
Albumin: 4 g/dL (ref 3.5–5.0)
Alkaline Phosphatase: 65 U/L (ref 38–126)
Anion gap: 13 (ref 5–15)
BUN: 11 mg/dL (ref 6–20)
CO2: 21 mmol/L — ABNORMAL LOW (ref 22–32)
Calcium: 8.9 mg/dL (ref 8.9–10.3)
Chloride: 102 mmol/L (ref 98–111)
Creatinine, Ser: 0.71 mg/dL (ref 0.44–1.00)
GFR calc Af Amer: >60 mL/min (ref >60)
GFR calc non Af Amer: >60 mL/min (ref >60)
Glucose, Bld: 345 mg/dL — ABNORMAL HIGH (ref 70–99)
Potassium: 3.9 mmol/L (ref 3.5–5.1)
Sodium: 136 mmol/L (ref 135–145)
Total Bilirubin: 0.4 mg/dL (ref 0.3–1.2)
Total Protein: 7.6 g/dL (ref 6.5–8.1)

## 2019-06-22 LAB — URINALYSIS, ROUTINE W REFLEX MICROSCOPIC
Bacteria, UA: NONE SEEN
Bilirubin Urine: NEGATIVE
Glucose, UA: >=500 mg/dL — AB
Hgb urine dipstick: NEGATIVE
Ketones, ur: 5 mg/dL — AB
Leukocytes,Ua: NEGATIVE
Nitrite: NEGATIVE
Protein, ur: NEGATIVE mg/dL
Specific Gravity, Urine: 1.035 — ABNORMAL HIGH (ref 1.005–1.030)
pH: 5 (ref 5.0–8.0)

## 2019-06-22 LAB — URINE DRUG SCREEN, QUALITATIVE (ARMC ONLY)
Amphetamines, Ur Screen: NOT DETECTED
Barbiturates, Ur Screen: NOT DETECTED
Benzodiazepine, Ur Scrn: NOT DETECTED
Cannabinoid 50 Ng, Ur ~~LOC~~: NOT DETECTED
Cocaine Metabolite,Ur ~~LOC~~: NOT DETECTED
MDMA (Ecstasy)Ur Screen: NOT DETECTED
Methadone Scn, Ur: NOT DETECTED
Opiate, Ur Screen: NOT DETECTED
Phencyclidine (PCP) Ur S: NOT DETECTED
Tricyclic, Ur Screen: NOT DETECTED

## 2019-06-22 LAB — HCG, QUANTITATIVE, PREGNANCY: hCG, Beta Chain, Quant, S: <1 m[IU]/mL (ref <5)

## 2019-06-22 LAB — ETHANOL: Alcohol, Ethyl (B): <10 mg/dL (ref <10)

## 2019-06-22 LAB — LIPASE, BLOOD: Lipase: 26 U/L (ref 11–51)

## 2019-06-22 LAB — MAGNESIUM: Magnesium: 1.8 mg/dL (ref 1.7–2.4)

## 2019-06-22 LAB — POCT PREGNANCY, URINE: Preg Test, Ur: NEGATIVE

## 2019-06-22 LAB — ACETAMINOPHEN LEVEL: Acetaminophen (Tylenol), Serum: <10 ug/mL — ABNORMAL LOW (ref 10–30)

## 2019-06-22 LAB — SALICYLATE LEVEL: Salicylate Lvl: <7 mg/dL (ref 2.8–30.0)

## 2019-06-22 NOTE — ED Triage Notes (Addendum)
Pt brought to ED Rm 23 from #rd floor L and D after having a possible syncopal episode. Pt had arrived via EMS earlier and went to L and D after reporting she was approximately [redacted] weeks pregnant. L and D reports they determined she was not pregnant and while she was in their room she passed out. FSBS was obtained and was 357. Rapid response was called and the responding MD requested pt be sent to ED for evaluation. Pt is awake and cooperative at this time. Denies SI/HI and is agreeable to be checked out due to passing out. Pt states she was told by [facility] that she was approximately [redacted] weeks pregnant and "my stomach is hard and I've never been this big" Pt also reports lower abd pain.

## 2019-06-22 NOTE — Discharge Instructions (Signed)
As we discussed, your lab work was normal today except for your elevated blood sugar which is normal for you.  You are not pregnant, based on both your blood work and your urine study.  We recommend you follow-up with your regular doctors including your medical doctor and your psychiatrist and discuss your recent symptoms including your confusion over your possible pregnancy.  Please call tomorrow morning for the next available appointment.  Return to the emergency department if you develop new or worsening symptoms that concern you.

## 2019-06-22 NOTE — ED Provider Notes (Signed)
[facility] Emergency Department Provider Note  ____________________________________________   First MD Initiated Contact with Patient 06/22/19 [phone]     (approximate)  I have reviewed the triage vital signs and the nursing notes.   HISTORY  Chief Complaint Medical Clearance    HPI Cassandra Flynn is a 23 y.o. female with medical history as listed below who presents for evaluation after having an episode of possible syncope or pseudoseizure while in labor and delivery.  Reportedly she came to the labor and delivery floor claiming that she was [redacted] weeks pregnant.  They quickly determined that she was not, in fact, pregnant, and after they told her that she was found on the floor and says that she passed out.  She is hyperglycemic at baseline and after a rapid response was called it was determined that she should be taken to the emergency department.  The patient says that she has had several positive pregnancy tests at home and at her doctor's office [facility]), but also acknowledges that she has been in the emergency department several times over the last few months and has had negative pregnancy test.  She denies suicidal ideation and homicidal ideation.  She reports no pain at this time although she does report that her stomach is been getting bigger and that she has had occasional abdominal pain though not currently.  She has had no vaginal bleeding.  She has had no contact with COVID-19 patients.  She denies sore throat, chest pain, cough, shortness of breath.  Reportedly the symptoms were severe and nothing in particular made them better or worse.         Past Medical History:  Diagnosis Date  . Asthma   . Diabetes mellitus without complication (HCC)   . Hypertension   . Sleep apnea     Patient Active Problem List   Diagnosis Date Noted  . Uncontrolled diabetes mellitus (HCC) 06/22/2019  . Diabetic ketoacidosis (HCC) 06/22/2019  . Pseudocyesis  06/22/2019  . Conversion disorder 06/22/2019  . Morbid obesity (HCC) 03/24/2017  . Post traumatic stress disorder (PTSD) 06/16/2014  . Bipolar I disorder, most recent episode (or current) mixed, severe, specified as with psychotic behavior 06/16/2014  . ODD (oppositional defiant disorder) 06/16/2014    Past Surgical History:  Procedure Laterality Date  . CESAREAN SECTION    . CHOLECYSTECTOMY    . SKIN GRAFT    . TONSILLECTOMY      Prior to Admission medications   Medication Sig Start Date End Date Taking? Authorizing Provider  albuterol (PROVENTIL HFA;VENTOLIN HFA) 108 (90 Base) MCG/ACT inhaler Inhale 2 puffs into the lungs every 6 (six) hours as needed for wheezing or shortness of breath. 08/11/17   Willy Eddyobinson, Patrick, MD  alum & mag hydroxide-simeth (MAALOX/MYLANTA) 200-200-20 MG/5ML suspension Take 30 mLs by mouth every 4 (four) hours as needed for indigestion or heartburn. 05/05/19   Minna AntisPaduchowski, Kevin, MD  dicyclomine (BENTYL) 10 MG capsule Take 1 capsule (10 mg total) by mouth 3 (three) times daily as needed for up to 5 days for spasms. 03/08/18 03/13/18  Dionne BucySiadecki, Sebastian, MD  glipiZIDE (GLUCOTROL) 5 MG tablet Take 1 tablet (5 mg total) by mouth 2 (two) times daily. 03/30/19 03/29/20  Emily FilbertWilliams, Jonathan E, MD  glipiZIDE (GLUCOTROL) 5 MG tablet Take 1 tablet (5 mg total) by mouth daily. 03/30/19 03/29/20  Emily FilbertWilliams, Jonathan E, MD  HYDROcodone-acetaminophen (NORCO) 5-325 MG tablet Take 1 tablet by mouth every 6 (six) hours as needed for moderate  pain. Patient not taking: Reported on 11/11/2018 08/11/17   Irean HongSung, Jade J, MD  ibuprofen (ADVIL,MOTRIN) 800 MG tablet Take 1 tablet (800 mg total) by mouth every 8 (eight) hours as needed for moderate pain. Patient not taking: Reported on 11/11/2018 08/11/17   Irean HongSung, Jade J, MD  metFORMIN (GLUCOPHAGE) 500 MG tablet Take 2 tablets (1,000 mg total) by mouth 2 (two) times daily with a meal. 03/24/17   Linzie CollinEvans, David James, MD  norelgestromin-ethinyl estradiol  (ORTHO EVRA) 150-35 MCG/24HR transdermal patch Place 1 patch onto the skin once a week. Reported on 02/14/2016    [provider]  OLANZapine (ZYPREXA) 5 MG tablet Take 1 tablet (5 mg total) by mouth at bedtime. Patient not taking: Reported on 11/11/2018 08/26/17   Willy Eddyobinson, Patrick, MD  ondansetron (ZOFRAN ODT) 8 MG disintegrating tablet Take 1 tablet (8 mg total) by mouth every 8 (eight) hours as needed for nausea or vomiting. Patient not taking: Reported on 02/14/2016 08/29/15   Sharman CheekStafford, Phillip, MD  oxyCODONE-acetaminophen (PERCOCET) 5-325 MG tablet Take 1-2 tablets by mouth every 6 (six) hours as needed. Patient not taking: Reported on 11/11/2018 08/12/17   Emily FilbertWilliams, Jonathan E, MD  ranitidine (ZANTAC) 150 MG capsule Take 1 capsule (150 mg total) by mouth 2 (two) times daily. Patient not taking: Reported on 02/14/2016 08/29/15   Sharman CheekStafford, Phillip, MD  traZODone (DESYREL) 25 mg TABS tablet Take 0.5 tablets (25 mg total) by mouth at bedtime as needed for sleep. Patient not taking: Reported on 02/14/2016 06/23/14   Kendrick FriesBlankmann, Meghan, NP    Allergies Penicillins  History reviewed. No pertinent family history.  Social History Social History   Tobacco Use  . Smoking status: Never Smoker  . Smokeless tobacco: Never Used  Substance Use Topics  . Alcohol use: No  . Drug use: No    Review of Systems Constitutional: No fever/chills Eyes: No visual changes. ENT: No sore throat. Cardiovascular: Possible syncopal episode.  Denies chest pain. Respiratory: Denies shortness of breath. Gastrointestinal: No abdominal pain.  No nausea, no vomiting.  No diarrhea.  No constipation. Genitourinary: Negative for dysuria. Musculoskeletal: Negative for neck pain.  Negative for back pain. Integumentary: Negative for rash. Neurological: Negative for headaches, focal weakness or numbness.   ____________________________________________   PHYSICAL EXAM:  VITAL SIGNS: ED Triage Vitals  Enc Vitals  Group     BP 06/22/19 0114 140/89     Pulse Rate 06/22/19 0114 (!) 108     Resp 06/22/19 0114 18     Temp 06/22/19 0114 98.3 F (36.8 C)     Temp Source 06/22/19 0114 Oral     SpO2 06/22/19 0114 98 %     Weight 06/22/19 0128 (!) 175.1 kg (386 lb)     Height 06/22/19 0128 1.803 m (5\' 11" )     Head Circumference --      Peak Flow --      Pain Score 06/22/19 0127 4     Pain Loc --      Pain Edu? --      Excl. in GC? --     Constitutional: Alert and oriented. Well appearing and in no acute distress. Eyes: Conjunctivae are normal.  Head: Atraumatic. Nose: No congestion/rhinnorhea. Mouth/Throat: Mucous membranes are moist. Neck: No stridor.  No meningeal signs.   Cardiovascular: Normal rate, regular rhythm. Good peripheral circulation. Grossly normal heart sounds. Respiratory: Normal respiratory effort.  No retractions. No audible wheezing. Gastrointestinal: Obese.  Not reporting any pain at this time.  Declining abdominal exam. Musculoskeletal: No lower extremity tenderness nor edema. No gross deformities of extremities. Neurologic:  Normal speech and language. No gross focal neurologic deficits are appreciated.  Skin:  Skin is warm, dry and intact. No rash noted. Psychiatric: Mood and affect are normal. Speech and behavior are normal.  ____________________________________________   LABS (all labs ordered are listed, but only abnormal results are displayed)  Labs Reviewed  COMPREHENSIVE METABOLIC PANEL - Abnormal; Notable for the following components:      Result Value   CO2 21 (*)    Glucose, Bld 345 (*)    AST 70 (*)    ALT 66 (*)    All other components within normal limits  URINALYSIS, ROUTINE W REFLEX MICROSCOPIC - Abnormal; Notable for the following components:   Color, Urine YELLOW (*)    APPearance HAZY (*)    Specific Gravity, Urine 1.035 (*)    Glucose, UA >=500 (*)    Ketones, ur 5 (*)    All other components within normal limits  ACETAMINOPHEN LEVEL -  Abnormal; Notable for the following components:   Acetaminophen (Tylenol), Serum <10 (*)    All other components within normal limits  CBC WITH DIFFERENTIAL/PLATELET  HCG, QUANTITATIVE, PREGNANCY  LIPASE, BLOOD  URINE DRUG SCREEN, QUALITATIVE ([facility] ONLY)  MAGNESIUM  SALICYLATE LEVEL  ETHANOL  POCT PREGNANCY, URINE   ____________________________________________  EKG  ED ECG REPORT I, Hinda Kehr, the attending physician, personally viewed and interpreted this ECG.  Date: 06/22/2019 EKG Time: 1:35 Rate: 97 Rhythm: normal sinus rhythm QRS Axis: normal Intervals: normal ST/T Wave abnormalities: normal Narrative Interpretation: no evidence of acute ischemia  ____________________________________________  RADIOLOGY   ED MD interpretation: No indication for imaging  Official radiology report(s): No results found.  ____________________________________________   PROCEDURES   Procedure(s) performed (including Critical Care):  Procedures   ____________________________________________   INITIAL IMPRESSION / MDM / ASSESSMENT AND PLAN / ED COURSE  As part of my medical decision making, I reviewed the following data within the [facility] notes reviewed and incorporated, Labs reviewed , EKG interpreted , Old chart reviewed and Notes from prior ED visits   Differential diagnosis includes, but is not limited to, pseudocyesis, delusion of pregnancy, conversion disorder, hypoglycemia, metabolic abnormality such as DKA, acute infection.  The patient's lab work is reassuring except for hyperglycemia which is chronic for her and consistent with prior lab values during other ED visits.  I reviewed notes from her other ED visits and she has been told multiple times that she is not pregnant, but she insists that she has had home pregnancy test that were positive as well as tests at [facility].  She claims to have an ultrasound scheduled with [facility] in  about 10 days.  I asked her if she wants to be pregnant and she said no, she already has 2 children and does not need to be pregnant again.  She did not show a significant emotional reaction when I assured her that she is not pregnant based on both her blood work and her urine pregnancy test and she just said that she was ready to go.  She is in no distress and I have no means by which to hold her as she does not meet involuntary commitment criteria and she does not represent a danger to herself at this time.  I offered psychiatric evaluation tonight but she wants to go home.  I provided some follow-up information and she says  she already has a psychiatrist and I strongly encouraged her to call the psychiatrist to discuss her symptoms and delusions of pregnancy.  She says that she understands and agrees and I also reminded her to continue taking her diabetes medicine.  She is ambulatory without any difficulty and reports no signs or symptoms of distress at this time.  She has a normal anion gap and there is no evidence of an emergent medical condition at this time that requires treatment.          ____________________________________________  FINAL CLINICAL IMPRESSION(S) / ED DIAGNOSES  Final diagnoses:  Pseudocyesis  Hyperglycemia     MEDICATIONS GIVEN DURING THIS VISIT:  Medications - No data to display   ED Discharge Orders    None      *Please note:  Cassandra Flynn was evaluated in Emergency Department on 06/22/2019 for the symptoms described in the history of present illness. She was evaluated in the context of the global COVID-19 pandemic, which necessitated consideration that the patient might be at risk for infection with the SARS-CoV-2 virus that causes COVID-19. Institutional protocols and algorithms that pertain to the evaluation of patients at risk for COVID-19 are in a state of rapid change based on information released by regulatory bodies including the CDC and federal  and state organizations. These policies and algorithms were followed during the patient's care in the ED.  Some ED evaluations and interventions may be delayed as a result of limited staffing during the pandemic.*  Note:  This document was prepared using Dragon voice recognition software and may include unintentional dictation errors.   Loleta RoseForbach, Andilyn Bettcher, MD 06/22/19 [phone]

## 2019-06-22 NOTE — ED Notes (Signed)
Dr Karma Greaser in to assess pt.

## 2019-07-10 ENCOUNTER — Other Ambulatory Visit: Payer: Self-pay

## 2019-07-10 ENCOUNTER — Emergency Department
Admission: EM | Admit: 2019-07-10 | Discharge: 2019-07-10 | Disposition: A | Discharge status: Home / Self Care | Payer: Medicaid Other | Attending: Emergency Medicine | Admitting: Emergency Medicine

## 2019-07-10 ENCOUNTER — Encounter: Payer: Self-pay | Admitting: Emergency Medicine

## 2019-07-10 DIAGNOSIS — I10 Essential (primary) hypertension: Secondary | ICD-10-CM | POA: Insufficient documentation

## 2019-07-10 DIAGNOSIS — J45909 Unspecified asthma, uncomplicated: Secondary | ICD-10-CM | POA: Diagnosis not present

## 2019-07-10 DIAGNOSIS — F458 Other somatoform disorders: Secondary | ICD-10-CM | POA: Insufficient documentation

## 2019-07-10 DIAGNOSIS — R109 Unspecified abdominal pain: Secondary | ICD-10-CM | POA: Diagnosis present

## 2019-07-10 DIAGNOSIS — E1165 Type 2 diabetes mellitus with hyperglycemia: Secondary | ICD-10-CM | POA: Diagnosis not present

## 2019-07-10 LAB — COMPREHENSIVE METABOLIC PANEL
ALT: 46 U/L — ABNORMAL HIGH (ref 0–44)
AST: 45 U/L — ABNORMAL HIGH (ref 15–41)
Albumin: 3.6 g/dL (ref 3.5–5.0)
Alkaline Phosphatase: 65 U/L (ref 38–126)
Anion gap: 12 (ref 5–15)
BUN: 13 mg/dL (ref 6–20)
CO2: 22 mmol/L (ref 22–32)
Calcium: 9.5 mg/dL (ref 8.9–10.3)
Chloride: 103 mmol/L (ref 98–111)
Creatinine, Ser: 0.71 mg/dL (ref 0.44–1.00)
GFR calc Af Amer: >60 mL/min (ref >60)
GFR calc non Af Amer: >60 mL/min (ref >60)
Glucose, Bld: 337 mg/dL — ABNORMAL HIGH (ref 70–99)
Potassium: 3.7 mmol/L (ref 3.5–5.1)
Sodium: 137 mmol/L (ref 135–145)
Total Bilirubin: 0.6 mg/dL (ref 0.3–1.2)
Total Protein: 7.3 g/dL (ref 6.5–8.1)

## 2019-07-10 LAB — CBC WITH DIFFERENTIAL/PLATELET
Abs Immature Granulocytes: 0.02 10*3/uL (ref 0.00–0.07)
Basophils Absolute: 0 10*3/uL (ref 0.0–0.1)
Basophils Relative: 0 %
Eosinophils Absolute: 0.1 10*3/uL (ref 0.0–0.5)
Eosinophils Relative: 2 %
HCT: 41.4 % (ref 36.0–46.0)
Hemoglobin: 14.1 g/dL (ref 12.0–15.0)
Immature Granulocytes: 0 %
Lymphocytes Relative: 50 %
Lymphs Abs: 4.2 10*3/uL — ABNORMAL HIGH (ref 0.7–4.0)
MCH: 29.7 pg (ref 26.0–34.0)
MCHC: 34.1 g/dL (ref 30.0–36.0)
MCV: 87.2 fL (ref 80.0–100.0)
Monocytes Absolute: 0.5 10*3/uL (ref 0.1–1.0)
Monocytes Relative: 5 %
Neutro Abs: 3.7 10*3/uL (ref 1.7–7.7)
Neutrophils Relative %: 43 %
Platelets: 241 10*3/uL (ref 150–400)
RBC: 4.75 MIL/uL (ref 3.87–5.11)
RDW: 11.9 % (ref 11.5–15.5)
WBC: 8.5 10*3/uL (ref 4.0–10.5)
nRBC: 0 % (ref 0.0–0.2)

## 2019-07-10 LAB — HCG, QUANTITATIVE, PREGNANCY: hCG, Beta Chain, Quant, S: <1 m[IU]/mL (ref <5)

## 2019-07-10 LAB — LIPASE, BLOOD: Lipase: 26 U/L (ref 11–51)

## 2019-07-10 NOTE — ED Provider Notes (Signed)
[facility] Emergency Department Provider Note       Time seen: ----------------------------------------- 10:49 AM on 07/10/2019 -----------------------------------------   I have reviewed the triage vital signs and the nursing notes.  HISTORY   Chief Complaint Abdominal Pain    HPI Cassandra Flynn is a 23 y.o. female with a history of asthma, diabetes, hypertension, sleep apnea who presents to the ED for abdominal pain.  Patient reports to EMS that her water broke 20 minutes ago and she is 6 months pregnant.  She was seen here by L&D 3 weeks ago was not pregnant according to their assessment.  She arrives oriented at this time, complaining of 10 out of 10 pain.  Past Medical History:  Diagnosis Date  . Asthma   . Diabetes mellitus without complication (HCC)   . Hypertension   . Sleep apnea     Patient Active Problem List   Diagnosis Date Noted  . Uncontrolled diabetes mellitus (HCC) 06/22/2019  . Diabetic ketoacidosis (HCC) 06/22/2019  . 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 Conversion disorder 06/22/2019  . Morbid obesity (HCC) 03/24/2017  . Post traumatic stress disorder (PTSD) 06/16/2014  . Bipolar I disorder, most recent episode (or current) mixed, severe, specified as with psychotic behavior 06/16/2014  . ODD (oppositional defiant disorder) 06/16/2014    Past Surgical History:  Procedure Laterality Date  . CESAREAN SECTION    . CHOLECYSTECTOMY    . SKIN GRAFT    . TONSILLECTOMY      Allergies Penicillins  Social History Social History   Tobacco Use  . Smoking status: Never Smoker  . Smokeless tobacco: Never Used  Substance Use Topics  . Alcohol use: No  . Drug use: No   Review of Systems Constitutional: Negative for fever. Cardiovascular: Negative for chest pain. Respiratory: Negative for shortness of breath. Gastrointestinal: Positive for abdominal pain Genitourinary: Positive for leakage of fluid Musculoskeletal: Negative  for back pain. Skin: Negative for rash. Neurological: Negative for headaches, focal weakness or numbness.  All systems negative/normal/unremarkable except as stated in the HPI  ____________________________________________   PHYSICAL EXAM:  VITAL SIGNS: ED Triage Vitals  Enc Vitals Group     BP 07/10/19 1039 (!) 150/99     Pulse Rate 07/10/19 1039 (!) 108     Resp 07/10/19 1039 (!) 26     Temp 07/10/19 1039 98.7 F (37.1 C)     Temp Source 07/10/19 1039 Oral     SpO2 07/10/19 1039 100 %     Weight 07/10/19 1037 (!) 385 lb 12.9 oz (175 kg)     Height 07/10/19 1037 5\' 6"  (1.676 m)     Head Circumference --      Peak Flow --      Pain Score 07/10/19 1037 10     Pain Loc --      Pain Edu? --      Excl. in GC? --     Constitutional: Alert and oriented.  Mild distress from pain Eyes: Conjunctivae are normal. Normal extraocular movements. ENT      Head: Normocephalic and atraumatic.      Nose: No congestion/rhinnorhea.      Mouth/Throat: Mucous membranes are moist.      Neck: No stridor. Cardiovascular: Normal rate, regular rhythm. No murmurs, rubs, or gallops. Respiratory: Normal respiratory effort without tachypnea nor retractions. Breath sounds are clear and equal bilaterally. No wheezes/rales/rhonchi. Gastrointestinal: Soft and nontender. Normal bowel sounds Musculoskeletal: Nontender with normal range of motion in extremities. No  lower extremity tenderness nor edema. Neurologic:  Normal speech and language. No gross focal neurologic deficits are appreciated.  Skin:  Skin is warm, dry and intact. No rash noted. Psychiatric: Mood and affect are normal. Speech and behavior are normal.  ____________________________________________  ED COURSE:  As part of my medical decision making, I reviewed the following data within the [facility] History obtained from family if available, nursing notes, old chart and ekg, as well as notes from prior ED visits. Patient  presented for abdominal pain, we will assess with labs and imaging as indicated at this time.   Procedures  Cassandra Flynn was evaluated in Emergency Department on 07/10/2019 for the symptoms described in the history of present illness. She was evaluated in the context of the global COVID-19 pandemic, which necessitated consideration that the patient might be at risk for infection with the SARS-CoV-2 virus that causes COVID-19. Institutional protocols and algorithms that pertain to the evaluation of patients at risk for COVID-19 are in a state of rapid change based on information released by regulatory bodies including the CDC and federal and state organizations. These policies and algorithms were followed during the patient's care in the ED.  ____________________________________________   LABS (pertinent positives/negatives)  Labs Reviewed  CBC WITH DIFFERENTIAL/PLATELET - Abnormal; Notable for the following components:      Result Value   Lymphs Abs 4.2 (*)    All other components within normal limits  COMPREHENSIVE METABOLIC PANEL - Abnormal; Notable for the following components:   Glucose, Bld 337 (*)    AST 45 (*)    ALT 46 (*)    All other components within normal limits  LIPASE, BLOOD  HCG, QUANTITATIVE, PREGNANCY  URINALYSIS, COMPLETE (UACMP) WITH MICROSCOPIC  POC URINE PREG, ED   ___________________________________________   DIFFERENTIAL DIAGNOSIS   Abdominal pain, pregnancy, pseudocyesis  FINAL ASSESSMENT AND PLAN  Pseudocyesis   Plan: The patient had presented for fictitious pregnancy. Patient's labs revealed in fact that she is not pregnant.  She does have hyperglycemia and is encouraged to have better glycemic control.  I have advised that if she returns with the same complaint we will likely need urgent psychiatric consultation.   Laurence Aly, MD    Note: This note was generated in part or whole with voice recognition software. Voice recognition is  usually quite accurate but there are transcription errors that can and very often do occur. I apologize for any typographical errors that were not detected and corrected.     Earleen Newport, MD 07/10/19 [phone]

## 2019-07-10 NOTE — ED Notes (Signed)
Two unsuccessful IV attempts by Marya Amsler, RN. Lab called to come for blood draw.

## 2019-07-10 NOTE — ED Triage Notes (Signed)
Pt here for abdominal pain. Pt reports to EMS that her water broke 20 minutes ago and she is 6 months pregnant. Was seen here in L&D 3 weeks ago and was not pregnant on their assessments.  Pt is oriented at this time.

## 2019-09-02 ENCOUNTER — Emergency Department
Admission: EM | Admit: 2019-09-02 | Discharge: 2019-09-02 | Disposition: A | Discharge status: Home / Self Care | Payer: Medicaid Other | Attending: Emergency Medicine | Admitting: Emergency Medicine

## 2019-09-02 ENCOUNTER — Encounter: Payer: Self-pay | Admitting: Emergency Medicine

## 2019-09-02 ENCOUNTER — Other Ambulatory Visit: Payer: Self-pay

## 2019-09-02 DIAGNOSIS — Z5329 Procedure and treatment not carried out because of patient's decision for other reasons: Secondary | ICD-10-CM | POA: Insufficient documentation

## 2019-09-02 DIAGNOSIS — R109 Unspecified abdominal pain: Secondary | ICD-10-CM | POA: Insufficient documentation

## 2019-09-02 NOTE — ED Notes (Addendum)
First nurse note: pt presented to the ED with c/o labor pains for the past 2 hours, crying, breathing heavy, states she has had 2 CSections and this baby was supposed to be a section as well, states delivering at [facility]. Pts mom anxious, ran in ED to get first nurse for wheelchair and assistance, states pt should be delivering next month. When this RN called 3rd floor, RN states pt is not in labor or pregnant, was seen in July for the same and does not need to come to L&D. Pt will be seen for abd pain and needs a psych evaluation per L&D.

## 2019-09-02 NOTE — ED Notes (Signed)
This RN looked around lobby for pt, called her name, no response.

## 2019-09-02 NOTE — ED Notes (Signed)
This RN called for pt, no response. Checked lobby bathroom as well.

## 2019-09-02 NOTE — ED Notes (Signed)
Pts grandmother to lobby to get screened for L&D. This RN explained to grandmother that pt was being evaluated in ED and was not in L&D at this time. This RN continued to call for pt with no response.

## 2019-09-02 NOTE — ED Notes (Signed)
Prior to being taken back in to lobby, patient speaking in complete sentences with no distress noted. Patient texting on phone and states, "I can't stay here." When asked if patient planned to leave patient states "my daughter." patient continues to look at phone and will not answer this RN further. Patient pushed back to lobby in wheelchair.

## 2019-09-02 NOTE — ED Notes (Signed)
This RN outside to update pts grandma, explained pt stated she was going to leave, didn't want to stay, was on her phone the whole time and didn't want to be stuck for blood work. Grandma stated she was her only ride and that she was not happy that her grand daughter left without telling her. This RN encouraged her to have a conversation with her granddaughter about stating she needs to be seen by L&D and then wanting to leave . Grandmother stated she would have the conversation.

## 2019-09-02 NOTE — ED Provider Notes (Signed)
Patient presented for severe abdominal pain.  I evaluated patient.  Patient endorses being [redacted]weeks pregnant.  She denies any gush of fluids or vaginal bleeding.  We originally discussed moving patient up to the OB area.  However on review of records patient has had prior episodes where she has concerns that she is pregnant but and she is not actually pregnant.  On review of prior hCG she is always been less than <1.  Recommended patient get evaluated in the ER for her abdominal pain as well as pregnancy test.  However patient eloped prior to finishing her work-up.   Vanessa Harney, MD 09/02/19 [phone]

## 2019-09-02 NOTE — ED Triage Notes (Signed)
Patient to ED with friend and states she is in pre-term labor. Patient says cramping started to 2 hours ago. Patient has been seen several times in the past 6 months for similar episodes, however patient has never been found to be pregnant.

## 2019-09-02 NOTE — ED Notes (Signed)
Patient states she normally has to have an ultrasound IV or lab to draw her blood. Will see if lab has a phlebotomist available for blood draw.

## 2019-11-14 ENCOUNTER — Other Ambulatory Visit: Payer: Self-pay

## 2019-11-14 ENCOUNTER — Emergency Department
Admission: EM | Admit: 2019-11-14 | Discharge: 2019-11-15 | Disposition: A | Discharge status: Home / Self Care | Payer: Medicaid Other | Attending: Emergency Medicine | Admitting: Emergency Medicine

## 2019-11-14 DIAGNOSIS — J45909 Unspecified asthma, uncomplicated: Secondary | ICD-10-CM | POA: Diagnosis not present

## 2019-11-14 DIAGNOSIS — R519 Headache, unspecified: Secondary | ICD-10-CM | POA: Diagnosis present

## 2019-11-14 DIAGNOSIS — G43901 Migraine, unspecified, not intractable, with status migrainosus: Secondary | ICD-10-CM | POA: Insufficient documentation

## 2019-11-14 DIAGNOSIS — Z7984 Long term (current) use of oral hypoglycemic drugs: Secondary | ICD-10-CM | POA: Insufficient documentation

## 2019-11-14 DIAGNOSIS — I10 Essential (primary) hypertension: Secondary | ICD-10-CM | POA: Diagnosis not present

## 2019-11-14 DIAGNOSIS — Z79899 Other long term (current) drug therapy: Secondary | ICD-10-CM | POA: Insufficient documentation

## 2019-11-14 DIAGNOSIS — E119 Type 2 diabetes mellitus without complications: Secondary | ICD-10-CM | POA: Diagnosis not present

## 2019-11-14 LAB — POCT PREGNANCY, URINE: Preg Test, Ur: NEGATIVE

## 2019-11-14 MED ORDER — KETOROLAC TROMETHAMINE 30 MG/ML IJ SOLN
15.0000 mg | Freq: Once | INTRAMUSCULAR | Status: AC
  Administered 2019-11-15: 01:00:00 15 mg via INTRAVENOUS
  Filled 2019-11-14: qty 1

## 2019-11-14 MED ORDER — METOCLOPRAMIDE HCL 5 MG/ML IJ SOLN
10.0000 mg | Freq: Once | INTRAMUSCULAR | Status: AC
  Administered 2019-11-15: 10 mg via INTRAVENOUS
  Filled 2019-11-14: qty 2

## 2019-11-14 MED ORDER — BUTALBITAL-APAP-CAFFEINE 50-325-40 MG PO TABS
1.0000 | ORAL_TABLET | Freq: Once | ORAL | Status: AC
  Administered 2019-11-15: 1 via ORAL
  Filled 2019-11-14: qty 1

## 2019-11-14 MED ORDER — SODIUM CHLORIDE 0.9 % IV BOLUS
1000.0000 mL | Freq: Once | INTRAVENOUS | Status: AC
  Administered 2019-11-15: 1000 mL via INTRAVENOUS

## 2019-11-14 MED ORDER — DIPHENHYDRAMINE HCL 50 MG/ML IJ SOLN
50.0000 mg | Freq: Once | INTRAMUSCULAR | Status: AC
  Administered 2019-11-15: 50 mg via INTRAVENOUS
  Filled 2019-11-14: qty 1

## 2019-11-14 NOTE — ED Triage Notes (Signed)
Patient reports migraine for 2 weeks and breast soreness.

## 2019-11-14 NOTE — ED Notes (Signed)
First Nurse: patient to stat desk in no acute distress. Patient with complaint of migraine and breast tenderness times two weeks.

## 2019-11-15 LAB — GLUCOSE, CAPILLARY: Glucose-Capillary: 248 mg/dL — ABNORMAL HIGH (ref 70–99)

## 2019-11-15 MED ORDER — BUTALBITAL-APAP-CAFFEINE 50-325-40 MG PO TABS: 1.0000 | ORAL_TABLET | Freq: Four times a day (QID) | ORAL | 0 refills | Status: AC | PRN

## 2019-11-15 MED ORDER — DROPERIDOL 2.5 MG/ML IJ SOLN
2.5000 mg | Freq: Once | INTRAMUSCULAR | Status: AC
  Administered 2019-11-15: 02:00:00 2.5 mg via INTRAVENOUS
  Filled 2019-11-15: qty 2

## 2019-11-15 NOTE — Discharge Instructions (Signed)

## 2019-11-15 NOTE — ED Provider Notes (Signed)
[facility] Emergency Department Provider Note  ____________________________________________  Time seen: Approximately 12:18 AM  I have reviewed the triage vital signs and the nursing notes.   HISTORY  Chief Complaint Headache   HPI Cassandra Flynn is a 23 y.o. female with a history of morbid obesity, asthma, diabetes, hypertension, bipolar disorder,  migraine headaches  who presents for evaluation of a migraine.  Patient reports that her symptoms have been ongoing for 2 weeks.  She reports daily headaches that start with a visual aura and progressed to a diffuse sharp headache consistent with her prior history of migraines.  No trauma, no fever.  She endorses photophobia, nausea and vomiting which is also consistent with her prior migraine headaches.  She has been taking Tylenol and ibuprofen with no significant relief.  She denies neck stiffness, syncope, changes in vision.  She is also complaining of soreness in her bilateral breasts.  She reports she was supposed to get her menstrual period yesterday but did then come.  She does not take any birth control.  Past Medical History:  Diagnosis Date  . Asthma   . Diabetes mellitus without complication (HCC)   . Hypertension   . Sleep apnea     Patient Active Problem List   Diagnosis Date Noted  . Uncontrolled diabetes mellitus (HCC) 06/22/2019  . Diabetic ketoacidosis (HCC) 06/22/2019  . 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 Conversion disorder 06/22/2019  . Morbid obesity (HCC) 03/24/2017  . Post traumatic stress disorder (PTSD) 06/16/2014  . Bipolar I disorder, most recent episode (or current) mixed, severe, specified as with psychotic behavior 06/16/2014  . ODD (oppositional defiant disorder) 06/16/2014    Past Surgical History:  Procedure Laterality Date  . CESAREAN SECTION    . CHOLECYSTECTOMY    . SKIN GRAFT    . TONSILLECTOMY      Prior to Admission medications   Medication Sig Start Date End  Date Taking? Authorizing Provider  albuterol (PROVENTIL HFA;VENTOLIN HFA) 108 (90 Base) MCG/ACT inhaler Inhale 2 puffs into the lungs every 6 (six) hours as needed for wheezing or shortness of breath. 08/11/17   Willy Eddyobinson, Patrick, MD  alum & mag hydroxide-simeth (MAALOX/MYLANTA) 200-200-20 MG/5ML suspension Take 30 mLs by mouth every 4 (four) hours as needed for indigestion or heartburn. 05/05/19   Minna AntisPaduchowski, Kevin, MD  butalbital-acetaminophen-caffeine (FIORICET) [phone] MG tablet Take 1-2 tablets by mouth every 6 (six) hours as needed for headache. 11/15/19 11/14/20  Nita SickleVeronese, [place], MD  dicyclomine (BENTYL) 10 MG capsule Take 1 capsule (10 mg total) by mouth 3 (three) times daily as needed for up to 5 days for spasms. 03/08/18 03/13/18  Dionne BucySiadecki, Sebastian, MD  glipiZIDE (GLUCOTROL) 5 MG tablet Take 1 tablet (5 mg total) by mouth 2 (two) times daily. 03/30/19 03/29/20  Emily FilbertWilliams, Jonathan E, MD  glipiZIDE (GLUCOTROL) 5 MG tablet Take 1 tablet (5 mg total) by mouth daily. 03/30/19 03/29/20  Emily FilbertWilliams, Jonathan E, MD  HYDROcodone-acetaminophen (NORCO) 5-325 MG tablet Take 1 tablet by mouth every 6 (six) hours as needed for moderate pain. Patient not taking: Reported on 11/11/2018 08/11/17   Irean HongSung, Jade J, MD  ibuprofen (ADVIL,MOTRIN) 800 MG tablet Take 1 tablet (800 mg total) by mouth every 8 (eight) hours as needed for moderate pain. Patient not taking: Reported on 11/11/2018 08/11/17   Irean HongSung, Jade J, MD  metFORMIN (GLUCOPHAGE) 500 MG tablet Take 2 tablets (1,000 mg total) by mouth 2 (two) times daily with a meal. 03/24/17   Linzie CollinEvans, David James,  MD  norelgestromin-ethinyl estradiol (ORTHO EVRA) 150-35 MCG/24HR transdermal patch Place 1 patch onto the skin once a week. Reported on 02/14/2016    [provider]  OLANZapine (ZYPREXA) 5 MG tablet Take 1 tablet (5 mg total) by mouth at bedtime. Patient not taking: Reported on 11/11/2018 08/26/17   Willy Eddy, MD  ondansetron (ZOFRAN ODT) 8 MG disintegrating  tablet Take 1 tablet (8 mg total) by mouth every 8 (eight) hours as needed for nausea or vomiting. Patient not taking: Reported on 02/14/2016 08/29/15   Sharman Cheek, MD  oxyCODONE-acetaminophen (PERCOCET) 5-325 MG tablet Take 1-2 tablets by mouth every 6 (six) hours as needed. Patient not taking: Reported on 11/11/2018 08/12/17   Emily Filbert, MD  ranitidine (ZANTAC) 150 MG capsule Take 1 capsule (150 mg total) by mouth 2 (two) times daily. Patient not taking: Reported on 02/14/2016 08/29/15   Sharman Cheek, MD  traZODone (DESYREL) 25 mg TABS tablet Take 0.5 tablets (25 mg total) by mouth at bedtime as needed for sleep. Patient not taking: Reported on 02/14/2016 06/23/14   Kendrick Fries, NP    Allergies Penicillins  No family history on file.  Social History Social History   Tobacco Use  . Smoking status: Never Smoker  . Smokeless tobacco: Never Used  Substance Use Topics  . Alcohol use: No  . Drug use: No    Review of Systems  Constitutional: Negative for fever. Eyes: Negative for visual changes. ENT: Negative for sore throat. Neck: No neck pain  Cardiovascular: Negative for chest pain. Respiratory: Negative for shortness of breath. Breast: + b/l soreness Gastrointestinal: Negative for abdominal pain, vomiting or diarrhea. Genitourinary: Negative for dysuria. Musculoskeletal: Negative for back pain. Skin: Negative for rash. Neurological: Negative for weakness or numbness. + HA Psych: No SI or HI  ____________________________________________   PHYSICAL EXAM:  VITAL SIGNS: ED Triage Vitals  Enc Vitals Group     BP 11/14/19 2014 (!) 173/100     Pulse Rate 11/14/19 2014 (!) 116     Resp 11/14/19 2014 18     Temp 11/14/19 2014 99.3 F (37.4 C)     Temp Source 11/14/19 2014 Oral     SpO2 11/14/19 2014 99 %     Weight 11/14/19 2015 (!) 366 lb (166 kg)     Height 11/14/19 2015  (1.803 m)     Head Circumference --      Peak Flow --      Pain Score  11/14/19 2015 10     Pain Loc --      Pain Edu? --      Excl. in GC? --     Constitutional: Alert and oriented. Well appearing and in no apparent distress. HEENT:      Head: Normocephalic and atraumatic.         Eyes: Conjunctivae are normal. Sclera is non-icteric.       Mouth/Throat: Mucous membranes are moist.       Neck: Supple with no signs of meningismus. Cardiovascular: Regular rate and rhythm. No murmurs, gallops, or rubs. 2+ symmetrical distal pulses are present in all extremities. No JVD. Respiratory: Normal respiratory effort. Lungs are clear to auscultation bilaterally. No wheezes, crackles, or rhonchi.  Breast: No chest deformity or asymmetry. Normal contours. No nodules, masses, tenderness, or axillary adenopathy. No nipple discharge.  Gastrointestinal: Soft, non tender, and non distended with positive bowel sounds. No rebound or guarding. Musculoskeletal: Nontender with normal range of motion in all extremities. No  edema, cyanosis, or erythema of extremities. Neurologic: Normal speech and language.  Face symmetric, intact extraocular movements, pupils are equal round and reactive, strength and sensation normal x4, no pronator drift or dysmetria, normal gait Skin: Skin is warm, dry and intact. No rash noted. Psychiatric: Mood and affect are normal. Speech and behavior are normal.  ____________________________________________   LABS (all labs ordered are listed, but only abnormal results are displayed)  Labs Reviewed  POC URINE PREG, ED  POCT PREGNANCY, URINE  CBG MONITORING, ED   ____________________________________________  EKG  none  ____________________________________________  RADIOLOGY  none  ____________________________________________   PROCEDURES  Procedure(s) performed: None Procedures Critical Care performed:  None ____________________________________________   INITIAL IMPRESSION / ASSESSMENT AND PLAN / ED COURSE  23 y.o. female with a  history of morbid obesity, asthma, diabetes, hypertension, bipolar disorder,  migraine headaches  who presents for evaluation of a migraine.   Low suspicion for more serious or life threatening etiology of HA based on history and exam. No sudden onset thunderclap HA, onset with exertion, vomiting, focal neurologic deficits, to suggest increased risk of subarachnoid hemorrhage. No fever, neck pain, neck stiffness, or meningismus on exam to suggest meningitis. No fevers, altered mental status, unusual behavior to suggest encephalitis. No focal neurologic deficits by history or exam to suggest central venous thrombosis. No constitutional symptoms including fever, fatigue, weight loss, temporal scalp tenderness, jaw claudication, visual loss, to suggest temporal arteritis. No immunocompromise to suggest increased risk for intracranial infectious disease. No visual changes or findings on ocular exam to suggest acute angle closure glaucoma. No reports of toxic exposures including carbon monoxide or other household members with similar symptoms.  We will check blood glucose and pregnancy test.  Will treat with migraine cocktail.  Breast exam is normal most likely just mild tenderness due to proximity to her menses.  Patient does have elevated blood pressure 173/100 in the setting of pain.  She reports never being on blood pressure medication in the past although she does have a diagnosis of hypertension.  Will recheck after pain is under control.    _________________________ 2:26 AM on 11/15/2019 -----------------------------------------  Patient feels markedly improved, remains neurologically intact.  Blood pressure has improved once the pain has resolved.  Recommended close follow-up with PCP for blood pressure management.  Will provide a prescription for sent.  Patient is now stable for discharge home with outpatient follow-up.  Discussed my standard return precautions    As part of my medical decision  making, I reviewed the following data within the electronic MEDICAL RECORD NUMBER Notes from prior ED visits and [place] Controlled Substance Database   Please note:  Patient was evaluated in Emergency Department today for the symptoms described in the history of present illness. Patient was evaluated in the context of the global COVID-19 pandemic, which necessitated consideration that the patient might be at risk for infection with the SARS-CoV-2 virus that causes COVID-19. Institutional protocols and algorithms that pertain to the evaluation of patients at risk for COVID-19 are in a state of rapid change based on information released by regulatory bodies including the CDC and federal and state organizations. These policies and algorithms were followed during the patient's care in the ED.  Some ED evaluations and interventions may be delayed as a result of limited staffing during the pandemic.   ____________________________________________   FINAL CLINICAL IMPRESSION(S) / ED DIAGNOSES   Final diagnoses:  Migraine with status migrainosus, not intractable, unspecified migraine type  NEW MEDICATIONS STARTED DURING THIS VISIT:  ED Discharge Orders         Ordered    butalbital-acetaminophen-caffeine (FIORICET) 50-325-40 MG tablet  Every 6 hours PRN     11/15/19 0111           Note:  This document was prepared using Dragon voice recognition software and may include unintentional dictation errors.    Rudene Re, MD 11/15/19 [phone]

## 2020-04-03 ENCOUNTER — Other Ambulatory Visit: Payer: Self-pay

## 2020-04-03 ENCOUNTER — Emergency Department
Admission: EM | Admit: 2020-04-03 | Discharge: 2020-04-03 | Disposition: A | Discharge status: Home / Self Care | Payer: Medicaid Other | Attending: Emergency Medicine | Admitting: Emergency Medicine

## 2020-04-03 DIAGNOSIS — Z79899 Other long term (current) drug therapy: Secondary | ICD-10-CM | POA: Insufficient documentation

## 2020-04-03 DIAGNOSIS — K0889 Other specified disorders of teeth and supporting structures: Secondary | ICD-10-CM | POA: Diagnosis present

## 2020-04-03 DIAGNOSIS — R519 Headache, unspecified: Secondary | ICD-10-CM | POA: Diagnosis not present

## 2020-04-03 DIAGNOSIS — Z7984 Long term (current) use of oral hypoglycemic drugs: Secondary | ICD-10-CM | POA: Diagnosis not present

## 2020-04-03 DIAGNOSIS — R109 Unspecified abdominal pain: Secondary | ICD-10-CM | POA: Diagnosis not present

## 2020-04-03 DIAGNOSIS — E119 Type 2 diabetes mellitus without complications: Secondary | ICD-10-CM | POA: Diagnosis not present

## 2020-04-03 DIAGNOSIS — K029 Dental caries, unspecified: Secondary | ICD-10-CM | POA: Diagnosis not present

## 2020-04-03 DIAGNOSIS — H9201 Otalgia, right ear: Secondary | ICD-10-CM | POA: Insufficient documentation

## 2020-04-03 DIAGNOSIS — I10 Essential (primary) hypertension: Secondary | ICD-10-CM | POA: Insufficient documentation

## 2020-04-03 LAB — URINALYSIS, COMPLETE (UACMP) WITH MICROSCOPIC
Bacteria, UA: NONE SEEN
Bilirubin Urine: NEGATIVE
Glucose, UA: >=500 mg/dL — AB
Ketones, ur: NEGATIVE mg/dL
Nitrite: NEGATIVE
Protein, ur: NEGATIVE mg/dL
Specific Gravity, Urine: 1.025 (ref 1.005–1.030)
pH: 5 (ref 5.0–8.0)

## 2020-04-03 LAB — POCT PREGNANCY, URINE: Preg Test, Ur: NEGATIVE

## 2020-04-03 LAB — GLUCOSE, CAPILLARY: Glucose-Capillary: 342 mg/dL — ABNORMAL HIGH (ref 70–99)

## 2020-04-03 MED ORDER — KETOROLAC TROMETHAMINE 30 MG/ML IJ SOLN
30.0000 mg | Freq: Once | INTRAMUSCULAR | Status: AC
  Administered 2020-04-03: 10:00:00 30 mg via INTRAMUSCULAR
  Filled 2020-04-03: qty 1

## 2020-04-03 NOTE — ED Provider Notes (Signed)
[facility] Emergency Department Provider Note   ____________________________________________   First MD Initiated Contact with Patient 04/03/20 [phone]     (approximate)  I have reviewed the triage vital signs and the nursing notes.   HISTORY  Chief Complaint Dental Pain, Otalgia, Headache, and Abdominal Pain    HPI Cassandra Flynn is a 24 y.o. female with past medical history of hypertension, diabetes, and pseudocyesis who presents to the ED complaining of dental pain and abdominal pain.  Patient reports that she has had 2 days of right upper and lower dental pain, which she describes as a throbbing that is sensitive to cold foods.  She states pain is extended into her right ear and is causing a right-sided headache.  She denies any swelling to her face and has not had any difficulty opening her jaw or swallowing.  She also states that she has "hardness in her stomach".  She denies any abdominal pain and has not had any vomiting or diarrhea.  She denies any dysuria, hematuria, vaginal bleeding, or vaginal discharge.  She does state that her LMP was in February and she is concerned she might be pregnant, also endorses urinary frequency.        Past Medical History:  Diagnosis Date  . Asthma   . Diabetes mellitus without complication ([place])   . Hypertension   . Sleep apnea     Patient Active Problem List   Diagnosis Date Noted  . Uncontrolled diabetes mellitus ([place]) 06/22/2019  . Diabetic ketoacidosis ([place]) 06/22/2019  . 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 Conversion disorder 06/22/2019  . Morbid obesity ([place]) 03/24/2017  . Post traumatic stress disorder (PTSD) 06/16/2014  . Bipolar I disorder, most recent episode (or current) mixed, severe, specified as with psychotic behavior 06/16/2014  . ODD (oppositional defiant disorder) 06/16/2014    Past Surgical History:  Procedure Laterality Date  . CESAREAN SECTION    . CHOLECYSTECTOMY    . SKIN GRAFT    .  TONSILLECTOMY      Prior to Admission medications   Medication Sig Start Date End Date Taking? Authorizing Provider  albuterol (PROVENTIL HFA;VENTOLIN HFA) 108 (90 Base) MCG/ACT inhaler Inhale 2 puffs into the lungs every 6 (six) hours as needed for wheezing or shortness of breath. 08/11/17   Merlyn Lot, MD  alum & mag hydroxide-simeth (MAALOX/MYLANTA) 200-200-20 MG/5ML suspension Take 30 mLs by mouth every 4 (four) hours as needed for indigestion or heartburn. 05/05/19   Harvest Dark, MD  butalbital-acetaminophen-caffeine (FIORICET) [phone] MG tablet Take 1-2 tablets by mouth every 6 (six) hours as needed for headache. 11/15/19 11/14/20  Rudene Re, MD  dicyclomine (BENTYL) 10 MG capsule Take 1 capsule (10 mg total) by mouth 3 (three) times daily as needed for up to 5 days for spasms. 03/08/18 03/13/18  Arta Silence, MD  glipiZIDE (GLUCOTROL) 5 MG tablet Take 1 tablet (5 mg total) by mouth 2 (two) times daily. 03/30/19 03/29/20  Earleen Newport, MD  glipiZIDE (GLUCOTROL) 5 MG tablet Take 1 tablet (5 mg total) by mouth daily. 03/30/19 03/29/20  Earleen Newport, MD  HYDROcodone-acetaminophen (NORCO) 5-325 MG tablet Take 1 tablet by mouth every 6 (six) hours as needed for moderate pain. Patient not taking: Reported on 11/11/2018 08/11/17   Paulette Blanch, MD  ibuprofen (ADVIL,MOTRIN) 800 MG tablet Take 1 tablet (800 mg total) by mouth every 8 (eight) hours as needed for moderate pain. Patient not taking: Reported on 11/11/2018 08/11/17   Lurline Hare  J, MD  metFORMIN (GLUCOPHAGE) 500 MG tablet Take 2 tablets (1,000 mg total) by mouth 2 (two) times daily with a meal. 03/24/17   Linzie Collin, MD  norelgestromin-ethinyl estradiol (ORTHO EVRA) 150-35 MCG/24HR transdermal patch Place 1 patch onto the skin once a week. Reported on 02/14/2016    [provider]  OLANZapine (ZYPREXA) 5 MG tablet Take 1 tablet (5 mg total) by mouth at bedtime. Patient not taking: Reported on  11/11/2018 08/26/17   Willy Eddy, MD  ondansetron (ZOFRAN ODT) 8 MG disintegrating tablet Take 1 tablet (8 mg total) by mouth every 8 (eight) hours as needed for nausea or vomiting. Patient not taking: Reported on 02/14/2016 08/29/15   Sharman Cheek, MD  oxyCODONE-acetaminophen (PERCOCET) 5-325 MG tablet Take 1-2 tablets by mouth every 6 (six) hours as needed. Patient not taking: Reported on 11/11/2018 08/12/17   Emily Filbert, MD  ranitidine (ZANTAC) 150 MG capsule Take 1 capsule (150 mg total) by mouth 2 (two) times daily. Patient not taking: Reported on 02/14/2016 08/29/15   Sharman Cheek, MD  traZODone (DESYREL) 25 mg TABS tablet Take 0.5 tablets (25 mg total) by mouth at bedtime as needed for sleep. Patient not taking: Reported on 02/14/2016 06/23/14   Kendrick Fries, NP    Allergies Penicillins  No family history on file.  Social History Social History   Tobacco Use  . Smoking status: Never Smoker  . Smokeless tobacco: Never Used  Substance Use Topics  . Alcohol use: No  . Drug use: No    Review of Systems  Constitutional: No fever/chills Eyes: No visual changes. ENT: No sore throat.  Positive for dental pain. Cardiovascular: Denies chest pain. Respiratory: Denies shortness of breath. Gastrointestinal: No abdominal pain.  No nausea, no vomiting.  No diarrhea.  No constipation. Genitourinary: Negative for dysuria.  Positive for frequency. Musculoskeletal: Negative for back pain. Skin: Negative for rash. Neurological: Negative for headaches, focal weakness or numbness.  ____________________________________________   PHYSICAL EXAM:  VITAL SIGNS: ED Triage Vitals  Enc Vitals Group     BP 04/03/20 0929 (!) 204/112     Pulse Rate 04/03/20 0929 100     Resp 04/03/20 0929 16     Temp 04/03/20 0929 98.6 F (37 C)     Temp Source 04/03/20 0929 Oral     SpO2 04/03/20 0929 98 %     Weight 04/03/20 0929 (!) 373 lb (169.2 kg)     Height 04/03/20 0929 5\' 11"   (1.803 m)     Head Circumference --      Peak Flow --      Pain Score 04/03/20 0936 10     Pain Loc --      Pain Edu? --      Excl. in GC? --     Constitutional: Alert and oriented. Eyes: Conjunctivae are normal. Head: Atraumatic. Nose: No congestion/rhinnorhea. Mouth/Throat: Mucous membranes are moist.  Tenderness to palpation over right upper and lower molars with no erythema or edema. Neck: Normal ROM Cardiovascular: Normal rate, regular rhythm. Grossly normal heart sounds. Respiratory: Normal respiratory effort.  No retractions. Lungs CTAB. Gastrointestinal: Soft and nontender. No distention. Genitourinary: deferred Musculoskeletal: No lower extremity tenderness nor edema. Neurologic:  Normal speech and language. No gross focal neurologic deficits are appreciated. Skin:  Skin is warm, dry and intact. No rash noted. Psychiatric: Mood and affect are normal. Speech and behavior are normal.  ____________________________________________   LABS (all labs ordered are listed, but only abnormal  results are displayed)  Labs Reviewed  URINALYSIS, COMPLETE (UACMP) WITH MICROSCOPIC - Abnormal; Notable for the following components:      Result Value   Color, Urine STRAW (*)    APPearance CLEAR (*)    Glucose, UA >=500 (*)    Hgb urine dipstick MODERATE (*)    Leukocytes,Ua TRACE (*)    All other components within normal limits  GLUCOSE, CAPILLARY - Abnormal; Notable for the following components:   Glucose-Capillary 342 (*)    All other components within normal limits  POC URINE PREG, ED  POCT PREGNANCY, URINE     PROCEDURES  Procedure(s) performed (including Critical Care):  Procedures   ____________________________________________   INITIAL IMPRESSION / ASSESSMENT AND PLAN / ED COURSE       24 year old female with history of hypertension, diabetes, and pseudocyesis presents to the ED primarily complaining of right upper and lower dental pain.  She has no signs of  infection or abscess in this area, pain is likely due to dental caries and she will need to follow-up with dentistry.  We will treat pain with IM Toradol here in the ED.  Additionally, patient had complained of firmness to her abdomen but denies any pain and does not have any tenderness on exam.  Pregnancy testing is negative and UA shows no evidence of infection, however patient reports urinary frequency and as is likely due to hyperglycemia.  Glucose level is greater than 300, but she does not appear dehydrated and there are no symptoms to suggest DKA.  She is also hypertensive, but asymptomatic with this and counseled to follow-up with PCP regarding hypertension and hyperglycemia.  She was provided with referral information for dentistry, no antibiotics indicated at this time.  Patient counseled to return to the ED for new or worsening symptoms, patient agrees with plan.      ____________________________________________   FINAL CLINICAL IMPRESSION(S) / ED DIAGNOSES  Final diagnoses:  Pain due to dental caries     ED Discharge Orders    None       Note:  This document was prepared using Dragon voice recognition software and may include unintentional dictation errors.   Chesley Noon, MD 04/03/20 1537

## 2020-04-03 NOTE — ED Triage Notes (Addendum)
Pt arrived via POV with reports of dental pain, ear ache and headache x 2 days.    Pt states its painful to eat and talk, pt also has hot and cold sensitivity on the right side of mouth.  Pt also states headache and ear pain started the same time the dental pain started.  Taking ibuprofen and tylenol.  Pt does have a dentist.  Pt also requesting to be evaluated for the "hardness in her stomach"

## 2020-06-03 ENCOUNTER — Encounter: Payer: Self-pay | Admitting: *Deleted

## 2020-12-28 ENCOUNTER — Other Ambulatory Visit: Payer: Medicaid Other

## 2021-04-02 ENCOUNTER — Emergency Department: Payer: Medicaid Other

## 2021-04-02 ENCOUNTER — Inpatient Hospital Stay
Admission: EM | Admit: 2021-04-02 | Discharge: 2021-04-07 | DRG: 871 | Disposition: A | Discharge status: Home / Self Care | Payer: Medicaid Other | Attending: Family Medicine | Admitting: Family Medicine

## 2021-04-02 ENCOUNTER — Observation Stay: Payer: Medicaid Other

## 2021-04-02 ENCOUNTER — Other Ambulatory Visit: Payer: Self-pay

## 2021-04-02 DIAGNOSIS — E785 Hyperlipidemia, unspecified: Secondary | ICD-10-CM | POA: Diagnosis present

## 2021-04-02 DIAGNOSIS — Z880 Allergy status to penicillin: Secondary | ICD-10-CM

## 2021-04-02 DIAGNOSIS — I639 Cerebral infarction, unspecified: Secondary | ICD-10-CM | POA: Diagnosis not present

## 2021-04-02 DIAGNOSIS — E1165 Type 2 diabetes mellitus with hyperglycemia: Secondary | ICD-10-CM | POA: Diagnosis not present

## 2021-04-02 DIAGNOSIS — F449 Dissociative and conversion disorder, unspecified: Secondary | ICD-10-CM | POA: Diagnosis present

## 2021-04-02 DIAGNOSIS — Z7984 Long term (current) use of oral hypoglycemic drugs: Secondary | ICD-10-CM

## 2021-04-02 DIAGNOSIS — I10 Essential (primary) hypertension: Secondary | ICD-10-CM | POA: Diagnosis present

## 2021-04-02 DIAGNOSIS — Z79899 Other long term (current) drug therapy: Secondary | ICD-10-CM

## 2021-04-02 DIAGNOSIS — A0839 Other viral enteritis: Secondary | ICD-10-CM | POA: Diagnosis present

## 2021-04-02 DIAGNOSIS — J45909 Unspecified asthma, uncomplicated: Secondary | ICD-10-CM | POA: Diagnosis present

## 2021-04-02 DIAGNOSIS — A4189 Other specified sepsis: Principal | ICD-10-CM | POA: Diagnosis present

## 2021-04-02 DIAGNOSIS — R4701 Aphasia: Secondary | ICD-10-CM | POA: Diagnosis present

## 2021-04-02 DIAGNOSIS — U071 COVID-19: Secondary | ICD-10-CM | POA: Diagnosis present

## 2021-04-02 DIAGNOSIS — J1282 Pneumonia due to coronavirus disease 2019: Secondary | ICD-10-CM | POA: Diagnosis present

## 2021-04-02 DIAGNOSIS — Z6841 Body Mass Index (BMI) 40.0 and over, adult: Secondary | ICD-10-CM

## 2021-04-02 DIAGNOSIS — IMO0002 Reserved for concepts with insufficient information to code with codable children: Secondary | ICD-10-CM | POA: Diagnosis present

## 2021-04-02 DIAGNOSIS — G4730 Sleep apnea, unspecified: Secondary | ICD-10-CM | POA: Diagnosis present

## 2021-04-02 LAB — APTT: aPTT: 26 seconds (ref 24–36)

## 2021-04-02 LAB — DIFFERENTIAL
Abs Immature Granulocytes: 0.07 10*3/uL (ref 0.00–0.07)
Basophils Absolute: 0 10*3/uL (ref 0.0–0.1)
Basophils Relative: 0 %
Eosinophils Absolute: 0.2 10*3/uL (ref 0.0–0.5)
Eosinophils Relative: 1 %
Immature Granulocytes: 1 %
Lymphocytes Relative: 24 %
Lymphs Abs: 3 10*3/uL (ref 0.7–4.0)
Monocytes Absolute: 0.8 10*3/uL (ref 0.1–1.0)
Monocytes Relative: 6 %
Neutro Abs: 8.6 10*3/uL — ABNORMAL HIGH (ref 1.7–7.7)
Neutrophils Relative %: 68 %

## 2021-04-02 LAB — URINE DRUG SCREEN, QUALITATIVE (ARMC ONLY)
Amphetamines, Ur Screen: NOT DETECTED
Barbiturates, Ur Screen: NOT DETECTED
Benzodiazepine, Ur Scrn: NOT DETECTED
Cannabinoid 50 Ng, Ur ~~LOC~~: POSITIVE — AB
Cocaine Metabolite,Ur ~~LOC~~: NOT DETECTED
MDMA (Ecstasy)Ur Screen: NOT DETECTED
Methadone Scn, Ur: NOT DETECTED
Opiate, Ur Screen: NOT DETECTED
Phencyclidine (PCP) Ur S: NOT DETECTED
Tricyclic, Ur Screen: NOT DETECTED

## 2021-04-02 LAB — URINALYSIS, ROUTINE W REFLEX MICROSCOPIC
Bilirubin Urine: NEGATIVE
Glucose, UA: >=500 mg/dL — AB
Ketones, ur: 5 mg/dL — AB
Leukocytes,Ua: NEGATIVE
Nitrite: NEGATIVE
Protein, ur: NEGATIVE mg/dL
Specific Gravity, Urine: 1.043 — ABNORMAL HIGH (ref 1.005–1.030)
pH: 6 (ref 5.0–8.0)

## 2021-04-02 LAB — RESP PANEL BY RT-PCR (FLU A&B, COVID) ARPGX2
Influenza A by PCR: NEGATIVE
Influenza B by PCR: NEGATIVE
SARS Coronavirus 2 by RT PCR: POSITIVE — AB

## 2021-04-02 LAB — COMPREHENSIVE METABOLIC PANEL
ALT: 32 U/L (ref 0–44)
AST: 26 U/L (ref 15–41)
Albumin: 3.9 g/dL (ref 3.5–5.0)
Alkaline Phosphatase: 88 U/L (ref 38–126)
Anion gap: 14 (ref 5–15)
BUN: 7 mg/dL (ref 6–20)
CO2: 22 mmol/L (ref 22–32)
Calcium: 9.3 mg/dL (ref 8.9–10.3)
Chloride: 98 mmol/L (ref 98–111)
Creatinine, Ser: 0.68 mg/dL (ref 0.44–1.00)
GFR, Estimated: >60 mL/min (ref >60)
Glucose, Bld: 369 mg/dL — ABNORMAL HIGH (ref 70–99)
Potassium: 3.6 mmol/L (ref 3.5–5.1)
Sodium: 134 mmol/L — ABNORMAL LOW (ref 135–145)
Total Bilirubin: 0.9 mg/dL (ref 0.3–1.2)
Total Protein: 8.1 g/dL (ref 6.5–8.1)

## 2021-04-02 LAB — PROTIME-INR
INR: 1 (ref 0.8–1.2)
Prothrombin Time: 13.4 seconds (ref 11.4–15.2)

## 2021-04-02 LAB — CBC
HCT: 40.6 % (ref 36.0–46.0)
Hemoglobin: 13.9 g/dL (ref 12.0–15.0)
MCH: 29 pg (ref 26.0–34.0)
MCHC: 34.2 g/dL (ref 30.0–36.0)
MCV: 84.6 fL (ref 80.0–100.0)
Platelets: 278 10*3/uL (ref 150–400)
RBC: 4.8 MIL/uL (ref 3.87–5.11)
RDW: 12.2 % (ref 11.5–15.5)
WBC: 12.6 10*3/uL — ABNORMAL HIGH (ref 4.0–10.5)
nRBC: 0 % (ref 0.0–0.2)

## 2021-04-02 LAB — ETHANOL: Alcohol, Ethyl (B): <10 mg/dL (ref <10)

## 2021-04-02 LAB — HCG, QUANTITATIVE, PREGNANCY: hCG, Beta Chain, Quant, S: <1 m[IU]/mL (ref <5)

## 2021-04-02 LAB — PREGNANCY, URINE: Preg Test, Ur: NEGATIVE

## 2021-04-02 MED ORDER — ONDANSETRON HCL 4 MG PO TABS
4.0000 mg | ORAL_TABLET | Freq: Four times a day (QID) | ORAL | Status: DC | PRN
  Administered 2021-04-03: 4 mg via ORAL
  Filled 2021-04-02: qty 1

## 2021-04-02 MED ORDER — INSULIN ASPART 100 UNIT/ML ~~LOC~~ SOLN
0.0000 [IU] | Freq: Every day | SUBCUTANEOUS | Status: DC
  Administered 2021-04-03 – 2021-04-04 (×3): 3 [IU] via SUBCUTANEOUS
  Administered 2021-04-05: 21:00:00 4 [IU] via SUBCUTANEOUS
  Administered 2021-04-06: 22:00:00 3 [IU] via SUBCUTANEOUS
  Filled 2021-04-02 (×5): qty 1

## 2021-04-02 MED ORDER — SODIUM CHLORIDE 0.9 % IV SOLN
100.0000 mg | Freq: Once | INTRAVENOUS | Status: AC
  Administered 2021-04-03: 100 mg via INTRAVENOUS
  Filled 2021-04-02: qty 20

## 2021-04-02 MED ORDER — SODIUM CHLORIDE 0.9 % IV SOLN
100.0000 mg | Freq: Every day | INTRAVENOUS | Status: AC
  Administered 2021-04-04 – 2021-04-07 (×4): 100 mg via INTRAVENOUS
  Filled 2021-04-02 (×2): qty 100
  Filled 2021-04-02 (×2): qty 20

## 2021-04-02 MED ORDER — SODIUM CHLORIDE 0.9 % IV SOLN: 200.0000 mg | Freq: Once | INTRAVENOUS | Status: DC

## 2021-04-02 MED ORDER — LABETALOL HCL 5 MG/ML IV SOLN
5.0000 mg | INTRAVENOUS | Status: DC | PRN
  Administered 2021-04-03: 5 mg via INTRAVENOUS
  Filled 2021-04-02: qty 4

## 2021-04-02 MED ORDER — METFORMIN HCL 500 MG PO TABS: 1000.0000 mg | ORAL_TABLET | Freq: Two times a day (BID) | ORAL | Status: DC

## 2021-04-02 MED ORDER — IOHEXOL 350 MG/ML SOLN
75.0000 mL | Freq: Once | INTRAVENOUS | Status: AC | PRN
  Administered 2021-04-02: 75 mL via INTRAVENOUS

## 2021-04-02 MED ORDER — GUAIFENESIN-DM 100-10 MG/5ML PO SYRP
10.0000 mL | ORAL_SOLUTION | ORAL | Status: DC | PRN
  Administered 2021-04-03: 19:00:00 10 mL via ORAL
  Filled 2021-04-02: qty 10

## 2021-04-02 MED ORDER — ENOXAPARIN SODIUM 80 MG/0.8ML ~~LOC~~ SOLN
0.5000 mg/kg | SUBCUTANEOUS | Status: DC
  Administered 2021-04-03 – 2021-04-06 (×5): 77.5 mg via SUBCUTANEOUS
  Filled 2021-04-02 (×5): qty 0.8

## 2021-04-02 MED ORDER — ALBUTEROL SULFATE HFA 108 (90 BASE) MCG/ACT IN AERS
2.0000 | INHALATION_SPRAY | Freq: Four times a day (QID) | RESPIRATORY_TRACT | Status: DC | PRN
  Administered 2021-04-05: 2 via RESPIRATORY_TRACT
  Filled 2021-04-02 (×2): qty 6.7

## 2021-04-02 MED ORDER — SODIUM CHLORIDE 0.9 % IV BOLUS
1000.0000 mL | Freq: Once | INTRAVENOUS | Status: AC
  Administered 2021-04-02: 1000 mL via INTRAVENOUS

## 2021-04-02 MED ORDER — HYDROCOD POLST-CPM POLST ER 10-8 MG/5ML PO SUER
5.0000 mL | Freq: Two times a day (BID) | ORAL | Status: DC | PRN
  Administered 2021-04-03: 5 mL via ORAL
  Filled 2021-04-02: qty 5

## 2021-04-02 MED ORDER — IOHEXOL 350 MG/ML SOLN
100.0000 mL | Freq: Once | INTRAVENOUS | Status: AC | PRN
  Administered 2021-04-02: 100 mL via INTRAVENOUS

## 2021-04-02 MED ORDER — INSULIN ASPART 100 UNIT/ML ~~LOC~~ SOLN
0.0000 [IU] | Freq: Three times a day (TID) | SUBCUTANEOUS | Status: DC
  Administered 2021-04-03 (×2): 11 [IU] via SUBCUTANEOUS
  Administered 2021-04-03 – 2021-04-04 (×2): 8 [IU] via SUBCUTANEOUS
  Administered 2021-04-04: 11 [IU] via SUBCUTANEOUS
  Administered 2021-04-04 – 2021-04-05 (×2): 8 [IU] via SUBCUTANEOUS
  Administered 2021-04-05: 13:00:00 11 [IU] via SUBCUTANEOUS
  Administered 2021-04-05: 5 [IU] via SUBCUTANEOUS
  Administered 2021-04-06: 11 [IU] via SUBCUTANEOUS
  Administered 2021-04-06: 8 [IU] via SUBCUTANEOUS
  Administered 2021-04-06: 3 [IU] via SUBCUTANEOUS
  Administered 2021-04-07: 5 [IU] via SUBCUTANEOUS
  Administered 2021-04-07: 12:00:00 11 [IU] via SUBCUTANEOUS
  Filled 2021-04-02 (×14): qty 1

## 2021-04-02 MED ORDER — ACETAMINOPHEN 325 MG PO TABS
325.0000 mg | ORAL_TABLET | Freq: Four times a day (QID) | ORAL | Status: DC | PRN
  Administered 2021-04-03 – 2021-04-04 (×6): 325 mg via ORAL
  Filled 2021-04-02 (×6): qty 1

## 2021-04-02 MED ORDER — STROKE: EARLY STAGES OF RECOVERY BOOK: Freq: Once | Status: DC

## 2021-04-02 MED ORDER — ONDANSETRON HCL 4 MG/2ML IJ SOLN: 4.0000 mg | Freq: Four times a day (QID) | INTRAMUSCULAR | Status: DC | PRN

## 2021-04-02 MED ORDER — SODIUM CHLORIDE 0.9 % IV SOLN: 100.0000 mg | Freq: Every day | INTRAVENOUS | Status: DC

## 2021-04-02 MED ORDER — SODIUM CHLORIDE 0.9 % IV SOLN
100.0000 mg | Freq: Once | INTRAVENOUS | Status: DC
  Filled 2021-04-02: qty 20

## 2021-04-02 NOTE — ED Notes (Signed)
Virtual neurologist completing exam at this time.

## 2021-04-02 NOTE — ED Notes (Signed)
CT made aware of new IV advising come to room 2. Pt transported to CT at this time by this RN.

## 2021-04-02 NOTE — ED Notes (Signed)
Patient transported to CT 

## 2021-04-02 NOTE — ED Notes (Signed)
Neurologist on screen for code stroke awaiting pt return to room from ct.

## 2021-04-02 NOTE — ED Provider Notes (Signed)
[facility] Emergency Department Provider Note  ____________________________________________   Event Date/Time   First MD Initiated Contact with Patient 04/02/21 1958     (approximate)  I have reviewed the triage vital signs and the nursing notes.   HISTORY  Chief Complaint Code Stroke    HPI Cassandra Flynn is a 25 y.o. female with last known normal of 04/02/2021 at 6:50 PM who comes in with aphasia.  Patient was sitting down when she developed nausea, vomiting, headaches, slurred speech that was constant, severe, nothing made it better, nothing made it worse.  EMS noted the slurred speech but no other neurodeficits.  When she was transported to the ER her speech is getting better but still has some aphasia noted.  Patient's sugars were elevated in the 400s.  Blood pressures were normal.  Later pt reported that she actually had dizzyness and headaches as well earlier in the day that started on 200PM on 04/02/2021.     Past Medical History:  Diagnosis Date  . Asthma   . Diabetes mellitus without complication (HCC)   . Hypertension   . Sleep apnea     Patient Active Problem List   Diagnosis Date Noted  . Uncontrolled diabetes mellitus (HCC) 06/22/2019  . Diabetic ketoacidosis (HCC) 06/22/2019  . 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 Conversion disorder 06/22/2019  . Morbid obesity (HCC) 03/24/2017  . Post traumatic stress disorder (PTSD) 06/16/2014  . Bipolar I disorder, most recent episode (or current) mixed, severe, specified as with psychotic behavior 06/16/2014  . ODD (oppositional defiant disorder) 06/16/2014    Past Surgical History:  Procedure Laterality Date  . CESAREAN SECTION    . CHOLECYSTECTOMY    . SKIN GRAFT    . TONSILLECTOMY      Prior to Admission medications   Medication Sig Start Date End Date Taking? Authorizing Provider  albuterol (PROVENTIL HFA;VENTOLIN HFA) 108 (90 Base) MCG/ACT inhaler Inhale 2 puffs into the lungs every 6  (six) hours as needed for wheezing or shortness of breath. 08/11/17   Willy Eddy, MD  alum & mag hydroxide-simeth (MAALOX/MYLANTA) 200-200-20 MG/5ML suspension Take 30 mLs by mouth every 4 (four) hours as needed for indigestion or heartburn. 05/05/19   Minna Antis, MD  dicyclomine (BENTYL) 10 MG capsule Take 1 capsule (10 mg total) by mouth 3 (three) times daily as needed for up to 5 days for spasms. 03/08/18 03/13/18  Dionne Bucy, MD  glipiZIDE (GLUCOTROL) 5 MG tablet Take 1 tablet (5 mg total) by mouth 2 (two) times daily. 03/30/19 03/29/20  Emily Filbert, MD  glipiZIDE (GLUCOTROL) 5 MG tablet Take 1 tablet (5 mg total) by mouth daily. 03/30/19 03/29/20  Emily Filbert, MD  HYDROcodone-acetaminophen (NORCO) 5-325 MG tablet Take 1 tablet by mouth every 6 (six) hours as needed for moderate pain. Patient not taking: Reported on 11/11/2018 08/11/17   Irean Hong, MD  ibuprofen (ADVIL,MOTRIN) 800 MG tablet Take 1 tablet (800 mg total) by mouth every 8 (eight) hours as needed for moderate pain. Patient not taking: Reported on 11/11/2018 08/11/17   Irean Hong, MD  metFORMIN (GLUCOPHAGE) 500 MG tablet Take 2 tablets (1,000 mg total) by mouth 2 (two) times daily with a meal. 03/24/17   Linzie Collin, MD  norelgestromin-ethinyl estradiol (ORTHO EVRA) 150-35 MCG/24HR transdermal patch Place 1 patch onto the skin once a week. Reported on 02/14/2016    [provider]  OLANZapine (ZYPREXA) 5 MG tablet Take 1 tablet (5 mg  total) by mouth at bedtime. Patient not taking: Reported on 11/11/2018 08/26/17   Willy Eddy, MD  ondansetron (ZOFRAN ODT) 8 MG disintegrating tablet Take 1 tablet (8 mg total) by mouth every 8 (eight) hours as needed for nausea or vomiting. Patient not taking: Reported on 02/14/2016 08/29/15   Sharman Cheek, MD  oxyCODONE-acetaminophen (PERCOCET) 5-325 MG tablet Take 1-2 tablets by mouth every 6 (six) hours as needed. Patient not taking: Reported on  11/11/2018 08/12/17   Emily Filbert, MD  ranitidine (ZANTAC) 150 MG capsule Take 1 capsule (150 mg total) by mouth 2 (two) times daily. Patient not taking: Reported on 02/14/2016 08/29/15   Sharman Cheek, MD  traZODone (DESYREL) 25 mg TABS tablet Take 0.5 tablets (25 mg total) by mouth at bedtime as needed for sleep. Patient not taking: Reported on 02/14/2016 06/23/14   Kendrick Fries, NP    Allergies Penicillins  No family history on file.  Social History Social History   Tobacco Use  . Smoking status: Never Smoker  . Smokeless tobacco: Never Used  Substance Use Topics  . Alcohol use: No  . Drug use: No      Review of Systems Constitutional: No fever/chills Eyes: Positive vision changes ENT: No sore throat. Cardiovascular: Denies chest pain. Respiratory: Denies shortness of breath. Gastrointestinal: No abdominal pain.  No nausea, no vomiting.  No diarrhea.  No constipation. Genitourinary: Negative for dysuria. Musculoskeletal: Negative for back pain. Skin: Negative for rash. Neurological:  Positive aphasia, dizzy, headache All other ROS negative ____________________________________________   PHYSICAL EXAM:  VITAL SIGNS: ED Triage Vitals  Enc Vitals Group     BP      Pulse      Resp      Temp      Temp src      SpO2      Weight      Height      Head Circumference      Peak Flow      Pain Score      Pain Loc      Pain Edu?      Excl. in GC?     Constitutional: Altered, elevated BMI Eyes: Conjunctivae are normal. EOMI. Head: Atraumatic. Nose: No congestion/rhinnorhea. Mouth/Throat: Mucous membranes are moist.   Neck: No stridor. Trachea Midline. FROM Cardiovascular: Normal rate, regular rhythm. Grossly normal heart sounds.  Good peripheral circulation. Respiratory: Normal respiratory effort.  No retractions. Lungs CTAB. Gastrointestinal: Soft and nontender. No distention. No abdominal bruits.  Musculoskeletal: No lower extremity tenderness nor  edema.  No joint effusions. Neurologic: Speech is slow,  vision changes. Is able to move all extremities NIHSS 2 Skin:  Skin is warm, dry and intact. No rash noted. Psychiatric: Mood and affect are normal.  GU: Deferred   ____________________________________________   LABS (all labs ordered are listed, but only abnormal results are displayed)  Labs Reviewed  RESP PANEL BY RT-PCR (FLU A&B, COVID) ARPGX2  ETHANOL  PROTIME-INR  APTT  CBC  DIFFERENTIAL  COMPREHENSIVE METABOLIC PANEL  URINE DRUG SCREEN, QUALITATIVE ([facility] ONLY)  URINALYSIS, ROUTINE W REFLEX MICROSCOPIC  HCG, QUANTITATIVE, PREGNANCY  POC URINE PREG, ED   ____________________________________________   ED ECG REPORT I, Concha Se, the attending physician, personally viewed and interpreted this ECG.  Sinus tachycardia rate of 112, no ST elevation, T wave version lead III, aVF, normal intervals ____________________________________________  RADIOLOGY Vela Prose, personally viewed and evaluated these images (plain radiographs) as part of my medical  decision making, as well as reviewing the written report by the radiologist.  ED MD interpretation: No intracranial hemorrhage  Official radiology report(s): CT HEAD CODE STROKE WO CONTRAST  Result Date: 04/02/2021 CLINICAL DATA:  Code stroke.  Slurred speech and headache EXAM: CT HEAD WITHOUT CONTRAST TECHNIQUE: Contiguous axial images were obtained from the base of the skull through the vertex without intravenous contrast. COMPARISON:  None. FINDINGS: Brain: There is no mass, hemorrhage or extra-axial collection. The size and configuration of the ventricles and extra-axial CSF spaces are normal. The brain parenchyma is normal, without evidence of acute or chronic infarction. Vascular: No abnormal hyperdensity of the major intracranial arteries or dural venous sinuses. No intracranial atherosclerosis. Skull: The visualized skull base, calvarium and extracranial soft  tissues are normal. Sinuses/Orbits: No fluid levels or advanced mucosal thickening of the visualized paranasal sinuses. No mastoid or middle ear effusion. The orbits are normal. ASPECTS [facility] Stroke Program Early CT Score) - Ganglionic level infarction (caudate, lentiform nuclei, internal capsule, insula, M1-M3 cortex): 7 - Supraganglionic infarction (M4-M6 cortex): 3 Total score (0-10 with 10 being normal): 10 IMPRESSION: 1. Normal head CT. 2. ASPECTS is 10. These results were called by telephone at the time of interpretation on 04/02/2021 at 8:24 pm to provider [facility] , who verbally acknowledged these results. Electronically Signed   By: Deatra Robinson M.D.   On: 04/02/2021 20:24    ____________________________________________   PROCEDURES  Procedure(s) performed (including Critical Care):  Ultrasound ED Peripheral IV (Provider)  Date/Time: 04/02/2021 8:59 PM Performed by: Concha Se, MD Authorized by: Concha Se, MD   Procedure details:    Indications: hydration     Skin Prep: chlorhexidine gluconate     Location:  Right AC   Angiocath:  20 G   Bedside Ultrasound Guided: Yes     Images: not archived     Patient tolerated procedure without complications: Yes     Dressing applied: Yes    .1-3 Lead EKG Interpretation Performed by: Concha Se, MD Authorized by: Concha Se, MD     Interpretation: abnormal     ECG rate:  100s    ECG rate assessment: tachycardic     Rhythm: sinus tachycardia     Ectopy: none     Conduction: normal       ____________________________________________   INITIAL IMPRESSION / ASSESSMENT AND PLAN / ED COURSE  Connie Lasater was evaluated in Emergency Department on 04/02/2021 for the symptoms described in the history of present illness. She was evaluated in the context of the global COVID-19 pandemic, which necessitated consideration that the patient might be at risk for infection with the SARS-CoV-2 virus that causes COVID-19.  Institutional protocols and algorithms that pertain to the evaluation of patients at risk for COVID-19 are in a state of rapid change based on information released by regulatory bodies including the CDC and federal and state organizations. These policies and algorithms were followed during the patient's care in the ED.     Patient is a 25 year old who comes in with code stroke for aphasia but seems more like slow speec and altered mental status.  Telemetry neurologist was consulted for code stroke. Not in TPA window given symptoms first start 200PM.  CT head ordered to evaluate for intercranial hemorrhage.  CT dissection ordered to evaluate for dissection, basilar clot.  Discussed with radiology to also look for venous thrombus which they stated they can do a delayed sequence on the CTA.  Patient also hyperglycemic so labs ordered to evaluate for DKA.  Will give 1 L of fluid.  Patient's exam is not consistent and there could be a functional component to this if work-up is otherwise negative.  When we did a COVID swab patient moves all 4 extremities well.  Also her  Slowed speech seems to be coming and going.  CT head was negative  Discussed with the neurology team  Who recommended CTA and admission to the hospital for MRI and further workup.        ____________________________________________   FINAL CLINICAL IMPRESSION(S) / ED DIAGNOSES   Final diagnoses:  Aphasia  COVID-19      MEDICATIONS GIVEN DURING THIS VISIT:  Medications  albuterol (VENTOLIN HFA) 108 (90 Base) MCG/ACT inhaler 2 puff (has no administration in time range)  acetaminophen (TYLENOL) tablet 325 mg (325 mg Oral Given 04/03/21 0021)  ondansetron (ZOFRAN) tablet 4 mg (4 mg Oral Given 04/03/21 0020)    Or  ondansetron (ZOFRAN) injection 4 mg ( Intravenous See Alternative 04/03/21 0020)  enoxaparin (LOVENOX) injection 77.5 mg (77.5 mg Subcutaneous Given 04/03/21 0021)  guaiFENesin-dextromethorphan (ROBITUSSIN DM) 100-10  MG/5ML syrup 10 mL (has no administration in time range)  chlorpheniramine-HYDROcodone (TUSSIONEX) 10-8 MG/5ML suspension 5 mL (has no administration in time range)  labetalol (NORMODYNE) injection 5 mg (has no administration in time range)  insulin aspart (novoLOG) injection 0-15 Units (8 Units Subcutaneous Given 04/03/21 0943)  insulin aspart (novoLOG) injection 0-5 Units (3 Units Subcutaneous Given 04/03/21 0101)  remdesivir 100 mg in sodium chloride 0.9 % 100 mL IVPB (100 mg Intravenous New Bag/Given 04/03/21 0239)    Followed by  remdesivir 100 mg in sodium chloride 0.9 % 100 mL IVPB (100 mg Intravenous Not Given 04/03/21 1022)    Followed by  remdesivir 100 mg in sodium chloride 0.9 % 100 mL IVPB (has no administration in time range)   stroke: mapping our early stages of recovery book ( Does not apply Canceled Entry 04/03/21 0239)  azithromycin (ZITHROMAX) 500 mg in sodium chloride 0.9 % 250 mL IVPB (0 mg Intravenous Hold 04/03/21 0145)  sodium chloride 0.9 % bolus 1,000 mL (0 mLs Intravenous Stopped 04/03/21 0004)  iohexol (OMNIPAQUE) 350 MG/ML injection 75 mL (75 mLs Intravenous Contrast Given 04/02/21 2118)  iohexol (OMNIPAQUE) 350 MG/ML injection 100 mL (100 mLs Intravenous Contrast Given 04/02/21 2302)     ED Discharge Orders    None       Note:  This document was prepared using Dragon voice recognition software and may include unintentional dictation errors.   Concha SeFunke, Elyana Grabski E, MD 04/03/21 1102

## 2021-04-02 NOTE — H&P (Signed)
History and Physical   Cassandra Flynn MVH:[id number] DOB: 1995-12-13 DOA: 04/02/2021  PCP: [facility]  Patient coming from: Home  I have personally briefly reviewed patient's old medical records in [facility] EMR.  Chief Concern: slurred speech  HPI: Cassandra Flynn is a 25 y.o. female with medical history significant for morbid obesity, non-insulin-dependent diabetes mellitus, asthma, presents to the emergency department for chief concerns of slurred speech.  She states that the slurred speech that started at 2 pm on day of presentation.  She also endorses headache, nausea, shortness of breath. She endorses chest discomfort with cough.  She denies abdominal pain, dysuria, hematuria. She endorses diarrhea, with 2 episodes of watery stool on day of presentation.  She states that the watery stool is different from her normal bowel movements.  She states that despite taking metformin she does not have watery or diarrhea or loose stools.  Social history: lives with her grand mother, and her 2 daughters. She denies tobacco, EtOH.  She endorses infrequent marijuana use.  She denies other recreational drug use.  She works at [employer].  Vaccination: Patient is not vaccinated for COVID-19 and expresses that she would now like to be vaccinated  ROS: Constitutional: no weight change, no fever ENT/Mouth: no sore throat, no rhinorrhea Eyes: no eye pain, no vision changes Cardiovascular: no chest pain, no dyspnea,  no edema, no palpitations Respiratory: + cough, no sputum, no wheezing Gastrointestinal: + nausea, no vomiting, no diarrhea, no constipation Genitourinary: no urinary incontinence, no dysuria, no hematuria Musculoskeletal: no arthralgias, no myalgias Skin: no skin lesions, no pruritus, Neuro: + weakness, no loss of consciousness, no syncope Psych: no anxiety, no depression, + decrease appetite Heme/Lymph: no bruising, no bleeding  ED Course: Discussed  with ED provider, patient requiring hospitalization for stroke work-up and now found to be COVID-positive.  Vitals in the emergency department show temperature of 98.1, respiration initially was 28, heart rate 114, blood pressure 159/116, SPO2 at 97% on room air.  Labs in the emergency department was remarkable for sodium level 134, nonfasting blood glucose 369, WBC 12.6, neutrophil count elevated at 806, COVID was positive.  Urine pregnancy test was negative. UA was negative for leukocytes and nitrates and positive for ketones.  Patient is status post normal saline 1 L bolus per EDP.  Assessment/Plan  Principal Problem:   CVA (cerebral vascular accident) [facility]) Active Problems:   Morbid obesity (HCC)   Uncontrolled diabetes mellitus (HCC)   Conversion disorder   COVID-19 virus infection   Gastroenteritis due to COVID-19 virus   Stroke-like symptoms - CT head without contrast read as: Normal head CT - telemetry neurology has been consulted and we appreciate further recommendations - If MRI is positive, would recommend complete echo - MRI of the brain ordered - Fasting lipid and A1c ordered in the a.m. - Permissive hypertension  - Frequent neuro vascular checks - PT, OT, SLP - Labetalol 5 mg every 2 hours as needed for SBP greater than 180 - Fall precaution and aspiration precaution  Diarrhea, suspect gastroenteritis secondary to COVID-19 infection - IV remdesivir per pharmacy, - Patient is not requiring SPO2 supplementation at this time, therefore steroids has not been initiated  - Superimposed bacterial infection has not been excluded, will monitor CTA of the chest for superimposed pna - Incentive spirometry and flutter valve for 10 reps every 2 hours while awake - Albuterol inhaler 2 puffs every 4 hours while awake - Daily labs: CMP, CBC, CRP -  Supplemental oxygen to maintain SPO2 goal of greater than 88% - Airborne and contact precautions  Noninsulin-dependent diabetes  mellitus-metformin 1000 mg twice daily held during this hospitalization due to multiple contrast use for imaging - Insulin sliding scale, with at bedtime coverage ordered  Sinus tachycardia with elevated respiration rate in a patient with COVID-19- pulmonary embolism cannot be excluded - CTA of the chest to assess for pulmonary embolism has been ordered  Morbid obesity- counseled patient on daily walking, for 30 minutes in a safe environment, healthy eating, lean protein, colorful vegetables  Vaccination- patient is not a candidate for vaccination at this time as patient is currently acutely ill  Patient with elevated MEWs - patient is otherwise appropriate for med-surg floor, however nursing staff declined due to MEWS being a 3, bed placement changed to PCU  Chart reviewed.   DVT prophylaxis: Enoxaparin subcutaneous every 24 hours, TED hose Code Status: Full code Diet: Heart healthy/carb modified Family Communication: No Disposition Plan: Pending clinical course Consults called: Telemetry neurology via EDP Admission status: Observation, PCU, with telemetry, 24 hours ordered  Past Medical History:  Diagnosis Date  . Asthma   . Diabetes mellitus without complication (HCC)   . Hypertension   . Sleep apnea    Past Surgical History:  Procedure Laterality Date  . CESAREAN SECTION    . CHOLECYSTECTOMY    . SKIN GRAFT    . TONSILLECTOMY     Social History:  reports that she has never smoked. She has never used smokeless tobacco. She reports that she does not drink alcohol and does not use drugs.  Allergies  Allergen Reactions  . Penicillins Itching, Swelling and Rash    Has patient had a PCN reaction causing immediate rash, facial/tongue/throat swelling, SOB or lightheadedness with hypotension: Unknown Has patient had a PCN reaction causing severe rash involving mucus membranes or skin necrosis: Unknown Has patient had a PCN reaction that required hospitalization: Unknown Has  patient had a PCN reaction occurring within the last 10 years: Unknown If all of the above answers are "NO", then may proceed with Cephalosporin use.    No family history on file. Family history: Family history reviewed and not pertinent  Prior to Admission medications   Medication Sig Start Date End Date Taking? Authorizing Provider  albuterol (PROVENTIL HFA;VENTOLIN HFA) 108 (90 Base) MCG/ACT inhaler Inhale 2 puffs into the lungs every 6 (six) hours as needed for wheezing or shortness of breath. 08/11/17   Willy Eddy, MD  alum & mag hydroxide-simeth (MAALOX/MYLANTA) 200-200-20 MG/5ML suspension Take 30 mLs by mouth every 4 (four) hours as needed for indigestion or heartburn. 05/05/19   Minna Antis, MD  dicyclomine (BENTYL) 10 MG capsule Take 1 capsule (10 mg total) by mouth 3 (three) times daily as needed for up to 5 days for spasms. 03/08/18 03/13/18  Dionne Bucy, MD  glipiZIDE (GLUCOTROL) 5 MG tablet Take 1 tablet (5 mg total) by mouth 2 (two) times daily. 03/30/19 03/29/20  Emily Filbert, MD  glipiZIDE (GLUCOTROL) 5 MG tablet Take 1 tablet (5 mg total) by mouth daily. 03/30/19 03/29/20  Emily Filbert, MD  HYDROcodone-acetaminophen (NORCO) 5-325 MG tablet Take 1 tablet by mouth every 6 (six) hours as needed for moderate pain. Patient not taking: Reported on 11/11/2018 08/11/17   Irean Hong, MD  ibuprofen (ADVIL,MOTRIN) 800 MG tablet Take 1 tablet (800 mg total) by mouth every 8 (eight) hours as needed for moderate pain. Patient not taking: Reported on 11/11/2018 08/11/17  Irean HongSung, Jade J, MD  metFORMIN (GLUCOPHAGE) 500 MG tablet Take 2 tablets (1,000 mg total) by mouth 2 (two) times daily with a meal. 03/24/17   Linzie CollinEvans, David James, MD  norelgestromin-ethinyl estradiol (ORTHO EVRA) 150-35 MCG/24HR transdermal patch Place 1 patch onto the skin once a week. Reported on 02/14/2016    [provider]  OLANZapine (ZYPREXA) 5 MG tablet Take 1 tablet (5 mg total) by mouth at  bedtime. Patient not taking: Reported on 11/11/2018 08/26/17   Willy Eddyobinson, Patrick, MD  ondansetron (ZOFRAN ODT) 8 MG disintegrating tablet Take 1 tablet (8 mg total) by mouth every 8 (eight) hours as needed for nausea or vomiting. Patient not taking: Reported on 02/14/2016 08/29/15   Sharman CheekStafford, Phillip, MD  oxyCODONE-acetaminophen (PERCOCET) 5-325 MG tablet Take 1-2 tablets by mouth every 6 (six) hours as needed. Patient not taking: Reported on 11/11/2018 08/12/17   Emily FilbertWilliams, Jonathan E, MD  ranitidine (ZANTAC) 150 MG capsule Take 1 capsule (150 mg total) by mouth 2 (two) times daily. Patient not taking: Reported on 02/14/2016 08/29/15   Sharman CheekStafford, Phillip, MD  traZODone (DESYREL) 25 mg TABS tablet Take 0.5 tablets (25 mg total) by mouth at bedtime as needed for sleep. Patient not taking: Reported on 02/14/2016 06/23/14   Kendrick FriesBlankmann, Meghan, NP   Physical Exam: Vitals:   04/02/21 2215 04/02/21 2230 04/02/21 2245 04/02/21 2300  BP:  (!) 162/102    Pulse: (!) 104 (!) 114  (!) 119  Resp: (!) 21 (!) 21    Temp:      TempSrc:      SpO2: 96% 98% 98% 97%  Weight:      Height:       Constitutional: appears older than chronological age, NAD, calm, comfortable Eyes: PERRL, lids and conjunctivae normal ENMT: Mucous membranes are moist. Posterior pharynx clear of any exudate or lesions. Age-appropriate dentition. Hearing appropriate Neck: normal, supple, no masses, no thyromegaly Respiratory: clear to auscultation bilaterally.  Mild wheezing.  No crackles. Normal respiratory effort. No accessory muscle use.  Cardiovascular: Regular rate and rhythm, no murmurs / rubs / gallops. No extremity edema. 2+ pedal pulses. No carotid bruits.  Abdomen: Morbid obesity, no tenderness, no masses palpated, no hepatosplenomegaly. Bowel sounds positive.  Musculoskeletal: no clubbing / cyanosis. No joint deformity upper and lower extremities. Good ROM, no contractures, no atrophy. Normal muscle tone.  Skin: no rashes, lesions,  ulcers. No induration Neurologic: Sensation intact. Strength 5/5 in all 4.  Psychiatric: Normal judgment and insight. Alert and oriented x 3. Normal mood.   EKG: independently reviewed, showing sinus tachycardia with rate of 112, QTc 419  Chest x-ray on Admission: I personally reviewed and I agree with radiologist reading as below.  CT HEAD CODE STROKE WO CONTRAST  Result Date: 04/02/2021 CLINICAL DATA:  Code stroke.  Slurred speech and headache EXAM: CT HEAD WITHOUT CONTRAST TECHNIQUE: Contiguous axial images were obtained from the base of the skull through the vertex without intravenous contrast. COMPARISON:  None. FINDINGS: Brain: There is no mass, hemorrhage or extra-axial collection. The size and configuration of the ventricles and extra-axial CSF spaces are normal. The brain parenchyma is normal, without evidence of acute or chronic infarction. Vascular: No abnormal hyperdensity of the major intracranial arteries or dural venous sinuses. No intracranial atherosclerosis. Skull: The visualized skull base, calvarium and extracranial soft tissues are normal. Sinuses/Orbits: No fluid levels or advanced mucosal thickening of the visualized paranasal sinuses. No mastoid or middle ear effusion. The orbits are normal. ASPECTS [facility]([place] Stroke  Program Early CT Score) - Ganglionic level infarction (caudate, lentiform nuclei, internal capsule, insula, M1-M3 cortex): 7 - Supraganglionic infarction (M4-M6 cortex): 3 Total score (0-10 with 10 being normal): 10 IMPRESSION: 1. Normal head CT. 2. ASPECTS is 10. These results were called by telephone at the time of interpretation on 04/02/2021 at 8:24 pm to provider [facility] , who verbally acknowledged these results. Electronically Signed   By: Deatra Robinson M.D.   On: 04/02/2021 20:24   CT ANGIO HEAD CODE STROKE  Result Date: 04/02/2021 CLINICAL DATA:  Word-finding difficulty EXAM: CT ANGIOGRAPHY HEAD AND NECK TECHNIQUE: Multidetector CT imaging of the head and  neck was performed using the standard protocol during bolus administration of intravenous contrast. Multiplanar CT image reconstructions and MIPs were obtained to evaluate the vascular anatomy. Carotid stenosis measurements (when applicable) are obtained utilizing NASCET criteria, using the distal internal carotid diameter as the denominator. CONTRAST:  55mL OMNIPAQUE IOHEXOL 350 MG/ML SOLN COMPARISON:  None. FINDINGS: Quality of the study is degraded by photon starvation artifacts secondary to patient body habitus. CTA NECK FINDINGS SKELETON: There is no bony spinal canal stenosis. No lytic or blastic lesion. OTHER NECK: Normal pharynx, larynx and major salivary glands. No cervical lymphadenopathy. Unremarkable thyroid gland. UPPER CHEST: Anterior right middle lobe atelectasis. AORTIC ARCH: There is no calcific atherosclerosis of the aortic arch. There is no aneurysm, dissection or hemodynamically significant stenosis of the visualized portion of the aorta. Conventional 3 vessel aortic branching pattern. The visualized proximal subclavian arteries are widely patent. RIGHT CAROTID SYSTEM: Normal without aneurysm, dissection or stenosis. LEFT CAROTID SYSTEM: Normal without aneurysm, dissection or stenosis. VERTEBRAL ARTERIES: Left dominant configuration. Both origins are clearly patent. There is no dissection, occlusion or flow-limiting stenosis to the skull base (V1-V3 segments). CTA HEAD FINDINGS POSTERIOR CIRCULATION: --Vertebral arteries: Normal V4 segments. --Inferior cerebellar arteries: Normal. --Basilar artery: Normal. --Superior cerebellar arteries: Normal. --Posterior cerebral arteries (PCA): Normal. ANTERIOR CIRCULATION: --Intracranial internal carotid arteries: Normal. --Anterior cerebral arteries (ACA): Normal. Both A1 segments are present. Patent anterior communicating artery (a-comm). --Middle cerebral arteries (MCA): Normal. VENOUS SINUSES: As permitted by contrast timing, patent. ANATOMIC VARIANTS:  None Review of the MIP images confirms the above findings. IMPRESSION: 1. Examination degraded by photon starvation artifacts secondary to patient body habitus. Within that limitation, no emergent large vessel occlusion or high-grade stenosis of the intracranial arteries. 2. Right middle lobe atelectasis. Electronically Signed   By: Deatra Robinson M.D.   On: 04/02/2021 21:55   CT ANGIO NECK CODE STROKE  Result Date: 04/02/2021 CLINICAL DATA:  Word-finding difficulty EXAM: CT ANGIOGRAPHY HEAD AND NECK TECHNIQUE: Multidetector CT imaging of the head and neck was performed using the standard protocol during bolus administration of intravenous contrast. Multiplanar CT image reconstructions and MIPs were obtained to evaluate the vascular anatomy. Carotid stenosis measurements (when applicable) are obtained utilizing NASCET criteria, using the distal internal carotid diameter as the denominator. CONTRAST:  47mL OMNIPAQUE IOHEXOL 350 MG/ML SOLN COMPARISON:  None. FINDINGS: Quality of the study is degraded by photon starvation artifacts secondary to patient body habitus. CTA NECK FINDINGS SKELETON: There is no bony spinal canal stenosis. No lytic or blastic lesion. OTHER NECK: Normal pharynx, larynx and major salivary glands. No cervical lymphadenopathy. Unremarkable thyroid gland. UPPER CHEST: Anterior right middle lobe atelectasis. AORTIC ARCH: There is no calcific atherosclerosis of the aortic arch. There is no aneurysm, dissection or hemodynamically significant stenosis of the visualized portion of the aorta. Conventional 3 vessel aortic branching pattern. The visualized  proximal subclavian arteries are widely patent. RIGHT CAROTID SYSTEM: Normal without aneurysm, dissection or stenosis. LEFT CAROTID SYSTEM: Normal without aneurysm, dissection or stenosis. VERTEBRAL ARTERIES: Left dominant configuration. Both origins are clearly patent. There is no dissection, occlusion or flow-limiting stenosis to the skull base  (V1-V3 segments). CTA HEAD FINDINGS POSTERIOR CIRCULATION: --Vertebral arteries: Normal V4 segments. --Inferior cerebellar arteries: Normal. --Basilar artery: Normal. --Superior cerebellar arteries: Normal. --Posterior cerebral arteries (PCA): Normal. ANTERIOR CIRCULATION: --Intracranial internal carotid arteries: Normal. --Anterior cerebral arteries (ACA): Normal. Both A1 segments are present. Patent anterior communicating artery (a-comm). --Middle cerebral arteries (MCA): Normal. VENOUS SINUSES: As permitted by contrast timing, patent. ANATOMIC VARIANTS: None Review of the MIP images confirms the above findings. IMPRESSION: 1. Examination degraded by photon starvation artifacts secondary to patient body habitus. Within that limitation, no emergent large vessel occlusion or high-grade stenosis of the intracranial arteries. 2. Right middle lobe atelectasis. Electronically Signed   By: Deatra Robinson M.D.   On: 04/02/2021 21:55   Labs on Admission: I have personally reviewed following labs  CBC: Recent Labs  Lab 04/02/21 2009  WBC 12.6*  NEUTROABS 8.6*  HGB 13.9  HCT 40.6  MCV 84.6  PLT 278   Basic Metabolic Panel: Recent Labs  Lab 04/02/21 2009  NA 134*  K 3.6  CL 98  CO2 22  GLUCOSE 369*  BUN 7  CREATININE 0.68  CALCIUM 9.3   GFR: Estimated Creatinine Clearance: 180.7 mL/min (by C-G formula based on SCr of 0.68 mg/dL).  Liver Function Tests: Recent Labs  Lab 04/02/21 2009  AST 26  ALT 32  ALKPHOS 88  BILITOT 0.9  PROT 8.1  ALBUMIN 3.9   Coagulation Profile: Recent Labs  Lab 04/02/21 2009  INR 1.0   Urine analysis:    Component Value Date/Time   COLORURINE YELLOW (A) 04/02/2021 2147   APPEARANCEUR CLEAR (A) 04/02/2021 2147   APPEARANCEUR Clear 06/15/2014 0625   LABSPEC 1.043 (H) 04/02/2021 2147   LABSPEC 1.011 06/15/2014 0625   PHURINE 6.0 04/02/2021 2147   GLUCOSEU >=500 (A) 04/02/2021 2147   GLUCOSEU Negative 06/15/2014 0625   HGBUR SMALL (A) 04/02/2021  2147   BILIRUBINUR NEGATIVE 04/02/2021 2147   BILIRUBINUR Negative 06/15/2014 0625   KETONESUR 5 (A) 04/02/2021 2147   PROTEINUR NEGATIVE 04/02/2021 2147   NITRITE NEGATIVE 04/02/2021 2147   LEUKOCYTESUR NEGATIVE 04/02/2021 2147   LEUKOCYTESUR Negative 06/15/2014 0625   Nyleah Mcginnis N Haset [place] D.O. [facility]  If 7PM-7AM, please contact overnight-coverage provider If 7AM-7PM, please contact day coverage provider [email or web address]  04/02/2021, 11:13 PM

## 2021-04-02 NOTE — ED Notes (Addendum)
This RN in CT with patient, CT advising RN that EMS IV catheter not fully inserted and contrast cannot be given. IV no longer given blood return, this RN attempted x2 and CT staff attempted x1 for IV insertion without success. MD Fuller Plan made aware and pt to return to room.

## 2021-04-02 NOTE — ED Notes (Signed)
IP RN Angelica still concerned for pt's elevated MEWS score. MD Cox made aware.

## 2021-04-02 NOTE — Consult Note (Signed)
[facility] Tele[facility]urology Consult Services   Date of Service:   04/02/2021 20:06:00  Diagnosis:     .  R47.81 - Slurred speech  Impression:     . 26 year old female with a past medical history of DM not complaint with medications presents with symptoms of slurred speech and vertigo. Recommend admission for stroke work up. Recommend CTA head and neck and CTV to rule out venous sinus thrombosis.   Metrics: Last Known Well: 04/02/2021 14:00:00 [facility] Notification Time: 04/02/2021 20:06:00 Arrival Time: 04/02/2021 19:56:00 Stamp Time: 04/02/2021 20:06:00 Initial Response Time: 04/02/2021 20:10:48 Symptoms: Dizziness and slurred speech. Marland Kitchen NIHSS Start Assessment Time: 04/02/2021 20:12:51 Patient is not a candidate for Thrombolytic. Thrombolytic Medical Decision: 04/02/2021 20:36:31 Patient was not deemed candidate for Thrombolytic because of following reasons: Last Well Known Above 4.5 Hours.  CT head showed no acute hemorrhage or acute core infarct.  ED Physician notified of diagnostic impression and management plan on 04/02/2021 20:58:13  Advanced Imaging: CTA Head and Neck Completed.  LVO:No  Patient doesn't meet criteria for emergent NIR consideration   Our recommendations are outlined below.  Recommendations:      .  Stroke/Telemetry Floor     .  Neuro Checks     .  Bedside Swallow Eval     .  DVT Prophylaxis     .  IV Fluids, Normal Saline     .  Head of Bed 30 Degrees     .  Euglycemia and Avoid Hyperthermia (PRN Acetaminophen)     .  Initiate Aspirin 81 MG Daily    Sign Out:     .  Discussed with Emergency Department Provider    ------------------------------------------------------------------------------  History of Present Illness: Patient is a 25 year old Female.  Patient was brought by private transportation with symptoms of Dizziness and slurred speech.   25 year old female with a past medical history of DM not  complaint with medications presents with symptoms of slurred speech and vertigo. Last known well time was 2 PM. Patient started to have vertigo after 2 PM and developed slurred speech while she was at work. Patient has slow speech and right sided complete hemianopsia. Patient also states that she is complaining of a severe headache, 10/10. No history of migraines or headaches.  Past Medical History:     . Diabetes Mellitus  Anticoagulant use:  No  Antiplatelet use: No  Allergies:  Reviewed     Examination: BP(159/100), Pulse(120), Blood Glucose(417) 1A: Level of Consciousness - Alert; keenly responsive + 0 1B: Ask Month and Age - Both Questions Right + 0 1C: Blink Eyes & Squeeze Hands - Performs Both Tasks + 0 2: Test Horizontal Extraocular Movements - Normal + 0 3: Test Visual Fields - Complete Hemianopia + 2 4: Test Facial Palsy (Use Grimace if Obtunded) - Normal symmetry + 0 5A: Test Left Arm Motor Drift - No Drift for 10 Seconds + 0 5B: Test Right Arm Motor Drift - No Drift for 10 Seconds + 0 6A: Test Left Leg Motor Drift - No Drift for 5 Seconds + 0 6B: Test Right Leg Motor Drift - No Drift for 5 Seconds + 0 7: Test Limb Ataxia (FNF/Heel-Shin) - No Ataxia + 0 8: Test Sensation - Normal; No sensory loss + 0 9: Test Language/Aphasia - Normal; No aphasia + 0 10: Test Dysarthria - Normal + 0 11: Test Extinction/Inattention - No abnormality + 0  NIHSS Score: 2   Pre-Morbid Modified  Rankin Scale: 0 Points = No symptoms at all   Patient/Family was informed the Neurology Consult would occur via TeleHealth consult by way of interactive audio and video telecommunications and consented to receiving care in this manner.   Patient is being evaluated for possible acute neurologic impairment and high probability of imminent or life-threatening deterioration. I spent total of 30 minutes providing care to this patient, including time for face to face visit via telemedicine, review of  medical records, imaging studies and discussion of findings with providers, the patient and/or family.   Dr Lucious Groves   [facility] [phone]  Case [id number]

## 2021-04-02 NOTE — ED Triage Notes (Signed)
Per ems pt code stroke. Ems states last known normal 1850. Pt arrives via ems with clear speech but having difficulty finding words, MD at side. Pt to ct from ems strecher. 20g right ac. Per ems was dizzy earlier today, ems states on their arrival pt with weakness and slurred speech. Pt complains of headache now. fsbs per ems 417, vitals:hr 126, 154/94, 97% ra. Per ems pt with bilateral arm drift, right arm weakness, no facial drooping per ems.

## 2021-04-02 NOTE — ED Notes (Signed)
Pt ambulatory to restroom independently with a steady gait.

## 2021-04-02 NOTE — Progress Notes (Signed)
PHARMACIST - PHYSICIAN COMMUNICATION  CONCERNING:  Enoxaparin (Lovenox) for DVT Prophylaxis    RECOMMENDATION: Patient was prescribed enoxaprin 40mg  q24 hours for VTE prophylaxis.   Filed Weights   04/02/21 2121  Weight: (!) 153.4 kg (338 lb 1.6 oz)    Body mass index is 47.16 kg/m.  Estimated Creatinine Clearance: 180.7 mL/min (by C-G formula based on SCr of 0.68 mg/dL).   Based on [facility] policy patient is candidate for enoxaparin 0.5mg /kg TBW SQ every 24 hours based on BMI being >30.  DESCRIPTION: Pharmacy has adjusted enoxaparin dose per [facility] policy.  Patient is now receiving enoxaparin 0.5 mg/kg every 24 hours   [facility], PharmD, [facility] 04/02/2021 10:43 PM

## 2021-04-02 NOTE — Progress Notes (Signed)
Remdesivir - Pharmacy Brief Note   O:  CT: "Patchy perihilar and basilar airspace disease in the lungs could represent edema or multifocal pneumonia." SpO2: 96-99% on RA.   A/P:  Remdesivir 200 mg (100mg  x 2 doses) once followed by 100 mg IVPB daily x 4 days.   , PharmD, [facility] 04/02/2021 10:59 PM

## 2021-04-02 NOTE — ED Notes (Signed)
Accepting IP RN Angelica concerned for pt's hypertension. This RN contacted Cox MD about elevated blood pressure of 160/101, MD Cox stating no need to address, PRN labetalol ordered if SBP >180, advising mild hypertension permitted due to pt's possible stroke status. Accepting IP RN Angelica made aware.

## 2021-04-02 NOTE — Progress Notes (Signed)
   04/02/21 2010  Clinical Encounter Type  Visited With Patient  Visit Type Initial;Social support;Spiritual support  Referral From Nurse  Consult/Referral To Chaplain   Chaplain responded to a code stroke page. When chaplain arrived in the ED, PT was already taking back for CT scan. Chaplain told ED registration desk to contact Chaplain if any family arrived.

## 2021-04-02 NOTE — ED Notes (Signed)
Pt covid positive per lab, Fuller Plan MD made aware.

## 2021-04-02 NOTE — ED Notes (Addendum)
Pt moving all extremities easily during covid testing, reaching for this RN's arms with bilateral hands, moving both legs off the bed quickly and sitting up.

## 2021-04-02 NOTE — ED Notes (Signed)
While in CT, pt inquiring about this RNs tattoos, able to see imagery on tattoos and describe to this RN.

## 2021-04-02 NOTE — ED Notes (Signed)
Called [facility] to activate Code Stroke.

## 2021-04-03 ENCOUNTER — Inpatient Hospital Stay: Payer: Self-pay

## 2021-04-03 ENCOUNTER — Encounter: Payer: Self-pay | Admitting: Internal Medicine

## 2021-04-03 DIAGNOSIS — Z880 Allergy status to penicillin: Secondary | ICD-10-CM | POA: Diagnosis not present

## 2021-04-03 DIAGNOSIS — J45909 Unspecified asthma, uncomplicated: Secondary | ICD-10-CM | POA: Diagnosis present

## 2021-04-03 DIAGNOSIS — Z79899 Other long term (current) drug therapy: Secondary | ICD-10-CM | POA: Diagnosis not present

## 2021-04-03 DIAGNOSIS — J1282 Pneumonia due to coronavirus disease 2019: Secondary | ICD-10-CM | POA: Diagnosis present

## 2021-04-03 DIAGNOSIS — A4189 Other specified sepsis: Secondary | ICD-10-CM | POA: Diagnosis present

## 2021-04-03 DIAGNOSIS — G4730 Sleep apnea, unspecified: Secondary | ICD-10-CM | POA: Diagnosis present

## 2021-04-03 DIAGNOSIS — F449 Dissociative and conversion disorder, unspecified: Secondary | ICD-10-CM | POA: Diagnosis present

## 2021-04-03 DIAGNOSIS — E785 Hyperlipidemia, unspecified: Secondary | ICD-10-CM | POA: Diagnosis present

## 2021-04-03 DIAGNOSIS — Z6841 Body Mass Index (BMI) 40.0 and over, adult: Secondary | ICD-10-CM | POA: Diagnosis not present

## 2021-04-03 DIAGNOSIS — I10 Essential (primary) hypertension: Secondary | ICD-10-CM | POA: Diagnosis present

## 2021-04-03 DIAGNOSIS — Z7984 Long term (current) use of oral hypoglycemic drugs: Secondary | ICD-10-CM | POA: Diagnosis not present

## 2021-04-03 DIAGNOSIS — I639 Cerebral infarction, unspecified: Secondary | ICD-10-CM | POA: Diagnosis not present

## 2021-04-03 DIAGNOSIS — E1165 Type 2 diabetes mellitus with hyperglycemia: Secondary | ICD-10-CM | POA: Diagnosis present

## 2021-04-03 DIAGNOSIS — R4701 Aphasia: Secondary | ICD-10-CM | POA: Diagnosis present

## 2021-04-03 DIAGNOSIS — A0839 Other viral enteritis: Secondary | ICD-10-CM | POA: Diagnosis present

## 2021-04-03 DIAGNOSIS — U071 COVID-19: Secondary | ICD-10-CM | POA: Diagnosis not present

## 2021-04-03 LAB — COMPREHENSIVE METABOLIC PANEL
ALT: 25 U/L (ref 0–44)
AST: 18 U/L (ref 15–41)
Albumin: 3.4 g/dL — ABNORMAL LOW (ref 3.5–5.0)
Alkaline Phosphatase: 77 U/L (ref 38–126)
Anion gap: 11 (ref 5–15)
BUN: 7 mg/dL (ref 6–20)
CO2: 24 mmol/L (ref 22–32)
Calcium: 8.7 mg/dL — ABNORMAL LOW (ref 8.9–10.3)
Chloride: 102 mmol/L (ref 98–111)
Creatinine, Ser: 0.66 mg/dL (ref 0.44–1.00)
GFR, Estimated: >60 mL/min (ref >60)
Glucose, Bld: 267 mg/dL — ABNORMAL HIGH (ref 70–99)
Potassium: 3.6 mmol/L (ref 3.5–5.1)
Sodium: 137 mmol/L (ref 135–145)
Total Bilirubin: 0.9 mg/dL (ref 0.3–1.2)
Total Protein: 7.5 g/dL (ref 6.5–8.1)

## 2021-04-03 LAB — HEMOGLOBIN A1C
Hgb A1c MFr Bld: 11.6 % — ABNORMAL HIGH (ref 4.8–5.6)
Mean Plasma Glucose: 286.22 mg/dL

## 2021-04-03 LAB — CBC WITH DIFFERENTIAL/PLATELET
Abs Immature Granulocytes: 0.05 10*3/uL (ref 0.00–0.07)
Basophils Absolute: 0 10*3/uL (ref 0.0–0.1)
Basophils Relative: 0 %
Eosinophils Absolute: 0.2 10*3/uL (ref 0.0–0.5)
Eosinophils Relative: 1 %
HCT: 38.4 % (ref 36.0–46.0)
Hemoglobin: 13.1 g/dL (ref 12.0–15.0)
Immature Granulocytes: 0 %
Lymphocytes Relative: 33 %
Lymphs Abs: 3.7 10*3/uL (ref 0.7–4.0)
MCH: 28.7 pg (ref 26.0–34.0)
MCHC: 34.1 g/dL (ref 30.0–36.0)
MCV: 84.2 fL (ref 80.0–100.0)
Monocytes Absolute: 0.8 10*3/uL (ref 0.1–1.0)
Monocytes Relative: 7 %
Neutro Abs: 6.4 10*3/uL (ref 1.7–7.7)
Neutrophils Relative %: 59 %
Platelets: 265 10*3/uL (ref 150–400)
RBC: 4.56 MIL/uL (ref 3.87–5.11)
RDW: 12.2 % (ref 11.5–15.5)
WBC: 11.2 10*3/uL — ABNORMAL HIGH (ref 4.0–10.5)
nRBC: 0 % (ref 0.0–0.2)

## 2021-04-03 LAB — LIPID PANEL
Cholesterol: 205 mg/dL — ABNORMAL HIGH (ref 0–200)
HDL: 37 mg/dL — ABNORMAL LOW (ref >40)
LDL Cholesterol: 134 mg/dL — ABNORMAL HIGH (ref 0–99)
Total CHOL/HDL Ratio: 5.5 RATIO
Triglycerides: 171 mg/dL — ABNORMAL HIGH (ref <150)
VLDL: 34 mg/dL (ref 0–40)

## 2021-04-03 LAB — CBG MONITORING, ED: Glucose-Capillary: 257 mg/dL — ABNORMAL HIGH (ref 70–99)

## 2021-04-03 LAB — C-REACTIVE PROTEIN: CRP: 9.8 mg/dL — ABNORMAL HIGH (ref <1.0)

## 2021-04-03 LAB — GLUCOSE, CAPILLARY
Glucose-Capillary: 306 mg/dL — ABNORMAL HIGH (ref 70–99)
Glucose-Capillary: 309 mg/dL — ABNORMAL HIGH (ref 70–99)
Glucose-Capillary: 284 mg/dL — ABNORMAL HIGH (ref 70–99)

## 2021-04-03 LAB — PROCALCITONIN: Procalcitonin: <0.1 ng/mL

## 2021-04-03 LAB — D-DIMER, QUANTITATIVE
D-Dimer, Quant: 0.62 ug/mL-FEU — ABNORMAL HIGH (ref 0.00–0.50)
D-Dimer, Quant: 0.46 ug/mL-FEU (ref 0.00–0.50)

## 2021-04-03 MED ORDER — SODIUM CHLORIDE 0.9% FLUSH: 10.0000 mL | INTRAVENOUS | Status: DC | PRN

## 2021-04-03 MED ORDER — CHLORHEXIDINE GLUCONATE CLOTH 2 % EX PADS
6.0000 | MEDICATED_PAD | Freq: Every day | CUTANEOUS | Status: DC
  Administered 2021-04-04 – 2021-04-07 (×4): 6 via TOPICAL

## 2021-04-03 MED ORDER — LEVOFLOXACIN IN D5W 750 MG/150ML IV SOLN: 750.0000 mg | INTRAVENOUS | Status: DC

## 2021-04-03 MED ORDER — INSULIN GLARGINE 100 UNIT/ML ~~LOC~~ SOLN
30.0000 [IU] | Freq: Every day | SUBCUTANEOUS | Status: DC
  Administered 2021-04-03 – 2021-04-04 (×2): 30 [IU] via SUBCUTANEOUS
  Filled 2021-04-03 (×3): qty 0.3

## 2021-04-03 MED ORDER — SODIUM CHLORIDE 0.9 % IV SOLN
500.0000 mg | INTRAVENOUS | Status: DC
  Administered 2021-04-03: 500 mg via INTRAVENOUS
  Filled 2021-04-03 (×3): qty 500

## 2021-04-03 MED ORDER — SODIUM CHLORIDE 0.9 % IV SOLN: 500.0000 mg | INTRAVENOUS | Status: DC

## 2021-04-03 MED ORDER — SODIUM CHLORIDE 0.9% FLUSH
10.0000 mL | Freq: Two times a day (BID) | INTRAVENOUS | Status: DC
  Administered 2021-04-03 – 2021-04-07 (×8): 10 mL

## 2021-04-03 MED ORDER — DM-GUAIFENESIN ER 30-600 MG PO TB12
1.0000 | ORAL_TABLET | Freq: Two times a day (BID) | ORAL | Status: DC
  Administered 2021-04-03 – 2021-04-07 (×9): 1 via ORAL
  Filled 2021-04-03 (×9): qty 1

## 2021-04-03 NOTE — Progress Notes (Signed)
Peripherally Inserted Central Catheter Placement  The IV Nurse has discussed with the patient and/or persons authorized to consent for the patient, the purpose of this procedure and the potential benefits and risks involved with this procedure.  The benefits include less needle sticks, lab draws from the catheter, and the patient may be discharged home with the catheter. Risks include, but not limited to, infection, bleeding, blood clot (thrombus formation), and puncture of an artery; nerve damage and irregular heartbeat and possibility to perform a PICC exchange if needed/ordered by physician.  Alternatives to this procedure were also discussed.  Bard Power PICC patient education guide, fact sheet on infection prevention and patient information card has been provided to patient /or left at bedside.    PICC Placement Documentation  PICC Double Lumen 04/03/21 PICC Right Basilic 43 cm 0 cm (Active)  Indication for Insertion or Continuance of Line Poor Vasculature-patient has had multiple peripheral attempts or PIVs lasting less than 24 hours 04/03/21 1700  Exposed Catheter (cm) 0 cm 04/03/21 1700  Site Assessment Clean;Dry;Intact 04/03/21 1700  Lumen #1 Status Flushed;Saline locked;Blood return noted 04/03/21 1700  Lumen #2 Status Flushed;Saline locked;Blood return noted 04/03/21 1700  Dressing Type Transparent;Securing device 04/03/21 1700  Dressing Status Clean;Dry;Intact 04/03/21 1700  Antimicrobial disc in place? Yes 04/03/21 1700  Safety Lock Not Applicable 04/03/21 1700  Line Care Connections checked and tightened 04/03/21 1700  Dressing Intervention New dressing 04/03/21 1700  Dressing Change Due 04/10/21 04/03/21 1700       Elliot Dally 04/03/2021, 5:46 PM

## 2021-04-03 NOTE — Progress Notes (Signed)
Pt A/Ox4 can verbally respond with clear speech and no issues with hearing. Pt is continent of B/B. Skin warm, dry, and intact with no s/s of impairment noted. Pt arrived to unit with yellow MEWS and was a yellow MEWS for several hours in ED d/t to tachycardia. Address issues and concerns with NP and no orders were given to resolved issues before pt arrived to unit. Assessed pt heart rate manually and lower than telemetry monitor was reporting. Pt IV infiltrated while medication we infusing. Pt advise nurse that the IV finder was used to place IV in ED x2 because the first one infiltrated while she was getting CT done. Nurse writer tried x2 attempts to place IV with vein finder and no success. Pt request to have IV team place IV on day shift, NP was notified. Pt denies acute pain, distress, and discomfort. No behavioral issues noted upon assessment.

## 2021-04-03 NOTE — Progress Notes (Signed)
PROGRESS NOTE    Cassandra Flynn   NAT:[id number]  DOB: August 01, 1996  PCP: [facility] [place]    DOA: 04/02/2021 LOS: 0   Brief Narrative   Cassandra Flynn is a 25 y.o. female with medical history significant for morbid obesity, non-insulin-dependent diabetes mellitus, asthma, presented to the ED of 04/02/21 for chief concerns of slurred speech since 2 pm, headache, nausea, shortness of breath, cough, 2 episodes diarrhea.  Stroke evaluation ongoing, so far negative including CT head, CTA's head and neck and MRI brain.  Patient was found to be positive for Covid-19.  Unvaccinated.    CTA chest negative for PE, but showed pathcy perihilar and basilar airspace disease consistent with multifocal pneumonia (less likely edema given Covid-19 positive).     Assessment & Plan   Principal Problem:   CVA (cerebral vascular accident) [facility]) Active Problems:   Morbid obesity ([place])   Uncontrolled diabetes mellitus ([place])   Conversion disorder   COVID-19 virus infection   Gastroenteritis due to COVID-19 virus   Pneumonia due to COVID-19 virus   Slurred speech - resolved.  Stroke evaluation unremarkable. CT head without contrast- normal MRI brain- normal Neurology was consulted, no further inpatient evaluation needed Pt at functional baseline and independent, no need for PT/OT/SLP evaluations Monitor on telemetry, neurochecks  Sepsis - POA due to  Covid-19 infection with gastroenteritis symptoms, diarrhea Met SIRS with tachycardia, tachypnea, Covid+ = sepsis. CTA chest did show multifocal opacities consistent with pneumonia (negative for PE) IV remdesivir per pharmacy Not hypoxic, no steroids unless hypoxia develops Monitor inflammatory markers, CMP, CBC Incentive spirometry and flutter valve  Supportive care: bronchodilator, antitussives, mucolytic Airborne and contact precautions  Noninsulin-dependent diabetes mellitus- Hold metformin  Sliding scale  Novolog for now  Morbid obesity: Body mass index is 47.16 kg/m.  Complicates overall care and prognosis.  Recommend lifestyle modifications including physical activity and diet for weight loss and overall long-term health.   DVT prophylaxis: Place TED hose Start: 04/02/21 2236   Diet:  Diet Orders (From admission, onward)    Start     Ordered   04/02/21 2237  Diet heart healthy/carb modified Room service appropriate? Yes; Fluid consistency: Thin  Diet effective now       Question Answer Comment  Diet-HS Snack? Nothing   Room service appropriate? Yes   Fluid consistency: Thin      04/02/21 2237            Code Status: Full Code    Subjective 04/03/21    Pt reports having a bad headache today.  Reports productive cough and feeling very run down.  Has not had any more slurred speech or numbness/tingling, focal weakness or other complaints.   Disposition Plan & Communication   Status is: Inpatient  Inpatient appropriate due to severity of illness requiring IV therapies as above.  Dispo: The patient is from: home              Anticipated d/c is to: home              Patient currently not stable for d/c   Difficult to place patient no   Consults, Procedures, Significant Events   Consultants:   none  Procedures:   none  Antimicrobials:  Anti-infectives (From admission, onward)   Start     Dose/Rate Route Frequency Ordered Stop   04/04/21 1000  remdesivir 100 mg in sodium chloride 0.9 % 100 mL IVPB       "  Followed by" Linked Group Details   100 mg 200 mL/hr over 30 Minutes Intravenous Daily 04/02/21 2252 04/08/21 0959   04/03/21 1000  remdesivir 100 mg in sodium chloride 0.9 % 100 mL IVPB  Status:  Discontinued       "Followed by" Linked Group Details   100 mg 200 mL/hr over 30 Minutes Intravenous Daily 04/02/21 2237 04/02/21 2248   04/03/21 0130  remdesivir 100 mg in sodium chloride 0.9 % 100 mL IVPB       "Followed by" Linked Group Details   100 mg 200  mL/hr over 30 Minutes Intravenous  Once 04/02/21 2252     04/03/21 0100  remdesivir 100 mg in sodium chloride 0.9 % 100 mL IVPB       "Followed by" Linked Group Details   100 mg 200 mL/hr over 30 Minutes Intravenous  Once 04/02/21 2252 04/03/21 0309   04/03/21 0030  azithromycin (ZITHROMAX) 500 mg in sodium chloride 0.9 % 250 mL IVPB  Status:  Discontinued        500 mg 250 mL/hr over 60 Minutes Intravenous Every 24 hours 04/03/21 0018 04/03/21 0020   04/03/21 0030  levofloxacin (LEVAQUIN) IVPB 750 mg  Status:  Discontinued        750 mg 100 mL/hr over 90 Minutes Intravenous Every 24 hours 04/03/21 0021 04/03/21 0023   04/03/21 0030  azithromycin (ZITHROMAX) 500 mg in sodium chloride 0.9 % 250 mL IVPB        500 mg 250 mL/hr over 60 Minutes Intravenous Every 24 hours 04/03/21 0024     04/02/21 2245  remdesivir 200 mg in sodium chloride 0.9% 250 mL IVPB  Status:  Discontinued       "Followed by" Linked Group Details   200 mg 580 mL/hr over 30 Minutes Intravenous Once 04/02/21 2237 04/02/21 2248        Micro    Objective   Vitals:   04/03/21 0215 04/03/21 0236 04/03/21 0500 04/03/21 0600  BP: 119/83 (!) 111/92    Pulse: (!) 110 98 99 99  Resp: 16 19    Temp:  98.6 F (37 C)    TempSrc:  Oral    SpO2: 98% 99%    Weight:      Height:        Intake/Output Summary (Last 24 hours) at 04/03/2021 0756 Last data filed at 04/03/2021 0300 Gross per 24 hour  Intake 240 ml  Output --  Net 240 ml   Filed Weights   04/02/21 2121  Weight: (!) 153.4 kg    Physical Exam:  General exam: awake, alert, no acute distress Respiratory system: diminished due to body habitus, no wheezes, rales or rhonchi, normal respiratory effort, on room air. Cardiovascular system: normal S1/S2, RRR, no pedal edema.   Gastrointestinal system: soft, NT, ND Central nervous system: A&O x3. no gross focal neurologic deficits, normal speech Skin: dry, intact, normal temperature Psychiatry: normal mood,  congruent affect, judgement and insight appear normal  Labs   Data Reviewed: I have personally reviewed following labs and imaging studies  CBC: Recent Labs  Lab 04/02/21 2009 04/03/21 0526  WBC 12.6* 11.2*  NEUTROABS 8.6* 6.4  HGB 13.9 13.1  HCT 40.6 38.4  MCV 84.6 84.2  PLT 278 326   Basic Metabolic Panel: Recent Labs  Lab 04/02/21 2009 04/03/21 0526  NA 134* 137  K 3.6 3.6  CL 98 102  CO2 22 24  GLUCOSE 369* 267*  BUN 7 7  CREATININE 0.68 0.66  CALCIUM 9.3 8.7*   GFR: Estimated Creatinine Clearance: 180.7 mL/min (by C-G formula based on SCr of 0.66 mg/dL). Liver Function Tests: Recent Labs  Lab 04/02/21 2009 04/03/21 0526  AST 26 18  ALT 32 25  ALKPHOS 88 77  BILITOT 0.9 0.9  PROT 8.1 7.5  ALBUMIN 3.9 3.4*   No results for input(s): LIPASE, AMYLASE in the last 168 hours. No results for input(s): AMMONIA in the last 168 hours. Coagulation Profile: Recent Labs  Lab 04/02/21 2009  INR 1.0   Cardiac Enzymes: No results for input(s): CKTOTAL, CKMB, CKMBINDEX, TROPONINI in the last 168 hours. BNP (last 3 results) No results for input(s): PROBNP in the last 8760 hours. HbA1C: No results for input(s): HGBA1C in the last 72 hours. CBG: Recent Labs  Lab 04/03/21 0041  GLUCAP 257*   Lipid Profile: Recent Labs    04/03/21 0526  CHOL 205*  HDL 37*  LDLCALC 134*  TRIG 171*  CHOLHDL 5.5   Thyroid Function Tests: No results for input(s): TSH, T4TOTAL, FREET4, T3FREE, THYROIDAB in the last 72 hours. Anemia Panel: No results for input(s): VITAMINB12, FOLATE, FERRITIN, TIBC, IRON, RETICCTPCT in the last 72 hours. Sepsis Labs: Recent Labs  Lab 04/03/21 0019  PROCALCITON <0.10    Recent Results (from the past 240 hour(s))  Resp Panel by RT-PCR (Flu A&B, Covid) Nasopharyngeal Swab     Status: Abnormal   Collection Time: 04/02/21  8:09 PM   Specimen: Nasopharyngeal Swab; Nasopharyngeal(NP) swabs in vial transport medium  Result Value Ref Range  Status   SARS Coronavirus 2 by RT PCR POSITIVE (A) NEGATIVE Final    Comment: RESULT CALLED TO, READ BACK BY AND VERIFIED WITH: NOTIFIED KATHY PHELTS 04/02/2021 2200 JG (NOTE) SARS-CoV-2 target nucleic acids are DETECTED.  The SARS-CoV-2 RNA is generally detectable in upper respiratory specimens during the acute phase of infection. Positive results are indicative of the presence of the identified virus, but do not rule out bacterial infection or co-infection with other pathogens not detected by the test. Clinical correlation with patient history and other diagnostic information is necessary to determine patient infection status. The expected result is Negative.  Fact Sheet for Patients: EntrepreneurPulse.com.au  Fact Sheet for Healthcare Providers: IncredibleEmployment.be  This test is not yet approved or cleared by the [place] FDA and  has been authorized for detection and/or diagnosis of SARS-CoV-2 by FDA under an Emergency Use Authorization (EUA).  This EUA will remain in effect (meaning this test  can be used) for the duration of  the COVID-19 declaration under Section 564(b)(1) of the Act, 21 U.S.C. section 360bbb-3(b)(1), unless the authorization is terminated or revoked sooner.     Influenza A by PCR NEGATIVE NEGATIVE Final   Influenza B by PCR NEGATIVE NEGATIVE Final    Comment: (NOTE) The Xpert Xpress SARS-CoV-2/FLU/RSV plus assay is intended as an aid in the diagnosis of influenza from Nasopharyngeal swab specimens and should not be used as a sole basis for treatment. Nasal washings and aspirates are unacceptable for Xpert Xpress SARS-CoV-2/FLU/RSV testing.  Fact Sheet for Patients: EntrepreneurPulse.com.au  Fact Sheet for Healthcare Providers: IncredibleEmployment.be  This test is not yet approved or cleared by the [place] FDA and has been authorized for detection and/or diagnosis  of SARS-CoV-2 by FDA under an Emergency Use Authorization (EUA). This EUA will remain in effect (meaning this test can be used) for the duration of the COVID-19 declaration under Section 564(b)(1) of the Act, 21 U.S.C.  section 360bbb-3(b)(1), unless the authorization is terminated or revoked.  Performed at [facility], [address] [place], [place] [address]       Imaging Studies   CT ANGIO CHEST PE W OR WO CONTRAST  Result Date: 04/02/2021 CLINICAL DATA:  Chest pain and shortness of breath. Suspected pleurisy or effusion. Suspected COVID positive. Sinus tachycardia. EXAM: CT ANGIOGRAPHY CHEST WITH CONTRAST TECHNIQUE: Multidetector CT imaging of the chest was performed using the standard protocol during bolus administration of intravenous contrast. Multiplanar CT image reconstructions and MIPs were obtained to evaluate the vascular anatomy. CONTRAST:  75 ml OMNIPAQUE IOHEXOL 350 MG/ML SOLN COMPARISON:  08/11/2017 FINDINGS: Cardiovascular: Poor contrast bolus limits the examination but there is moderately good opacification of the central and proximal segmental pulmonary arteries. No focal filling defect is identified. No evidence of significant central pulmonary embolus. Normal heart size. No pericardial effusions. Normal caliber thoracic aorta. No aortic dissection. Great vessel origins are patent. Mediastinum/Nodes: Esophagus is decompressed. Thyroid gland is unremarkable. Anterior mediastinal lymph node on the left measuring 1.5 cm short axis dimension. This is enlarging since previous study. Lungs/Pleura: Patchy perihilar and basilar airspace disease in the lungs could represent edema or multifocal pneumonia. No pleural effusions. No pneumothorax. Upper Abdomen: No acute abnormalities demonstrated in the visualized upper abdomen. Surgical absence of the gallbladder. Musculoskeletal: No chest wall abnormality. No acute or significant osseous findings. Review of the MIP images  confirms the above findings. IMPRESSION: 1. Poor contrast bolus limits the examination but there is moderately good opacification of the central and proximal segmental pulmonary arteries. No evidence of significant central pulmonary embolus. 2. Patchy perihilar and basilar airspace disease in the lungs could represent edema or multifocal pneumonia. 3. Enlarged anterior mediastinal lymph node, likely reactive. Electronically Signed   By: Lucienne Capers M.D.   On: 04/02/2021 23:18   MR BRAIN WO CONTRAST  Result Date: 04/02/2021 CLINICAL DATA:  Slurred speech EXAM: MRI HEAD WITHOUT CONTRAST TECHNIQUE: Multiplanar, multiecho pulse sequences of the brain and surrounding structures were obtained without intravenous contrast. COMPARISON:  None. FINDINGS: Brain: No acute infarct, mass effect or extra-axial collection. No acute or chronic hemorrhage. Normal white matter signal, parenchymal volume and CSF spaces. The midline structures are normal. Vascular: Major flow voids are preserved. Skull and upper cervical spine: Normal calvarium and skull base. Visualized upper cervical spine and soft tissues are normal. Sinuses/Orbits:Bilateral mild maxillary mucosal thickening. Normal orbits. IMPRESSION: Normal brain MRI. Electronically Signed   By: Ulyses Jarred M.D.   On: 04/02/2021 23:58   CT HEAD CODE STROKE WO CONTRAST  Result Date: 04/02/2021 CLINICAL DATA:  Code stroke.  Slurred speech and headache EXAM: CT HEAD WITHOUT CONTRAST TECHNIQUE: Contiguous axial images were obtained from the base of the skull through the vertex without intravenous contrast. COMPARISON:  None. FINDINGS: Brain: There is no mass, hemorrhage or extra-axial collection. The size and configuration of the ventricles and extra-axial CSF spaces are normal. The brain parenchyma is normal, without evidence of acute or chronic infarction. Vascular: No abnormal hyperdensity of the major intracranial arteries or dural venous sinuses. No intracranial  atherosclerosis. Skull: The visualized skull base, calvarium and extracranial soft tissues are normal. Sinuses/Orbits: No fluid levels or advanced mucosal thickening of the visualized paranasal sinuses. No mastoid or middle ear effusion. The orbits are normal. ASPECTS [facility] Stroke Program Early CT Score) - Ganglionic level infarction (caudate, lentiform nuclei, internal capsule, insula, M1-M3 cortex): 7 - Supraganglionic infarction (M4-M6 cortex): 3 Total score (0-10 with  10 being normal): 10 IMPRESSION: 1. Normal head CT. 2. ASPECTS is 10. These results were called by telephone at the time of interpretation on 04/02/2021 at 8:24 pm to provider [facility] , who verbally acknowledged these results. Electronically Signed   By: Ulyses Jarred M.D.   On: 04/02/2021 20:24   CT ANGIO HEAD CODE STROKE  Result Date: 04/02/2021 CLINICAL DATA:  Word-finding difficulty EXAM: CT ANGIOGRAPHY HEAD AND NECK TECHNIQUE: Multidetector CT imaging of the head and neck was performed using the standard protocol during bolus administration of intravenous contrast. Multiplanar CT image reconstructions and MIPs were obtained to evaluate the vascular anatomy. Carotid stenosis measurements (when applicable) are obtained utilizing NASCET criteria, using the distal internal carotid diameter as the denominator. CONTRAST:  18m OMNIPAQUE IOHEXOL 350 MG/ML SOLN COMPARISON:  None. FINDINGS: Quality of the study is degraded by photon starvation artifacts secondary to patient body habitus. CTA NECK FINDINGS SKELETON: There is no bony spinal canal stenosis. No lytic or blastic lesion. OTHER NECK: Normal pharynx, larynx and major salivary glands. No cervical lymphadenopathy. Unremarkable thyroid gland. UPPER CHEST: Anterior right middle lobe atelectasis. AORTIC ARCH: There is no calcific atherosclerosis of the aortic arch. There is no aneurysm, dissection or hemodynamically significant stenosis of the visualized portion of the aorta. Conventional  3 vessel aortic branching pattern. The visualized proximal subclavian arteries are widely patent. RIGHT CAROTID SYSTEM: Normal without aneurysm, dissection or stenosis. LEFT CAROTID SYSTEM: Normal without aneurysm, dissection or stenosis. VERTEBRAL ARTERIES: Left dominant configuration. Both origins are clearly patent. There is no dissection, occlusion or flow-limiting stenosis to the skull base (V1-V3 segments). CTA HEAD FINDINGS POSTERIOR CIRCULATION: --Vertebral arteries: Normal V4 segments. --Inferior cerebellar arteries: Normal. --Basilar artery: Normal. --Superior cerebellar arteries: Normal. --Posterior cerebral arteries (PCA): Normal. ANTERIOR CIRCULATION: --Intracranial internal carotid arteries: Normal. --Anterior cerebral arteries (ACA): Normal. Both A1 segments are present. Patent anterior communicating artery (a-comm). --Middle cerebral arteries (MCA): Normal. VENOUS SINUSES: As permitted by contrast timing, patent. ANATOMIC VARIANTS: None Review of the MIP images confirms the above findings. IMPRESSION: 1. Examination degraded by photon starvation artifacts secondary to patient body habitus. Within that limitation, no emergent large vessel occlusion or high-grade stenosis of the intracranial arteries. 2. Right middle lobe atelectasis. Electronically Signed   By: KUlyses JarredM.D.   On: 04/02/2021 21:55   CT ANGIO NECK CODE STROKE  Result Date: 04/02/2021 CLINICAL DATA:  Word-finding difficulty EXAM: CT ANGIOGRAPHY HEAD AND NECK TECHNIQUE: Multidetector CT imaging of the head and neck was performed using the standard protocol during bolus administration of intravenous contrast. Multiplanar CT image reconstructions and MIPs were obtained to evaluate the vascular anatomy. Carotid stenosis measurements (when applicable) are obtained utilizing NASCET criteria, using the distal internal carotid diameter as the denominator. CONTRAST:  787mOMNIPAQUE IOHEXOL 350 MG/ML SOLN COMPARISON:  None. FINDINGS:  Quality of the study is degraded by photon starvation artifacts secondary to patient body habitus. CTA NECK FINDINGS SKELETON: There is no bony spinal canal stenosis. No lytic or blastic lesion. OTHER NECK: Normal pharynx, larynx and major salivary glands. No cervical lymphadenopathy. Unremarkable thyroid gland. UPPER CHEST: Anterior right middle lobe atelectasis. AORTIC ARCH: There is no calcific atherosclerosis of the aortic arch. There is no aneurysm, dissection or hemodynamically significant stenosis of the visualized portion of the aorta. Conventional 3 vessel aortic branching pattern. The visualized proximal subclavian arteries are widely patent. RIGHT CAROTID SYSTEM: Normal without aneurysm, dissection or stenosis. LEFT CAROTID SYSTEM: Normal without aneurysm, dissection or stenosis. VERTEBRAL ARTERIES: Left  dominant configuration. Both origins are clearly patent. There is no dissection, occlusion or flow-limiting stenosis to the skull base (V1-V3 segments). CTA HEAD FINDINGS POSTERIOR CIRCULATION: --Vertebral arteries: Normal V4 segments. --Inferior cerebellar arteries: Normal. --Basilar artery: Normal. --Superior cerebellar arteries: Normal. --Posterior cerebral arteries (PCA): Normal. ANTERIOR CIRCULATION: --Intracranial internal carotid arteries: Normal. --Anterior cerebral arteries (ACA): Normal. Both A1 segments are present. Patent anterior communicating artery (a-comm). --Middle cerebral arteries (MCA): Normal. VENOUS SINUSES: As permitted by contrast timing, patent. ANATOMIC VARIANTS: None Review of the MIP images confirms the above findings. IMPRESSION: 1. Examination degraded by photon starvation artifacts secondary to patient body habitus. Within that limitation, no emergent large vessel occlusion or high-grade stenosis of the intracranial arteries. 2. Right middle lobe atelectasis. Electronically Signed   By: Ulyses Jarred M.D.   On: 04/02/2021 21:55     Medications   Scheduled Meds: .   stroke: mapping our early stages of recovery book   Does not apply Once  . enoxaparin (LOVENOX) injection  0.5 mg/kg Subcutaneous Q24H  . insulin aspart  0-15 Units Subcutaneous TID WC  . insulin aspart  0-5 Units Subcutaneous QHS   Continuous Infusions: . azithromycin Stopped (04/03/21 0145)  . remdesivir 100 mg in NS 100 mL     Followed by  . [START ON 04/04/2021] remdesivir 100 mg in NS 100 mL         LOS: 0 days    Time spent: 30 minutes    Ezekiel Slocumb, DO [facility]  04/03/2021, 7:56 AM      If 7PM-7AM, please contact night-coverage. How to contact the [facility] Attending or Consulting provider [place] or covering provider during after hours [place], for this patient?    1. Check the care team in [facility] and look for a) attending/consulting TR[facility] provider listed and b) the [facility] team listed 2. Log into [email or web address] and use [place]'s universal password to access. If you do not have the password, please contact the hospital operator. 3. Locate the [facility] provider you are looking for under [facility] and page to a number that you can be directly reached. 4. If you still have difficulty reaching the provider, please page the [facility] (Director on Call) for the Hospitalists listed on amion for assistance.

## 2021-04-03 NOTE — Progress Notes (Signed)
Attempted peripheral  IV restart x 2 unsuccessful attempts using ultrasound. Patient tolerated very poorly,very tense, unable to hold still to obtain access. Veins are deep, unable to thread iv catheters. No suitable vein found for midline placement. Patient states she will "do better if arm can be numbed" for IV access. Recommend PICC placement for IV medications and lab draws. Patient RN to notify MD

## 2021-04-03 NOTE — Progress Notes (Signed)
OT Cancellation Note  Patient Details Name: Cassandra Flynn MRN: [id number] DOB: 1996/07/31   Cancelled Treatment:    Reason Eval/Treat Not Completed: OT screened, no needs identified, will sign off. Orders received and chart reviewed. Per chart review, pt presented to ED with slurred speech, and was admitted for stroke-like symptoms however CT and MRI negative for stroke. Per RN report, pt demonstrates baseline independence to perform ADL and mobility tasks. No skilled OT needs identified at this time. OT will sign off. Please re-consult if additional OT needs arise.  Matthew Folks, OTR/L [facility] [phone]

## 2021-04-03 NOTE — Progress Notes (Signed)
PT Cancellation Note  Patient Details Name: Cassandra Flynn MRN: [id number] DOB: 05-24-1996   Cancelled Treatment:    Reason Eval/Treat Not Completed: PT screened, no needs identified, will sign off Per conversation with nursing pt moving well in room, no nero concerns.  Spoke with attending who agrees no PT needed, orders completed.  Malachi Pro, DPT 04/03/2021, 4:00 PM

## 2021-04-03 NOTE — Plan of Care (Signed)
  Problem: Education: Goal: Knowledge of General Education information will improve Description Including pain rating scale, medication(s)/side effects and non-pharmacologic comfort measures Outcome: Progressing   

## 2021-04-03 NOTE — Hospital Course (Signed)
Cassandra Flynn is a 25 y.o. female with medical history significant for morbid obesity, non-insulin-dependent diabetes mellitus, asthma, presented to the ED of 04/02/21 for chief concerns of slurred speech since 2 pm, headache, nausea, shortness of breath, cough, 2 episodes diarrhea.  Stroke evaluation ongoing, so far negative including CT head, CTA's head and neck and MRI brain.  Patient was found to be positive for Covid-19.  Unvaccinated.    CTA chest negative for PE, but showed pathcy perihilar and basilar airspace disease consistent with multifocal pneumonia (less likely edema given Covid-19 positive).

## 2021-04-03 NOTE — ED Notes (Addendum)
Patient to be transported to inpatient unit. Transport requested.

## 2021-04-04 DIAGNOSIS — U071 COVID-19: Secondary | ICD-10-CM | POA: Diagnosis not present

## 2021-04-04 DIAGNOSIS — A0839 Other viral enteritis: Secondary | ICD-10-CM | POA: Diagnosis not present

## 2021-04-04 DIAGNOSIS — J1282 Pneumonia due to coronavirus disease 2019: Secondary | ICD-10-CM

## 2021-04-04 DIAGNOSIS — E1165 Type 2 diabetes mellitus with hyperglycemia: Secondary | ICD-10-CM | POA: Diagnosis not present

## 2021-04-04 LAB — COMPREHENSIVE METABOLIC PANEL
ALT: 20 U/L (ref 0–44)
AST: 15 U/L (ref 15–41)
Albumin: 3 g/dL — ABNORMAL LOW (ref 3.5–5.0)
Alkaline Phosphatase: 74 U/L (ref 38–126)
Anion gap: 8 (ref 5–15)
BUN: 11 mg/dL (ref 6–20)
CO2: 24 mmol/L (ref 22–32)
Calcium: 8.5 mg/dL — ABNORMAL LOW (ref 8.9–10.3)
Chloride: 102 mmol/L (ref 98–111)
Creatinine, Ser: 0.78 mg/dL (ref 0.44–1.00)
GFR, Estimated: >60 mL/min (ref >60)
Glucose, Bld: 308 mg/dL — ABNORMAL HIGH (ref 70–99)
Potassium: 3.6 mmol/L (ref 3.5–5.1)
Sodium: 134 mmol/L — ABNORMAL LOW (ref 135–145)
Total Bilirubin: 0.6 mg/dL (ref 0.3–1.2)
Total Protein: 7.2 g/dL (ref 6.5–8.1)

## 2021-04-04 LAB — GLUCOSE, CAPILLARY
Glucose-Capillary: 291 mg/dL — ABNORMAL HIGH (ref 70–99)
Glucose-Capillary: 287 mg/dL — ABNORMAL HIGH (ref 70–99)
Glucose-Capillary: 303 mg/dL — ABNORMAL HIGH (ref 70–99)
Glucose-Capillary: 291 mg/dL — ABNORMAL HIGH (ref 70–99)

## 2021-04-04 LAB — CBC WITH DIFFERENTIAL/PLATELET
Abs Immature Granulocytes: 0.04 10*3/uL (ref 0.00–0.07)
Basophils Absolute: 0 10*3/uL (ref 0.0–0.1)
Basophils Relative: 0 %
Eosinophils Absolute: 0.2 10*3/uL (ref 0.0–0.5)
Eosinophils Relative: 2 %
HCT: 37.4 % (ref 36.0–46.0)
Hemoglobin: 12.7 g/dL (ref 12.0–15.0)
Immature Granulocytes: 0 %
Lymphocytes Relative: 36 %
Lymphs Abs: 3.5 10*3/uL (ref 0.7–4.0)
MCH: 28.9 pg (ref 26.0–34.0)
MCHC: 34 g/dL (ref 30.0–36.0)
MCV: 85 fL (ref 80.0–100.0)
Monocytes Absolute: 0.8 10*3/uL (ref 0.1–1.0)
Monocytes Relative: 8 %
Neutro Abs: 5.4 10*3/uL (ref 1.7–7.7)
Neutrophils Relative %: 54 %
Platelets: 253 10*3/uL (ref 150–400)
RBC: 4.4 MIL/uL (ref 3.87–5.11)
RDW: 12 % (ref 11.5–15.5)
WBC: 9.9 10*3/uL (ref 4.0–10.5)
nRBC: 0 % (ref 0.0–0.2)

## 2021-04-04 LAB — D-DIMER, QUANTITATIVE: D-Dimer, Quant: 0.69 ug/mL-FEU — ABNORMAL HIGH (ref 0.00–0.50)

## 2021-04-04 LAB — C-REACTIVE PROTEIN: CRP: 13.9 mg/dL — ABNORMAL HIGH (ref <1.0)

## 2021-04-04 LAB — HIV ANTIBODY (ROUTINE TESTING W REFLEX): HIV Screen 4th Generation wRfx: NONREACTIVE

## 2021-04-04 MED ORDER — AMLODIPINE BESYLATE 5 MG PO TABS
5.0000 mg | ORAL_TABLET | Freq: Every day | ORAL | Status: DC
  Administered 2021-04-04 – 2021-04-06 (×3): 5 mg via ORAL
  Filled 2021-04-04 (×2): qty 1

## 2021-04-04 MED ORDER — POLYETHYLENE GLYCOL 3350 17 G PO PACK
17.0000 g | PACK | Freq: Every day | ORAL | Status: DC
  Filled 2021-04-04 (×2): qty 1

## 2021-04-04 MED ORDER — SENNOSIDES-DOCUSATE SODIUM 8.6-50 MG PO TABS: 1.0000 | ORAL_TABLET | Freq: Every evening | ORAL | Status: DC | PRN

## 2021-04-04 MED ORDER — INSULIN ASPART 100 UNIT/ML ~~LOC~~ SOLN
10.0000 [IU] | Freq: Three times a day (TID) | SUBCUTANEOUS | Status: DC
  Administered 2021-04-04 – 2021-04-05 (×4): 10 [IU] via SUBCUTANEOUS
  Filled 2021-04-04 (×4): qty 1

## 2021-04-04 MED ORDER — ATORVASTATIN CALCIUM 20 MG PO TABS
20.0000 mg | ORAL_TABLET | Freq: Every day | ORAL | Status: DC
  Administered 2021-04-04 – 2021-04-07 (×4): 20 mg via ORAL
  Filled 2021-04-04 (×4): qty 1

## 2021-04-04 MED ORDER — BENZONATATE 100 MG PO CAPS
200.0000 mg | ORAL_CAPSULE | Freq: Two times a day (BID) | ORAL | Status: DC | PRN
  Administered 2021-04-04 – 2021-04-06 (×3): 200 mg via ORAL
  Filled 2021-04-04 (×3): qty 2

## 2021-04-04 NOTE — Progress Notes (Signed)
SLP Cancellation Note  Patient Details Name: Cassandra Flynn MRN: [id number] DOB: Jun 10, 1996   Cancelled treatment:         Chart reviewed. Discussion with Nsg. No needs identified to warrant speech therapy. No further slurred speech, no swallowing problems identified. MRI results showed no acute stroke. Please reconsult if further concerns arise. Will complete order.  Eather Colas 04/04/2021, 11:07 AM

## 2021-04-04 NOTE — Progress Notes (Signed)
Inpatient Diabetes Program Recommendations  AACE/ADA: New Consensus Statement on Inpatient Glycemic Control   Target Ranges:  Prepandial:   less than 140 mg/dL      Peak postprandial:   less than 180 mg/dL (1-2 hours)      Critically ill patients:  140 - 180 mg/dL  Results for Cassandra Flynn, EBEL (MRN [id number]) as of 04/04/2021 10:37  Ref. Range 04/03/2021 00:41 04/03/2021 12:18 04/03/2021 16:11 04/03/2021 21:39 04/04/2021 07:56  Glucose-Capillary Latest Ref Range: 70 - 99 mg/dL 924 (H) 268 (H) 341 (H) 284 (H) 291 (H)   Results for Cassandra Flynn, FEBO (MRN [id number]) as of 04/04/2021 10:37  Ref. Range 04/02/2021 20:09 04/03/2021 05:26 04/04/2021 06:49  Glucose Latest Ref Range: 70 - 99 mg/dL 921 (H) 194 (H) 174 (H)  Hemoglobin A1C Latest Ref Range: 4.8 - 5.6 %  11.6 (H)    Review of Glycemic Control  Diabetes history: DM2 Outpatient Diabetes medications: Metformin 500 mg BID Current orders for Inpatient glycemic control: Lantus 30 units QHS, Novolog 10 units TID with meals, Novolog 0-15 units TID with meals, Novolog 0-5 units QHS  Inpatient Diabetes Program Recommendations:     Insulin: Please consider increasing Lantus to 38 units QHS.  HbgA1C: A1C 11.6% on 04/03/21 indicating an average glucose of 284 mg/dl over the past 2-3 months.  NOTE: Spoke with patient over the phone about diabetes and home regimen for diabetes control. Patient reports being followed by [facility] for diabetes management and currently taking Metformin 500 mg BID as an outpatient for diabetes control. Patient reports taking DM medications as prescribed. Inquired about Glipizide noted on home med list and patient states she has never taken Glipizide and only takes Metformin for DM control.  Patient reports her glucose has been running higher over the past few weeks. She states she had an appointment today at [facility] but missed it due to being in the hospital.  Asked patient to call and let [facility]  know she is in the hospital and reschedule her appointment.    Inquired about prior A1C and patient reports not being able to recall last A1C value. Discussed A1C results (11.6% on 04/03/21) and explained that current A1C indicates an average glucose of 284 mg/dl over the past 2-3 months. Discussed glucose and A1C goals. Discussed importance of checking CBGs and maintaining good CBG control to prevent long-term and short-term complications. Explained how hyperglycemia leads to damage within blood vessels which lead to the common complications seen with uncontrolled diabetes. Stressed to the patient the importance of improving glycemic control to prevent further complications from uncontrolled diabetes. Discussed impact of nutrition, exercise, stress, sickness, and medications on diabetes control. Patient states she drinks mainly water and follows a carb modified diet. Patient denies any recent steroid use and states she has not had any symptoms of COVID over the past few days.  Inquired about using insulin as an outpatient and patient states she has never used insulin and she would prefer to use oral DM medications. Discussed current ordered insulin regimen and glucose trends. Explained that even with ordered insulin, glucose remains elevated and with CVA, concerned about DM control. Patient states she would still prefer to use oral DM medications as an outpatient. Informed patient I would let Dr. Denton Lank know and she could discuss outpatient plan for DM control with her. Stressed to patient that DM needs to get under better control. Asked patient to be sure to reschedule follow up visit with [facility] and asked that she be sure to take glucose log or glucometer to appointment so provider can use the information to make additional adjustments with DM medications if needed.   Patient verbalized understanding of information discussed and reports no further questions at this time related to  diabetes.  Thanks, Orlando Penner, RN, MSN, CDE Diabetes Coordinator Inpatient Diabetes Program [phone] (Team Pager)

## 2021-04-04 NOTE — Progress Notes (Signed)
PROGRESS NOTE    Cassandra Flynn   VXY:[id number]  DOB: 03-28-1996  PCP: [facility] [place]    DOA: 04/02/2021 LOS: 1   Brief Narrative   Cassandra Flynn is a 25 y.o. female with medical history significant for morbid obesity, non-insulin-dependent diabetes mellitus, asthma, presented to the ED of 04/02/21 for chief concerns of slurred speech since 2 pm, headache, nausea, shortness of breath, cough, 2 episodes diarrhea.  Stroke evaluation ongoing, so far negative including CT head, CTA's head and neck and MRI brain.  Patient was found to be positive for Covid-19.  Unvaccinated.    CTA chest negative for PE, but showed pathcy perihilar and basilar airspace disease consistent with multifocal pneumonia (less likely edema given Covid-19 positive).     Assessment & Plan   Principal Problem:   CVA (cerebral vascular accident) [facility]) Active Problems:   Morbid obesity ([place])   Uncontrolled diabetes mellitus ([place])   Conversion disorder   COVID-19 virus infection   Gastroenteritis due to COVID-19 virus   Pneumonia due to COVID-19 virus   Sepsis - POA due to  Covid-19 infection with gastroenteritis symptoms, diarrhea Met SIRS with tachycardia, tachypnea, Covid+ = sepsis. CTA chest did show multifocal opacities consistent with pneumonia (negative for PE) IV remdesivir per pharmacy Not hypoxic, no steroids unless hypoxia develops Monitor inflammatory markers, CMP, CBC Incentive spirometry and flutter valve  Supportive care: bronchodilator, antitussives, mucolytic Airborne and contact precautions   Slurred speech - resolved.  Stroke evaluation unremarkable. CT head without contrast- normal MRI brain- normal Neurology was consulted, no further inpatient evaluation needed Pt at functional baseline and independent, no need for PT/OT/SLP evaluations Monitor on telemetry, neurochecks   Noninsulin-dependent diabetes mellitus- Hold metformin  Lantus 30  units qHS Novolog 10 units TID WC + sliding scale    Hypertension - not on meds, may be undiagnosed chronic problem.   --Started amlodipine --PRN hydralazine  Hyperlipidemia - started on Lipitor 40 mg   Morbid obesity: Body mass index is 47.16 kg/m.  Complicates overall care and prognosis.  Recommend lifestyle modifications including physical activity and diet for weight loss and overall long-term health.   DVT prophylaxis: Place TED hose Start: 04/02/21 2236   Diet:  Diet Orders (From admission, onward)    Start     Ordered   04/02/21 2237  Diet heart healthy/carb modified Room service appropriate? Yes; Fluid consistency: Thin  Diet effective now       Question Answer Comment  Diet-HS Snack? Nothing   Room service appropriate? Yes   Fluid consistency: Thin      04/02/21 2237            Code Status: Full Code    Subjective 04/04/21    Pt reports having still having headaches, each one feels worse than the last one.  Reports being hot & cold on and off all night, also up coughing much of the night.     Disposition Plan & Communication   Status is: Inpatient  Inpatient appropriate due to severity of illness requiring IV therapies as above.  Dispo: The patient is from: home              Anticipated d/c is to: home              Patient currently not stable for d/c   Difficult to place patient no   Consults, Procedures, Significant Events   Consultants:   none  Procedures:   none  Antimicrobials:  Anti-infectives (From admission, onward)   Start     Dose/Rate Route Frequency Ordered Stop   04/04/21 1000  remdesivir 100 mg in sodium chloride 0.9 % 100 mL IVPB       "Followed by" Linked Group Details   100 mg 200 mL/hr over 30 Minutes Intravenous Daily 04/02/21 2252 04/08/21 0959   04/03/21 1000  remdesivir 100 mg in sodium chloride 0.9 % 100 mL IVPB  Status:  Discontinued       "Followed by" Linked Group Details   100 mg 200 mL/hr over 30 Minutes  Intravenous Daily 04/02/21 2237 04/02/21 2248   04/03/21 0130  remdesivir 100 mg in sodium chloride 0.9 % 100 mL IVPB       "Followed by" Linked Group Details   100 mg 200 mL/hr over 30 Minutes Intravenous  Once 04/02/21 2252     04/03/21 0100  remdesivir 100 mg in sodium chloride 0.9 % 100 mL IVPB       "Followed by" Linked Group Details   100 mg 200 mL/hr over 30 Minutes Intravenous  Once 04/02/21 2252 04/04/21 0945   04/03/21 0030  azithromycin (ZITHROMAX) 500 mg in sodium chloride 0.9 % 250 mL IVPB  Status:  Discontinued        500 mg 250 mL/hr over 60 Minutes Intravenous Every 24 hours 04/03/21 0018 04/03/21 0020   04/03/21 0030  levofloxacin (LEVAQUIN) IVPB 750 mg  Status:  Discontinued        750 mg 100 mL/hr over 90 Minutes Intravenous Every 24 hours 04/03/21 0021 04/03/21 0023   04/03/21 0030  azithromycin (ZITHROMAX) 500 mg in sodium chloride 0.9 % 250 mL IVPB  Status:  Discontinued        500 mg 250 mL/hr over 60 Minutes Intravenous Every 24 hours 04/03/21 0024 04/04/21 1146   04/02/21 2245  remdesivir 200 mg in sodium chloride 0.9% 250 mL IVPB  Status:  Discontinued       "Followed by" Linked Group Details   200 mg 580 mL/hr over 30 Minutes Intravenous Once 04/02/21 2237 04/02/21 2248        Micro    Objective   Vitals:   04/04/21 0627 04/04/21 0755 04/04/21 1140 04/04/21 1650  BP: 132/81 (!) 149/97 135/89 139/87  Pulse: (!) 101 96 99 88  Resp: '16 16 18 16  ' Temp: 98.6 F (37 C) 98.8 F (37.1 C) 98.4 F (36.9 C) 98.1 F (36.7 C)  TempSrc: Oral Oral    SpO2: 98% 99% 99% 99%  Weight:      Height:        Intake/Output Summary (Last 24 hours) at 04/04/2021 1744 Last data filed at 04/03/2021 2115 Gross per 24 hour  Intake 240 ml  Output --  Net 240 ml   Filed Weights   04/02/21 2121  Weight: (!) 153.4 kg    Physical Exam:  General exam: awake, alert, no acute distress, obese Respiratory system: diminished, no wheezes, rales or rhonchi heard, normal  respiratory effort, on room air. Cardiovascular system: RRR, no pedal edema.   Central nervous system: A&O x3, normal speech, CN's grossly intact Psychiatry: normal mood, congruent affect, judgement and insight appear normal  Labs   Data Reviewed: I have personally reviewed following labs and imaging studies  CBC: Recent Labs  Lab 04/02/21 2009 04/03/21 0526 04/04/21 0649  WBC 12.6* 11.2* 9.9  NEUTROABS 8.6* 6.4 5.4  HGB 13.9 13.1 12.7  HCT 40.6 38.4 37.4  MCV 84.6  84.2 85.0  PLT 278 265 670   Basic Metabolic Panel: Recent Labs  Lab 04/02/21 2009 04/03/21 0526 04/04/21 0649  NA 134* 137 134*  K 3.6 3.6 3.6  CL 98 102 102  CO2 '22 24 24  ' GLUCOSE 369* 267* 308*  BUN '7 7 11  ' CREATININE 0.68 0.66 0.78  CALCIUM 9.3 8.7* 8.5*   GFR: Estimated Creatinine Clearance: 180.7 mL/min (by C-G formula based on SCr of 0.78 mg/dL). Liver Function Tests: Recent Labs  Lab 04/02/21 2009 04/03/21 0526 04/04/21 0649  AST '26 18 15  ' ALT 32 25 20  ALKPHOS 88 77 74  BILITOT 0.9 0.9 0.6  PROT 8.1 7.5 7.2  ALBUMIN 3.9 3.4* 3.0*   No results for input(s): LIPASE, AMYLASE in the last 168 hours. No results for input(s): AMMONIA in the last 168 hours. Coagulation Profile: Recent Labs  Lab 04/02/21 2009  INR 1.0   Cardiac Enzymes: No results for input(s): CKTOTAL, CKMB, CKMBINDEX, TROPONINI in the last 168 hours. BNP (last 3 results) No results for input(s): PROBNP in the last 8760 hours. HbA1C: Recent Labs    04/03/21 0526  HGBA1C 11.6*   CBG: Recent Labs  Lab 04/03/21 1611 04/03/21 2139 04/04/21 0756 04/04/21 1141 04/04/21 1651  GLUCAP 309* 284* 291* 287* 303*   Lipid Profile: Recent Labs    04/03/21 0526  CHOL 205*  HDL 37*  LDLCALC 134*  TRIG 171*  CHOLHDL 5.5   Thyroid Function Tests: No results for input(s): TSH, T4TOTAL, FREET4, T3FREE, THYROIDAB in the last 72 hours. Anemia Panel: No results for input(s): VITAMINB12, FOLATE, FERRITIN, TIBC, IRON,  RETICCTPCT in the last 72 hours. Sepsis Labs: Recent Labs  Lab 04/03/21 0019  PROCALCITON <0.10    Recent Results (from the past 240 hour(s))  Resp Panel by RT-PCR (Flu A&B, Covid) Nasopharyngeal Swab     Status: Abnormal   Collection Time: 04/02/21  8:09 PM   Specimen: Nasopharyngeal Swab; Nasopharyngeal(NP) swabs in vial transport medium  Result Value Ref Range Status   SARS Coronavirus 2 by RT PCR POSITIVE (A) NEGATIVE Final    Comment: RESULT CALLED TO, READ BACK BY AND VERIFIED WITH: NOTIFIED KATHY PHELTS 04/02/2021 2200 JG (NOTE) SARS-CoV-2 target nucleic acids are DETECTED.  The SARS-CoV-2 RNA is generally detectable in upper respiratory specimens during the acute phase of infection. Positive results are indicative of the presence of the identified virus, but do not rule out bacterial infection or co-infection with other pathogens not detected by the test. Clinical correlation with patient history and other diagnostic information is necessary to determine patient infection status. The expected result is Negative.  Fact Sheet for Patients: EntrepreneurPulse.com.au  Fact Sheet for Healthcare Providers: IncredibleEmployment.be  This test is not yet approved or cleared by the [place] FDA and  has been authorized for detection and/or diagnosis of SARS-CoV-2 by FDA under an Emergency Use Authorization (EUA).  This EUA will remain in effect (meaning this test  can be used) for the duration of  the COVID-19 declaration under Section 564(b)(1) of the Act, 21 U.S.C. section 360bbb-3(b)(1), unless the authorization is terminated or revoked sooner.     Influenza A by PCR NEGATIVE NEGATIVE Final   Influenza B by PCR NEGATIVE NEGATIVE Final    Comment: (NOTE) The Xpert Xpress SARS-CoV-2/FLU/RSV plus assay is intended as an aid in the diagnosis of influenza from Nasopharyngeal swab specimens and should not be used as a sole basis for  treatment. Nasal washings and aspirates are unacceptable  for Xpert Xpress SARS-CoV-2/FLU/RSV testing.  Fact Sheet for Patients: EntrepreneurPulse.com.au  Fact Sheet for Healthcare Providers: IncredibleEmployment.be  This test is not yet approved or cleared by the [place] FDA and has been authorized for detection and/or diagnosis of SARS-CoV-2 by FDA under an Emergency Use Authorization (EUA). This EUA will remain in effect (meaning this test can be used) for the duration of the COVID-19 declaration under Section 564(b)(1) of the Act, 21 U.S.C. section 360bbb-3(b)(1), unless the authorization is terminated or revoked.  Performed at [facility], [address] [place], [place] [address]       Imaging Studies   CT ANGIO CHEST PE W OR WO CONTRAST  Result Date: 04/02/2021 CLINICAL DATA:  Chest pain and shortness of breath. Suspected pleurisy or effusion. Suspected COVID positive. Sinus tachycardia. EXAM: CT ANGIOGRAPHY CHEST WITH CONTRAST TECHNIQUE: Multidetector CT imaging of the chest was performed using the standard protocol during bolus administration of intravenous contrast. Multiplanar CT image reconstructions and MIPs were obtained to evaluate the vascular anatomy. CONTRAST:  75 ml OMNIPAQUE IOHEXOL 350 MG/ML SOLN COMPARISON:  08/11/2017 FINDINGS: Cardiovascular: Poor contrast bolus limits the examination but there is moderately good opacification of the central and proximal segmental pulmonary arteries. No focal filling defect is identified. No evidence of significant central pulmonary embolus. Normal heart size. No pericardial effusions. Normal caliber thoracic aorta. No aortic dissection. Great vessel origins are patent. Mediastinum/Nodes: Esophagus is decompressed. Thyroid gland is unremarkable. Anterior mediastinal lymph node on the left measuring 1.5 cm short axis dimension. This is enlarging since previous study. Lungs/Pleura:  Patchy perihilar and basilar airspace disease in the lungs could represent edema or multifocal pneumonia. No pleural effusions. No pneumothorax. Upper Abdomen: No acute abnormalities demonstrated in the visualized upper abdomen. Surgical absence of the gallbladder. Musculoskeletal: No chest wall abnormality. No acute or significant osseous findings. Review of the MIP images confirms the above findings. IMPRESSION: 1. Poor contrast bolus limits the examination but there is moderately good opacification of the central and proximal segmental pulmonary arteries. No evidence of significant central pulmonary embolus. 2. Patchy perihilar and basilar airspace disease in the lungs could represent edema or multifocal pneumonia. 3. Enlarged anterior mediastinal lymph node, likely reactive. Electronically Signed   By: Lucienne Capers M.D.   On: 04/02/2021 23:18   MR BRAIN WO CONTRAST  Result Date: 04/02/2021 CLINICAL DATA:  Slurred speech EXAM: MRI HEAD WITHOUT CONTRAST TECHNIQUE: Multiplanar, multiecho pulse sequences of the brain and surrounding structures were obtained without intravenous contrast. COMPARISON:  None. FINDINGS: Brain: No acute infarct, mass effect or extra-axial collection. No acute or chronic hemorrhage. Normal white matter signal, parenchymal volume and CSF spaces. The midline structures are normal. Vascular: Major flow voids are preserved. Skull and upper cervical spine: Normal calvarium and skull base. Visualized upper cervical spine and soft tissues are normal. Sinuses/Orbits:Bilateral mild maxillary mucosal thickening. Normal orbits. IMPRESSION: Normal brain MRI. Electronically Signed   By: Ulyses Jarred M.D.   On: 04/02/2021 23:58   CT HEAD CODE STROKE WO CONTRAST  Result Date: 04/02/2021 CLINICAL DATA:  Code stroke.  Slurred speech and headache EXAM: CT HEAD WITHOUT CONTRAST TECHNIQUE: Contiguous axial images were obtained from the base of the skull through the vertex without intravenous  contrast. COMPARISON:  None. FINDINGS: Brain: There is no mass, hemorrhage or extra-axial collection. The size and configuration of the ventricles and extra-axial CSF spaces are normal. The brain parenchyma is normal, without evidence of acute or chronic infarction. Vascular: No abnormal hyperdensity  of the major intracranial arteries or dural venous sinuses. No intracranial atherosclerosis. Skull: The visualized skull base, calvarium and extracranial soft tissues are normal. Sinuses/Orbits: No fluid levels or advanced mucosal thickening of the visualized paranasal sinuses. No mastoid or middle ear effusion. The orbits are normal. ASPECTS [facility] Stroke Program Early CT Score) - Ganglionic level infarction (caudate, lentiform nuclei, internal capsule, insula, M1-M3 cortex): 7 - Supraganglionic infarction (M4-M6 cortex): 3 Total score (0-10 with 10 being normal): 10 IMPRESSION: 1. Normal head CT. 2. ASPECTS is 10. These results were called by telephone at the time of interpretation on 04/02/2021 at 8:24 pm to provider [facility] , who verbally acknowledged these results. Electronically Signed   By: Ulyses Jarred M.D.   On: 04/02/2021 20:24   [place] EKG SITE RITE  Result Date: 04/03/2021 If Site Rite image not attached, placement could not be confirmed due to current cardiac rhythm.  CT ANGIO HEAD CODE STROKE  Result Date: 04/02/2021 CLINICAL DATA:  Word-finding difficulty EXAM: CT ANGIOGRAPHY HEAD AND NECK TECHNIQUE: Multidetector CT imaging of the head and neck was performed using the standard protocol during bolus administration of intravenous contrast. Multiplanar CT image reconstructions and MIPs were obtained to evaluate the vascular anatomy. Carotid stenosis measurements (when applicable) are obtained utilizing NASCET criteria, using the distal internal carotid diameter as the denominator. CONTRAST:  9m OMNIPAQUE IOHEXOL 350 MG/ML SOLN COMPARISON:  None. FINDINGS: Quality of the study is degraded by  photon starvation artifacts secondary to patient body habitus. CTA NECK FINDINGS SKELETON: There is no bony spinal canal stenosis. No lytic or blastic lesion. OTHER NECK: Normal pharynx, larynx and major salivary glands. No cervical lymphadenopathy. Unremarkable thyroid gland. UPPER CHEST: Anterior right middle lobe atelectasis. AORTIC ARCH: There is no calcific atherosclerosis of the aortic arch. There is no aneurysm, dissection or hemodynamically significant stenosis of the visualized portion of the aorta. Conventional 3 vessel aortic branching pattern. The visualized proximal subclavian arteries are widely patent. RIGHT CAROTID SYSTEM: Normal without aneurysm, dissection or stenosis. LEFT CAROTID SYSTEM: Normal without aneurysm, dissection or stenosis. VERTEBRAL ARTERIES: Left dominant configuration. Both origins are clearly patent. There is no dissection, occlusion or flow-limiting stenosis to the skull base (V1-V3 segments). CTA HEAD FINDINGS POSTERIOR CIRCULATION: --Vertebral arteries: Normal V4 segments. --Inferior cerebellar arteries: Normal. --Basilar artery: Normal. --Superior cerebellar arteries: Normal. --Posterior cerebral arteries (PCA): Normal. ANTERIOR CIRCULATION: --Intracranial internal carotid arteries: Normal. --Anterior cerebral arteries (ACA): Normal. Both A1 segments are present. Patent anterior communicating artery (a-comm). --Middle cerebral arteries (MCA): Normal. VENOUS SINUSES: As permitted by contrast timing, patent. ANATOMIC VARIANTS: None Review of the MIP images confirms the above findings. IMPRESSION: 1. Examination degraded by photon starvation artifacts secondary to patient body habitus. Within that limitation, no emergent large vessel occlusion or high-grade stenosis of the intracranial arteries. 2. Right middle lobe atelectasis. Electronically Signed   By: KUlyses JarredM.D.   On: 04/02/2021 21:55   CT ANGIO NECK CODE STROKE  Result Date: 04/02/2021 CLINICAL DATA:   Word-finding difficulty EXAM: CT ANGIOGRAPHY HEAD AND NECK TECHNIQUE: Multidetector CT imaging of the head and neck was performed using the standard protocol during bolus administration of intravenous contrast. Multiplanar CT image reconstructions and MIPs were obtained to evaluate the vascular anatomy. Carotid stenosis measurements (when applicable) are obtained utilizing NASCET criteria, using the distal internal carotid diameter as the denominator. CONTRAST:  724mOMNIPAQUE IOHEXOL 350 MG/ML SOLN COMPARISON:  None. FINDINGS: Quality of the study is degraded by photon starvation artifacts secondary to patient  body habitus. CTA NECK FINDINGS SKELETON: There is no bony spinal canal stenosis. No lytic or blastic lesion. OTHER NECK: Normal pharynx, larynx and major salivary glands. No cervical lymphadenopathy. Unremarkable thyroid gland. UPPER CHEST: Anterior right middle lobe atelectasis. AORTIC ARCH: There is no calcific atherosclerosis of the aortic arch. There is no aneurysm, dissection or hemodynamically significant stenosis of the visualized portion of the aorta. Conventional 3 vessel aortic branching pattern. The visualized proximal subclavian arteries are widely patent. RIGHT CAROTID SYSTEM: Normal without aneurysm, dissection or stenosis. LEFT CAROTID SYSTEM: Normal without aneurysm, dissection or stenosis. VERTEBRAL ARTERIES: Left dominant configuration. Both origins are clearly patent. There is no dissection, occlusion or flow-limiting stenosis to the skull base (V1-V3 segments). CTA HEAD FINDINGS POSTERIOR CIRCULATION: --Vertebral arteries: Normal V4 segments. --Inferior cerebellar arteries: Normal. --Basilar artery: Normal. --Superior cerebellar arteries: Normal. --Posterior cerebral arteries (PCA): Normal. ANTERIOR CIRCULATION: --Intracranial internal carotid arteries: Normal. --Anterior cerebral arteries (ACA): Normal. Both A1 segments are present. Patent anterior communicating artery (a-comm). --Middle  cerebral arteries (MCA): Normal. VENOUS SINUSES: As permitted by contrast timing, patent. ANATOMIC VARIANTS: None Review of the MIP images confirms the above findings. IMPRESSION: 1. Examination degraded by photon starvation artifacts secondary to patient body habitus. Within that limitation, no emergent large vessel occlusion or high-grade stenosis of the intracranial arteries. 2. Right middle lobe atelectasis. Electronically Signed   By: Ulyses Jarred M.D.   On: 04/02/2021 21:55     Medications   Scheduled Meds: .  stroke: mapping our early stages of recovery book   Does not apply Once  . amLODipine  5 mg Oral Daily  . atorvastatin  20 mg Oral Daily  . Chlorhexidine Gluconate Cloth  6 each Topical Daily  . dextromethorphan-guaiFENesin  1 tablet Oral BID  . enoxaparin (LOVENOX) injection  0.5 mg/kg Subcutaneous Q24H  . insulin aspart  0-15 Units Subcutaneous TID WC  . insulin aspart  0-5 Units Subcutaneous QHS  . insulin aspart  10 Units Subcutaneous TID WC  . insulin glargine  30 Units Subcutaneous QHS  . polyethylene glycol  17 g Oral Daily  . sodium chloride flush  10-40 mL Intracatheter Q12H   Continuous Infusions: . remdesivir 100 mg in NS 100 mL     Followed by  . remdesivir 100 mg in NS 100 mL 100 mg (04/04/21 0953)       LOS: 1 day    Time spent: 30 minutes with > 50% spent at bedside and in coordination of care.    Ezekiel Slocumb, DO [facility]  04/04/2021, 5:44 PM      If 7PM-7AM, please contact night-coverage. How to contact the [facility] Attending or Consulting provider [place] or covering provider during after hours [place], for this patient?    1. Check the care team in [facility] and look for a) attending/consulting [facility] provider listed and b) the [facility] team listed 2. Log into [email or web address] and use [place] universal password to access. If you do not have the password, please contact the hospital operator. 3. Locate the [facility] provider you are looking for  under [facility] and page to a number that you can be directly reached. 4. If you still have difficulty reaching the provider, please page the [facility] (Director on Call) for the Hospitalists listed on amion for assistance.

## 2021-04-04 NOTE — Plan of Care (Signed)
  Problem: Education: Goal: Knowledge of General Education information will improve Description: Including pain rating scale, medication(s)/side effects and non-pharmacologic comfort measures Outcome: Progressing   Problem: Health Behavior/Discharge Planning: Goal: Ability to manage health-related needs will improve Outcome: Progressing   Problem: Clinical Measurements: Goal: Ability to maintain clinical measurements within normal limits will improve Outcome: Progressing Goal: Will remain free from infection Outcome: Progressing Goal: Diagnostic test results will improve Outcome: Progressing Goal: Respiratory complications will improve Outcome: Progressing Goal: Cardiovascular complication will be avoided Outcome: Progressing   Problem: Activity: Goal: Risk for activity intolerance will decrease Outcome: Progressing   Problem: Nutrition: Goal: Adequate nutrition will be maintained Outcome: Progressing   Problem: Coping: Goal: Level of anxiety will decrease Outcome: Progressing   Problem: Elimination: Goal: Will not experience complications related to bowel motility Outcome: Progressing Goal: Will not experience complications related to urinary retention Outcome: Progressing   Problem: Pain Managment: Goal: General experience of comfort will improve Outcome: Progressing   Problem: Safety: Goal: Ability to remain free from injury will improve Outcome: Progressing   Problem: Skin Integrity: Goal: Risk for impaired skin integrity will decrease Outcome: Progressing   Problem: Education: Goal: Knowledge of risk factors and measures for prevention of condition will improve Outcome: Progressing   Problem: Coping: Goal: Psychosocial and spiritual needs will be supported Outcome: Progressing   Problem: Respiratory: Goal: Will maintain a patent airway Outcome: Progressing Goal: Complications related to the disease process, condition or treatment will be avoided or  minimized Outcome: Progressing   Problem: Education: Goal: Knowledge of General Education information will improve Description: Including pain rating scale, medication(s)/side effects and non-pharmacologic comfort measures Outcome: Progressing   Problem: Health Behavior/Discharge Planning: Goal: Ability to manage health-related needs will improve Outcome: Progressing   Problem: Clinical Measurements: Goal: Ability to maintain clinical measurements within normal limits will improve Outcome: Progressing Goal: Will remain free from infection Outcome: Progressing Goal: Diagnostic test results will improve Outcome: Progressing Goal: Respiratory complications will improve Outcome: Progressing Goal: Cardiovascular complication will be avoided Outcome: Progressing   Problem: Activity: Goal: Risk for activity intolerance will decrease Outcome: Progressing   Problem: Nutrition: Goal: Adequate nutrition will be maintained Outcome: Progressing   Problem: Coping: Goal: Level of anxiety will decrease Outcome: Progressing   Problem: Elimination: Goal: Will not experience complications related to bowel motility Outcome: Progressing Goal: Will not experience complications related to urinary retention Outcome: Progressing   Problem: Pain Managment: Goal: General experience of comfort will improve Outcome: Progressing   Problem: Safety: Goal: Ability to remain free from injury will improve Outcome: Progressing   Problem: Skin Integrity: Goal: Risk for impaired skin integrity will decrease Outcome: Progressing   

## 2021-04-05 DIAGNOSIS — A0839 Other viral enteritis: Secondary | ICD-10-CM | POA: Diagnosis not present

## 2021-04-05 DIAGNOSIS — J1282 Pneumonia due to coronavirus disease 2019: Secondary | ICD-10-CM | POA: Diagnosis not present

## 2021-04-05 DIAGNOSIS — E1165 Type 2 diabetes mellitus with hyperglycemia: Secondary | ICD-10-CM | POA: Diagnosis not present

## 2021-04-05 DIAGNOSIS — U071 COVID-19: Secondary | ICD-10-CM | POA: Diagnosis not present

## 2021-04-05 LAB — CBC WITH DIFFERENTIAL/PLATELET
Abs Immature Granulocytes: 0.05 10*3/uL (ref 0.00–0.07)
Basophils Absolute: 0 10*3/uL (ref 0.0–0.1)
Basophils Relative: 0 %
Eosinophils Absolute: 0.2 10*3/uL (ref 0.0–0.5)
Eosinophils Relative: 2 %
HCT: 39.3 % (ref 36.0–46.0)
Hemoglobin: 13.8 g/dL (ref 12.0–15.0)
Immature Granulocytes: 1 %
Lymphocytes Relative: 44 %
Lymphs Abs: 4.2 10*3/uL — ABNORMAL HIGH (ref 0.7–4.0)
MCH: 29.5 pg (ref 26.0–34.0)
MCHC: 35.1 g/dL (ref 30.0–36.0)
MCV: 84 fL (ref 80.0–100.0)
Monocytes Absolute: 0.7 10*3/uL (ref 0.1–1.0)
Monocytes Relative: 7 %
Neutro Abs: 4.5 10*3/uL (ref 1.7–7.7)
Neutrophils Relative %: 46 %
Platelets: 259 10*3/uL (ref 150–400)
RBC: 4.68 MIL/uL (ref 3.87–5.11)
RDW: 12 % (ref 11.5–15.5)
Smear Review: NORMAL
WBC: 9.6 10*3/uL (ref 4.0–10.5)
nRBC: 0 % (ref 0.0–0.2)

## 2021-04-05 LAB — COMPREHENSIVE METABOLIC PANEL
ALT: 21 U/L (ref 0–44)
AST: 22 U/L (ref 15–41)
Albumin: 3.2 g/dL — ABNORMAL LOW (ref 3.5–5.0)
Alkaline Phosphatase: 76 U/L (ref 38–126)
Anion gap: 10 (ref 5–15)
BUN: 13 mg/dL (ref 6–20)
CO2: 23 mmol/L (ref 22–32)
Calcium: 9 mg/dL (ref 8.9–10.3)
Chloride: 103 mmol/L (ref 98–111)
Creatinine, Ser: 0.55 mg/dL (ref 0.44–1.00)
GFR, Estimated: >60 mL/min (ref >60)
Glucose, Bld: 246 mg/dL — ABNORMAL HIGH (ref 70–99)
Potassium: 3.7 mmol/L (ref 3.5–5.1)
Sodium: 136 mmol/L (ref 135–145)
Total Bilirubin: 0.6 mg/dL (ref 0.3–1.2)
Total Protein: 7.6 g/dL (ref 6.5–8.1)

## 2021-04-05 LAB — GLUCOSE, CAPILLARY
Glucose-Capillary: 254 mg/dL — ABNORMAL HIGH (ref 70–99)
Glucose-Capillary: 319 mg/dL — ABNORMAL HIGH (ref 70–99)
Glucose-Capillary: 249 mg/dL — ABNORMAL HIGH (ref 70–99)
Glucose-Capillary: 347 mg/dL — ABNORMAL HIGH (ref 70–99)

## 2021-04-05 LAB — D-DIMER, QUANTITATIVE: D-Dimer, Quant: 0.66 ug/mL-FEU — ABNORMAL HIGH (ref 0.00–0.50)

## 2021-04-05 LAB — C-REACTIVE PROTEIN: CRP: 9.2 mg/dL — ABNORMAL HIGH (ref <1.0)

## 2021-04-05 MED ORDER — INSULIN GLARGINE 100 UNIT/ML ~~LOC~~ SOLN
38.0000 [IU] | Freq: Every day | SUBCUTANEOUS | Status: DC
  Administered 2021-04-05: 22:00:00 38 [IU] via SUBCUTANEOUS
  Filled 2021-04-05 (×2): qty 0.38

## 2021-04-05 MED ORDER — INSULIN STARTER KIT- PEN NEEDLES (ENGLISH)
1.0000 | Freq: Once | Status: AC
  Administered 2021-04-05: 13:00:00 1
  Filled 2021-04-05: qty 1

## 2021-04-05 MED ORDER — ACETAMINOPHEN 500 MG PO TABS
1000.0000 mg | ORAL_TABLET | Freq: Four times a day (QID) | ORAL | Status: DC | PRN
  Administered 2021-04-05 – 2021-04-06 (×6): 1000 mg via ORAL
  Filled 2021-04-05 (×6): qty 2

## 2021-04-05 MED ORDER — INSULIN ASPART 100 UNIT/ML ~~LOC~~ SOLN
15.0000 [IU] | Freq: Three times a day (TID) | SUBCUTANEOUS | Status: DC
  Administered 2021-04-05 – 2021-04-06 (×3): 15 [IU] via SUBCUTANEOUS
  Filled 2021-04-05 (×2): qty 1

## 2021-04-05 NOTE — Progress Notes (Addendum)
Inpatient Diabetes Program Recommendations  AACE/ADA: New Consensus Statement on Inpatient Glycemic Control   Target Ranges:  Prepandial:   less than 140 mg/dL      Peak postprandial:   less than 180 mg/dL (1-2 hours)      Critically ill patients:  140 - 180 mg/dL   Results for Cassandra Flynn, Cassandra Flynn (MRN [id number]) as of 04/05/2021 08:21  Ref. Range 04/04/2021 07:56 04/04/2021 11:41 04/04/2021 16:51 04/04/2021 20:56 04/05/2021 08:11  Glucose-Capillary Latest Ref Range: 70 - 99 mg/dL 291 (H) 287 (H) 303 (H) 291 (H) 254 (H)   Review of Glycemic Control  Diabetes history: DM2 Outpatient Diabetes medications: Metformin 500 mg BID Current orders for Inpatient glycemic control: Lantus 30 units QHS, Novolog 10 units TID with meals, Novolog 0-15 units TID with meals, Novolog 0-5 units QHS  Inpatient Diabetes Program Recommendations:    Insulin: Please consider increasing Lantus to 38 units QHS and meal coverage to Novolog 15 units TID with meals.  HbgA1C: A1C 11.6% on 04/03/21 indicating an average glucose of 284 mg/dl over the past 2-3 months.  NOTE: Given the amount of insulin patient is requiring, glucose trends, and A1C, would recommend patient be discharged on insulin. Ordered insulin starter kit and patient education by bedside RNs on insulin administration. Nursing, please allow patient to self administer insulin while inpatient in case she is discharged new to insulin. Will plan to talk with patient again today.  Addendum 04/05/21'@10' :59-Spoke with patient over the phone again regarding glycemic control and potential need to be discharged on insulin. Discussed glucose trends, insulin needs, and reviewed A1C. Explained that given all that and with being admitted with CVA, it would be recommended that she be discharged on insulin. Patient states she understands and is agreeable to take insulin if needed. Patient reports that she use to give her mother insulin injections with insulin pens (last  given 4 years ago). Patient states she is knowledgeable about insulin and feels she will be able to self inject insulin. Discussed insulin pens and reviewed steps of using an insulin pen. Patient states she has received the insulin pen starter kit as well and has it at bedside. Asked patient to review the information. Informed patient that RNs will be working with her on insulin administration and asking her to self administer insulin. Patient verbalized understanding and states she has no questions at this time. If patient is discharged on insulin, at time of discharge, please provide Rx for insulin pens and insulin pen needles [phone]).  Thanks, Barnie Alderman, RN, MSN, CDE Diabetes Coordinator Inpatient Diabetes Program [phone] (Team Pager from 8am to 5pm)

## 2021-04-05 NOTE — Progress Notes (Signed)
PROGRESS NOTE    Cassandra Flynn   KCM:[id number]  DOB: 11/05/1996  [facility]    DOA: 04/02/2021 LOS: 2   Brief Narrative   Cassandra Flynn is a 25 y.o. female with medical history significant for morbid obesity, non-insulin-dependent diabetes mellitus, asthma, presented to the ED of 04/02/21 for chief concerns of slurred speech since 2 pm, headache, nausea, shortness of breath, cough, 2 episodes diarrhea.  Stroke evaluation ongoing, so far negative including CT head, CTA's head and neck and MRI brain.  Patient was found to be positive for Covid-19.  Unvaccinated.    CTA chest negative for PE, but showed pathcy perihilar and basilar airspace disease consistent with multifocal pneumonia (less likely edema given Covid-19 positive).     Assessment & Plan   Principal Problem:   CVA (cerebral vascular accident) [facility]) Active Problems:   Morbid obesity ([place])   Uncontrolled diabetes mellitus ([place])   Conversion disorder   COVID-19 virus infection   Gastroenteritis due to COVID-19 virus   Pneumonia due to COVID-19 virus   Sepsis - POA due to  Covid-19 infection with gastroenteritis symptoms, diarrhea Met SIRS with tachycardia, tachypnea, Covid+ = sepsis. CTA chest did show multifocal opacities consistent with pneumonia (negative for PE) IV remdesivir per pharmacy Not hypoxic, no steroids unless hypoxia develops Monitor inflammatory markers, CMP, CBC Incentive spirometry and flutter valve  Supportive care: bronchodilator, antitussives, mucolytic Airborne and contact precautions   Slurred speech - resolved.  Stroke evaluation unremarkable. CT head without contrast- normal MRI brain- normal Neurology was consulted, no further inpatient evaluation needed Pt at functional baseline and independent, no need for PT/OT/SLP evaluations Monitor on telemetry, neurochecks   Noninsulin-dependent diabetes mellitus- uncontrolled, A1c 11.6% on  04/03/21.  Will require insulin at d/c. Hold metformin  Lantus 30>>38 units qHS Novolog 10>>15 units TID WC + sliding scale  Diabetes coordinator following Very close outpatient follow after d/c: [facility]  Hypertension - not on meds, may be undiagnosed chronic problem.   Started amlodipine PRN hydralazine  Hyperlipidemia - started on Lipitor 40 mg   Morbid obesity: Body mass index is 47.16 kg/m.  Complicates overall care and prognosis.  Recommend lifestyle modifications including physical activity and diet for weight loss and overall long-term health.   DVT prophylaxis: Place TED hose Start: 04/02/21 2236   Diet:  Diet Orders (From admission, onward)    Start     Ordered   04/02/21 2237  Diet heart healthy/carb modified Room service appropriate? Yes; Fluid consistency: Thin  Diet effective now       Question Answer Comment  Diet-HS Snack? Nothing   Room service appropriate? Yes   Fluid consistency: Thin      04/02/21 2237            Code Status: Full Code    Subjective 04/05/21    Pt was sleeping but woke to voice.  Reports up all night with cough, still productive.  Gets hot & cold.  Still feels extremely fatigued.    Disposition Plan & Communication   Status is: Inpatient  Inpatient appropriate due to severity of illness requiring IV therapies as above.  Uncontrolled blood glucose, at risk of hyperglycemic crisis  Dispo: The patient is from: home              Anticipated d/c is to: home              Patient currently not stable for d/c  Difficult to place patient no   Consults, Procedures, Significant Events   Consultants:   none  Procedures:   none  Antimicrobials:  Anti-infectives (From admission, onward)   Start     Dose/Rate Route Frequency Ordered Stop   04/04/21 1000  remdesivir 100 mg in sodium chloride 0.9 % 100 mL IVPB       "Followed by" Linked Group Details   100 mg 200 mL/hr over 30 Minutes Intravenous Daily 04/02/21 2252  04/08/21 0959   04/03/21 1000  remdesivir 100 mg in sodium chloride 0.9 % 100 mL IVPB  Status:  Discontinued       "Followed by" Linked Group Details   100 mg 200 mL/hr over 30 Minutes Intravenous Daily 04/02/21 2237 04/02/21 2248   04/03/21 0130  remdesivir 100 mg in sodium chloride 0.9 % 100 mL IVPB       "Followed by" Linked Group Details   100 mg 200 mL/hr over 30 Minutes Intravenous  Once 04/02/21 2252     04/03/21 0100  remdesivir 100 mg in sodium chloride 0.9 % 100 mL IVPB       "Followed by" Linked Group Details   100 mg 200 mL/hr over 30 Minutes Intravenous  Once 04/02/21 2252 04/04/21 0945   04/03/21 0030  azithromycin (ZITHROMAX) 500 mg in sodium chloride 0.9 % 250 mL IVPB  Status:  Discontinued        500 mg 250 mL/hr over 60 Minutes Intravenous Every 24 hours 04/03/21 0018 04/03/21 0020   04/03/21 0030  levofloxacin (LEVAQUIN) IVPB 750 mg  Status:  Discontinued        750 mg 100 mL/hr over 90 Minutes Intravenous Every 24 hours 04/03/21 0021 04/03/21 0023   04/03/21 0030  azithromycin (ZITHROMAX) 500 mg in sodium chloride 0.9 % 250 mL IVPB  Status:  Discontinued        500 mg 250 mL/hr over 60 Minutes Intravenous Every 24 hours 04/03/21 0024 04/04/21 1146   04/02/21 2245  remdesivir 200 mg in sodium chloride 0.9% 250 mL IVPB  Status:  Discontinued       "Followed by" Linked Group Details   200 mg 580 mL/hr over 30 Minutes Intravenous Once 04/02/21 2237 04/02/21 2248        Micro    Objective   Vitals:   04/05/21 0010 04/05/21 0421 04/05/21 0744 04/05/21 1205  BP: (!) 119/91 (!) 147/83 (!) 135/91 126/88  Pulse: 98 97 91 90  Resp: '20 20 20 20  ' Temp: 98.6 F (37 C) 98.5 F (36.9 C) 98 F (36.7 C) 98.6 F (37 C)  TempSrc:    Oral  SpO2: 98% 98% 99% 97%  Weight:      Height:        Intake/Output Summary (Last 24 hours) at 04/05/2021 1613 Last data filed at 04/04/2021 2147 Gross per 24 hour  Intake 340 ml  Output --  Net 340 ml   Filed Weights   04/02/21  2121  Weight: (!) 153.4 kg    Physical Exam:  General exam: sleeping comfortably, woke to voice, no acute distress, obese Respiratory system: diminished, normal respiratory effort, on room air. Cardiovascular system: RRR, no pedal edema.   Central nervous system: A&O x3, grossly non-focal exam  Labs   Data Reviewed: I have personally reviewed following labs and imaging studies  CBC: Recent Labs  Lab 04/02/21 2009 04/03/21 0526 04/04/21 0649 04/05/21 0400  WBC 12.6* 11.2* 9.9 9.6  NEUTROABS 8.6* 6.4 5.4 4.5  HGB 13.9 13.1 12.7 13.8  HCT 40.6 38.4 37.4 39.3  MCV 84.6 84.2 85.0 84.0  PLT 278 265 253 579   Basic Metabolic Panel: Recent Labs  Lab 04/02/21 2009 04/03/21 0526 04/04/21 0649 04/05/21 0400  NA 134* 137 134* 136  K 3.6 3.6 3.6 3.7  CL 98 102 102 103  CO2 '22 24 24 23  ' GLUCOSE 369* 267* 308* 246*  BUN '7 7 11 13  ' CREATININE 0.68 0.66 0.78 0.55  CALCIUM 9.3 8.7* 8.5* 9.0   GFR: Estimated Creatinine Clearance: 180.7 mL/min (by C-G formula based on SCr of 0.55 mg/dL). Liver Function Tests: Recent Labs  Lab 04/02/21 2009 04/03/21 0526 04/04/21 0649 04/05/21 0400  AST '26 18 15 22  ' ALT 32 '25 20 21  ' ALKPHOS 88 77 74 76  BILITOT 0.9 0.9 0.6 0.6  PROT 8.1 7.5 7.2 7.6  ALBUMIN 3.9 3.4* 3.0* 3.2*   No results for input(s): LIPASE, AMYLASE in the last 168 hours. No results for input(s): AMMONIA in the last 168 hours. Coagulation Profile: Recent Labs  Lab 04/02/21 2009  INR 1.0   Cardiac Enzymes: No results for input(s): CKTOTAL, CKMB, CKMBINDEX, TROPONINI in the last 168 hours. BNP (last 3 results) No results for input(s): PROBNP in the last 8760 hours. HbA1C: Recent Labs    04/03/21 0526  HGBA1C 11.6*   CBG: Recent Labs  Lab 04/04/21 1141 04/04/21 1651 04/04/21 2056 04/05/21 0811 04/05/21 1204  GLUCAP 287* 303* 291* 254* 319*   Lipid Profile: Recent Labs    04/03/21 0526  CHOL 205*  HDL 37*  LDLCALC 134*  TRIG 171*  CHOLHDL 5.5    Thyroid Function Tests: No results for input(s): TSH, T4TOTAL, FREET4, T3FREE, THYROIDAB in the last 72 hours. Anemia Panel: No results for input(s): VITAMINB12, FOLATE, FERRITIN, TIBC, IRON, RETICCTPCT in the last 72 hours. Sepsis Labs: Recent Labs  Lab 04/03/21 0019  PROCALCITON <0.10    Recent Results (from the past 240 hour(s))  Resp Panel by RT-PCR (Flu A&B, Covid) Nasopharyngeal Swab     Status: Abnormal   Collection Time: 04/02/21  8:09 PM   Specimen: Nasopharyngeal Swab; Nasopharyngeal(NP) swabs in vial transport medium  Result Value Ref Range Status   SARS Coronavirus 2 by RT PCR POSITIVE (A) NEGATIVE Final    Comment: RESULT CALLED TO, READ BACK BY AND VERIFIED WITH: NOTIFIED KATHY PHELTS 04/02/2021 2200 JG (NOTE) SARS-CoV-2 target nucleic acids are DETECTED.  The SARS-CoV-2 RNA is generally detectable in upper respiratory specimens during the acute phase of infection. Positive results are indicative of the presence of the identified virus, but do not rule out bacterial infection or co-infection with other pathogens not detected by the test. Clinical correlation with patient history and other diagnostic information is necessary to determine patient infection status. The expected result is Negative.  Fact Sheet for Patients: EntrepreneurPulse.com.au  Fact Sheet for Healthcare Providers: IncredibleEmployment.be  This test is not yet approved or cleared by the [place] FDA and  has been authorized for detection and/or diagnosis of SARS-CoV-2 by FDA under an Emergency Use Authorization (EUA).  This EUA will remain in effect (meaning this test  can be used) for the duration of  the COVID-19 declaration under Section 564(b)(1) of the Act, 21 U.S.C. section 360bbb-3(b)(1), unless the authorization is terminated or revoked sooner.     Influenza A by PCR NEGATIVE NEGATIVE Final   Influenza B by PCR NEGATIVE NEGATIVE Final     Comment: (NOTE) The Xpert Xpress  SARS-CoV-2/FLU/RSV plus assay is intended as an aid in the diagnosis of influenza from Nasopharyngeal swab specimens and should not be used as a sole basis for treatment. Nasal washings and aspirates are unacceptable for Xpert Xpress SARS-CoV-2/FLU/RSV testing.  Fact Sheet for Patients: EntrepreneurPulse.com.au  Fact Sheet for Healthcare Providers: IncredibleEmployment.be  This test is not yet approved or cleared by the [place] FDA and has been authorized for detection and/or diagnosis of SARS-CoV-2 by FDA under an Emergency Use Authorization (EUA). This EUA will remain in effect (meaning this test can be used) for the duration of the COVID-19 declaration under Section 564(b)(1) of the Act, 21 U.S.C. section 360bbb-3(b)(1), unless the authorization is terminated or revoked.  Performed at [facility], [address], [place], [place] [address]       Imaging Studies   No results found.   Medications   Scheduled Meds: .  stroke: mapping our early stages of recovery book   Does not apply Once  . amLODipine  5 mg Oral Daily  . atorvastatin  20 mg Oral Daily  . Chlorhexidine Gluconate Cloth  6 each Topical Daily  . dextromethorphan-guaiFENesin  1 tablet Oral BID  . enoxaparin (LOVENOX) injection  0.5 mg/kg Subcutaneous Q24H  . insulin aspart  0-15 Units Subcutaneous TID WC  . insulin aspart  0-5 Units Subcutaneous QHS  . insulin aspart  10 Units Subcutaneous TID WC  . insulin glargine  30 Units Subcutaneous QHS  . polyethylene glycol  17 g Oral Daily  . sodium chloride flush  10-40 mL Intracatheter Q12H   Continuous Infusions: . remdesivir 100 mg in NS 100 mL     Followed by  . remdesivir 100 mg in NS 100 mL 100 mg (04/05/21 0853)       LOS: 2 days    Time spent: 30 minutes with > 50% spent at bedside and in coordination of care.    Ezekiel Slocumb, DO [facility]  04/05/2021, 4:13 PM      If 7PM-7AM, please contact night-coverage. How to contact the [facility] Attending or Consulting provider [place] or covering provider during after hours [place], for this patient?    1. Check the care team in [facility] and look for a) attending/consulting [facility] provider listed and b) the [facility] team listed 2. Log into [email or web address] and use [place]'s universal password to access. If you do not have the password, please contact the hospital operator. 3. Locate the [facility] provider you are looking for under [facility] and page to a number that you can be directly reached. 4. If you still have difficulty reaching the provider, please page the [facility] (Director on Call) for the Hospitalists listed on amion for assistance.

## 2021-04-05 NOTE — Plan of Care (Signed)
  Problem: Education: Goal: Knowledge of General Education information will improve Description: Including pain rating scale, medication(s)/side effects and non-pharmacologic comfort measures Outcome: Progressing   Problem: Health Behavior/Discharge Planning: Goal: Ability to manage health-related needs will improve Outcome: Progressing   Problem: Clinical Measurements: Goal: Ability to maintain clinical measurements within normal limits will improve Outcome: Progressing Goal: Will remain free from infection Outcome: Progressing Goal: Diagnostic test results will improve Outcome: Progressing Goal: Respiratory complications will improve Outcome: Progressing Goal: Cardiovascular complication will be avoided Outcome: Progressing   Problem: Activity: Goal: Risk for activity intolerance will decrease Outcome: Progressing   Problem: Nutrition: Goal: Adequate nutrition will be maintained Outcome: Progressing   Problem: Coping: Goal: Level of anxiety will decrease Outcome: Progressing   Problem: Elimination: Goal: Will not experience complications related to bowel motility Outcome: Progressing Goal: Will not experience complications related to urinary retention Outcome: Progressing   Problem: Pain Managment: Goal: General experience of comfort will improve Outcome: Progressing   Problem: Safety: Goal: Ability to remain free from injury will improve Outcome: Progressing   Problem: Skin Integrity: Goal: Risk for impaired skin integrity will decrease Outcome: Progressing   Problem: Education: Goal: Knowledge of risk factors and measures for prevention of condition will improve Outcome: Progressing   Problem: Coping: Goal: Psychosocial and spiritual needs will be supported Outcome: Progressing   Problem: Respiratory: Goal: Will maintain a patent airway Outcome: Progressing Goal: Complications related to the disease process, condition or treatment will be avoided or  minimized Outcome: Progressing   Problem: Education: Goal: Knowledge of General Education information will improve Description: Including pain rating scale, medication(s)/side effects and non-pharmacologic comfort measures Outcome: Progressing   Problem: Health Behavior/Discharge Planning: Goal: Ability to manage health-related needs will improve Outcome: Progressing   Problem: Clinical Measurements: Goal: Ability to maintain clinical measurements within normal limits will improve Outcome: Progressing Goal: Will remain free from infection Outcome: Progressing Goal: Diagnostic test results will improve Outcome: Progressing Goal: Respiratory complications will improve Outcome: Progressing Goal: Cardiovascular complication will be avoided Outcome: Progressing   Problem: Activity: Goal: Risk for activity intolerance will decrease Outcome: Progressing   Problem: Nutrition: Goal: Adequate nutrition will be maintained Outcome: Progressing   Problem: Coping: Goal: Level of anxiety will decrease Outcome: Progressing   Problem: Elimination: Goal: Will not experience complications related to bowel motility Outcome: Progressing Goal: Will not experience complications related to urinary retention Outcome: Progressing   Problem: Pain Managment: Goal: General experience of comfort will improve Outcome: Progressing   Problem: Safety: Goal: Ability to remain free from injury will improve Outcome: Progressing   Problem: Skin Integrity: Goal: Risk for impaired skin integrity will decrease Outcome: Progressing   

## 2021-04-06 DIAGNOSIS — I639 Cerebral infarction, unspecified: Secondary | ICD-10-CM | POA: Diagnosis not present

## 2021-04-06 DIAGNOSIS — U071 COVID-19: Secondary | ICD-10-CM | POA: Diagnosis not present

## 2021-04-06 DIAGNOSIS — E1165 Type 2 diabetes mellitus with hyperglycemia: Secondary | ICD-10-CM | POA: Diagnosis not present

## 2021-04-06 LAB — COMPREHENSIVE METABOLIC PANEL
ALT: 27 U/L (ref 0–44)
AST: 29 U/L (ref 15–41)
Albumin: 3.2 g/dL — ABNORMAL LOW (ref 3.5–5.0)
Alkaline Phosphatase: 75 U/L (ref 38–126)
Anion gap: 12 (ref 5–15)
BUN: 15 mg/dL (ref 6–20)
CO2: 21 mmol/L — ABNORMAL LOW (ref 22–32)
Calcium: 9.1 mg/dL (ref 8.9–10.3)
Chloride: 101 mmol/L (ref 98–111)
Creatinine, Ser: 0.64 mg/dL (ref 0.44–1.00)
GFR, Estimated: >60 mL/min (ref >60)
Glucose, Bld: 312 mg/dL — ABNORMAL HIGH (ref 70–99)
Potassium: 4.4 mmol/L (ref 3.5–5.1)
Sodium: 134 mmol/L — ABNORMAL LOW (ref 135–145)
Total Bilirubin: 0.5 mg/dL (ref 0.3–1.2)
Total Protein: 7.5 g/dL (ref 6.5–8.1)

## 2021-04-06 LAB — GLUCOSE, CAPILLARY
Glucose-Capillary: 179 mg/dL — ABNORMAL HIGH (ref 70–99)
Glucose-Capillary: 320 mg/dL — ABNORMAL HIGH (ref 70–99)
Glucose-Capillary: 288 mg/dL — ABNORMAL HIGH (ref 70–99)
Glucose-Capillary: 260 mg/dL — ABNORMAL HIGH (ref 70–99)

## 2021-04-06 LAB — C-REACTIVE PROTEIN: CRP: 5.3 mg/dL — ABNORMAL HIGH (ref <1.0)

## 2021-04-06 MED ORDER — AMLODIPINE BESYLATE 10 MG PO TABS
10.0000 mg | ORAL_TABLET | Freq: Every day | ORAL | Status: DC
  Administered 2021-04-07: 10 mg via ORAL
  Filled 2021-04-06: qty 1

## 2021-04-06 MED ORDER — INSULIN GLARGINE 100 UNIT/ML ~~LOC~~ SOLN
50.0000 [IU] | Freq: Every day | SUBCUTANEOUS | Status: DC
  Administered 2021-04-06: 50 [IU] via SUBCUTANEOUS
  Filled 2021-04-06 (×3): qty 0.5

## 2021-04-06 MED ORDER — ALTEPLASE 2 MG IJ SOLR
2.0000 mg | Freq: Once | INTRAMUSCULAR | Status: AC
  Administered 2021-04-06: 10:00:00 2 mg
  Filled 2021-04-06 (×2): qty 2

## 2021-04-06 MED ORDER — INSULIN ASPART 100 UNIT/ML ~~LOC~~ SOLN
18.0000 [IU] | Freq: Three times a day (TID) | SUBCUTANEOUS | Status: DC
  Administered 2021-04-06 – 2021-04-07 (×3): 18 [IU] via SUBCUTANEOUS
  Filled 2021-04-06 (×3): qty 1

## 2021-04-06 MED ORDER — ALTEPLASE 2 MG IJ SOLR
2.0000 mg | Freq: Once | INTRAMUSCULAR | Status: AC
  Administered 2021-04-06: 2 mg
  Filled 2021-04-06: qty 2

## 2021-04-06 NOTE — Plan of Care (Signed)
  Problem: Education: Goal: Knowledge of General Education information will improve Description: Including pain rating scale, medication(s)/side effects and non-pharmacologic comfort measures Outcome: Progressing   Problem: Health Behavior/Discharge Planning: Goal: Ability to manage health-related needs will improve Outcome: Progressing   Problem: Clinical Measurements: Goal: Ability to maintain clinical measurements within normal limits will improve Outcome: Progressing Goal: Will remain free from infection Outcome: Progressing Goal: Diagnostic test results will improve Outcome: Progressing Goal: Respiratory complications will improve Outcome: Progressing Goal: Cardiovascular complication will be avoided Outcome: Progressing   Problem: Activity: Goal: Risk for activity intolerance will decrease Outcome: Progressing   Problem: Nutrition: Goal: Adequate nutrition will be maintained Outcome: Progressing   Problem: Coping: Goal: Level of anxiety will decrease Outcome: Progressing   Problem: Elimination: Goal: Will not experience complications related to bowel motility Outcome: Progressing Goal: Will not experience complications related to urinary retention Outcome: Progressing   Problem: Pain Managment: Goal: General experience of comfort will improve Outcome: Progressing   Problem: Safety: Goal: Ability to remain free from injury will improve Outcome: Progressing   Problem: Skin Integrity: Goal: Risk for impaired skin integrity will decrease Outcome: Progressing   Problem: Education: Goal: Knowledge of risk factors and measures for prevention of condition will improve Outcome: Progressing   Problem: Coping: Goal: Psychosocial and spiritual needs will be supported Outcome: Progressing   Problem: Respiratory: Goal: Will maintain a patent airway Outcome: Progressing Goal: Complications related to the disease process, condition or treatment will be avoided or  minimized Outcome: Progressing   Problem: Education: Goal: Knowledge of General Education information will improve Description: Including pain rating scale, medication(s)/side effects and non-pharmacologic comfort measures Outcome: Progressing   Problem: Health Behavior/Discharge Planning: Goal: Ability to manage health-related needs will improve Outcome: Progressing   Problem: Clinical Measurements: Goal: Ability to maintain clinical measurements within normal limits will improve Outcome: Progressing Goal: Will remain free from infection Outcome: Progressing Goal: Diagnostic test results will improve Outcome: Progressing Goal: Respiratory complications will improve Outcome: Progressing Goal: Cardiovascular complication will be avoided Outcome: Progressing   Problem: Activity: Goal: Risk for activity intolerance will decrease Outcome: Progressing   Problem: Nutrition: Goal: Adequate nutrition will be maintained Outcome: Progressing   Problem: Coping: Goal: Level of anxiety will decrease Outcome: Progressing   Problem: Elimination: Goal: Will not experience complications related to bowel motility Outcome: Progressing Goal: Will not experience complications related to urinary retention Outcome: Progressing   Problem: Pain Managment: Goal: General experience of comfort will improve Outcome: Progressing   Problem: Safety: Goal: Ability to remain free from injury will improve Outcome: Progressing   Problem: Skin Integrity: Goal: Risk for impaired skin integrity will decrease Outcome: Progressing   

## 2021-04-06 NOTE — Progress Notes (Signed)
PROGRESS NOTE  Cassandra Flynn  JSE:[id number] DOB: 1995/12/29 DOA: 04/02/2021 PCP: [facility]   Brief Narrative: Cassandra Flynn is a 25 y.o. female with a history of morbid obesity, T2DM, asthma who presented to the ED 4/23 with slurred speech as well as shortness of breath and GI symptoms. Stroke work up, including CT head, CTA head and neck, and MRI brain, was negative and slurred speech has resolved. SARS-CoV-2 PCR was positive with patchy perihilar and basilar airspace opacities noted on CTA chest which showed no PE. Remdesivir was started and insulin has been titrated due to severe hyperglycemia for which insulin will be required at discharge.   Assessment & Plan: Principal Problem:   CVA (cerebral vascular accident) [facility]) Active Problems:   Morbid obesity (HCC)   Uncontrolled diabetes mellitus (HCC)   Conversion disorder   COVID-19 virus infection   Gastroenteritis due to COVID-19 virus   Pneumonia due to COVID-19 virus  Sepsis, POA due to covid-19 infection. Resolved.   Covid-19 pneumonia: Multifocal PNA on CTA chest with dyspnea without hypoxia.  - Continue remdesivir given recent onset of symptoms. CRP has shown response beginning 4/27, will recheck in AM.  - Avoiding steroids since not hypoxic and severely hyperglycemic.  - OOB, continue IS, FV - Continue isolation   T2DM: HbA1c 11.6% indicating chronically poor control, will require insulin for adequate control.  - Continue basal-bolus insulin. Lantus 50u qHS; novolog 18u TIDWC + resistant SSI - Will order insulin pens, needles at DC. Getting intensive education with RN and diabetes coordinator.  - Will need CBG checks, records taken to PCP at [facility].   HTN:  - Continue new-start norvasc, increase dose. BPs consistently elevated.  HLD:  - Continue statin (also new-start)  Slurred speech: ?if related to severe hyperglycemia. Negative stroke work up including CT, MRI,  CTAs. Has returned to baseline without deficits. Neurology recommended no further evaluation.   Morbid obesity: Estimated body mass index is 47.16 kg/m as calculated from the following:   Height as of this encounter: 5\' 11"  (1.803 m).   Weight as of this encounter: 153.4 kg.  DVT prophylaxis: Lovenox 0.5mg /kg q24h Code Status: Full Family Communication: None at bedside Disposition Plan:  Status is: Inpatient  Remains inpatient appropriate because:Inpatient level of care appropriate due to severity of illness  Dispo: The patient is from: Home              Anticipated d/c is to: Home              Patient currently is not medically stable to d/c.   Difficult to place patient No  Consultants:   Neurology  Procedures:   None  Antimicrobials:  Remdesivir   Subjective: Remains short of breath at rest, worse with exertion. No chest pain or other new complaints.   Objective: Vitals:   04/06/21 0039 04/06/21 0416 04/06/21 0803 04/06/21 1153  BP: (!) 145/75 (!) 142/80 (!) 144/79 (!) 135/93  Pulse: 89 (!) 102 (!) 106 (!) 105  Resp: 14 16 15 17   Temp: 97.9 F (36.6 C) 98.2 F (36.8 C) 98.2 F (36.8 C) 98.7 F (37.1 C)  TempSrc:   Oral   SpO2: 100% 100% 100% 100%  Weight:      Height:        Intake/Output Summary (Last 24 hours) at 04/06/2021 1441 Last data filed at 04/06/2021 0958 Gross per 24 hour  Intake 490 ml  Output 1000 ml  Net -  510 ml   Filed Weights   04/02/21 2121  Weight: (!) 153.4 kg    Gen: 25 y.o. female in no distress Pulm: Non-labored breathing at rest. Distant.  CV: Regular rate and rhythm. No murmur, rub, or gallop. UTD JVD, No pitting pedal edema. GI: Abdomen soft, non-tender, non-distended, with normoactive bowel sounds. No organomegaly or masses felt. Ext: Warm, no deformities Skin: No rashes, lesions or ulcers on visualized skin. Neuro: Alert and oriented. No focal neurological deficits. Psych: Judgement and insight appear normal. Mood &  affect appropriate.   Data Reviewed: I have personally reviewed following labs and imaging studies  CBC: Recent Labs  Lab 04/02/21 2009 04/03/21 0526 04/04/21 0649 04/05/21 0400  WBC 12.6* 11.2* 9.9 9.6  NEUTROABS 8.6* 6.4 5.4 4.5  HGB 13.9 13.1 12.7 13.8  HCT 40.6 38.4 37.4 39.3  MCV 84.6 84.2 85.0 84.0  PLT 278 265 253 259   Basic Metabolic Panel: Recent Labs  Lab 04/02/21 2009 04/03/21 0526 04/04/21 0649 04/05/21 0400 04/06/21 0417  NA 134* 137 134* 136 134*  K 3.6 3.6 3.6 3.7 4.4  CL 98 102 102 103 101  CO2 22 24 24 23  21*  GLUCOSE 369* 267* 308* 246* 312*  BUN 7 7 11 13 15   CREATININE 0.68 0.66 0.78 0.55 0.64  CALCIUM 9.3 8.7* 8.5* 9.0 9.1   GFR: Estimated Creatinine Clearance: 180.7 mL/min (by C-G formula based on SCr of 0.64 mg/dL). Liver Function Tests: Recent Labs  Lab 04/02/21 2009 04/03/21 0526 04/04/21 0649 04/05/21 0400 04/06/21 0417  AST 26 18 15 22 29   ALT 32 25 20 21 27   ALKPHOS 88 77 74 76 75  BILITOT 0.9 0.9 0.6 0.6 0.5  PROT 8.1 7.5 7.2 7.6 7.5  ALBUMIN 3.9 3.4* 3.0* 3.2* 3.2*   No results for input(s): LIPASE, AMYLASE in the last 168 hours. No results for input(s): AMMONIA in the last 168 hours. Coagulation Profile: Recent Labs  Lab 04/02/21 2009  INR 1.0   Cardiac Enzymes: No results for input(s): CKTOTAL, CKMB, CKMBINDEX, TROPONINI in the last 168 hours. BNP (last 3 results) No results for input(s): PROBNP in the last 8760 hours. HbA1C: No results for input(s): HGBA1C in the last 72 hours. CBG: Recent Labs  Lab 04/05/21 1204 04/05/21 1629 04/05/21 2034 04/06/21 0805 04/06/21 1154  GLUCAP 319* 249* 347* 179* 320*   Lipid Profile: No results for input(s): CHOL, HDL, LDLCALC, TRIG, CHOLHDL, LDLDIRECT in the last 72 hours. Thyroid Function Tests: No results for input(s): TSH, T4TOTAL, FREET4, T3FREE, THYROIDAB in the last 72 hours. Anemia Panel: No results for input(s): VITAMINB12, FOLATE, FERRITIN, TIBC, IRON,  RETICCTPCT in the last 72 hours. Urine analysis:    Component Value Date/Time   COLORURINE YELLOW (A) 04/02/2021 2147   APPEARANCEUR CLEAR (A) 04/02/2021 2147   APPEARANCEUR Clear 06/15/2014 0625   LABSPEC 1.043 (H) 04/02/2021 2147   LABSPEC 1.011 06/15/2014 0625   PHURINE 6.0 04/02/2021 2147   GLUCOSEU >=500 (A) 04/02/2021 2147   GLUCOSEU Negative 06/15/2014 0625   HGBUR SMALL (A) 04/02/2021 2147   BILIRUBINUR NEGATIVE 04/02/2021 2147   BILIRUBINUR Negative 06/15/2014 0625   KETONESUR 5 (A) 04/02/2021 2147   PROTEINUR NEGATIVE 04/02/2021 2147   NITRITE NEGATIVE 04/02/2021 2147   LEUKOCYTESUR NEGATIVE 04/02/2021 2147   LEUKOCYTESUR Negative 06/15/2014 0625   Recent Results (from the past 240 hour(s))  Resp Panel by RT-PCR (Flu A&B, Covid) Nasopharyngeal Swab     Status: Abnormal   Collection Time: 04/02/21  8:09 PM   Specimen: Nasopharyngeal Swab; Nasopharyngeal(NP) swabs in vial transport medium  Result Value Ref Range Status   SARS Coronavirus 2 by RT PCR POSITIVE (A) NEGATIVE Final    Comment: RESULT CALLED TO, READ BACK BY AND VERIFIED WITH: NOTIFIED KATHY PHELTS 04/02/2021 2200 JG (NOTE) SARS-CoV-2 target nucleic acids are DETECTED.  The SARS-CoV-2 RNA is generally detectable in upper respiratory specimens during the acute phase of infection. Positive results are indicative of the presence of the identified virus, but do not rule out bacterial infection or co-infection with other pathogens not detected by the test. Clinical correlation with patient history and other diagnostic information is necessary to determine patient infection status. The expected result is Negative.  Fact Sheet for Patients: BloggerCourse.com  Fact Sheet for Healthcare Providers: SeriousBroker.it  This test is not yet approved or cleared by the [place] FDA and  has been authorized for detection and/or diagnosis of SARS-CoV-2 by FDA under  an Emergency Use Authorization (EUA).  This EUA will remain in effect (meaning this test  can be used) for the duration of  the COVID-19 declaration under Section 564(b)(1) of the Act, 21 U.S.C. section 360bbb-3(b)(1), unless the authorization is terminated or revoked sooner.     Influenza A by PCR NEGATIVE NEGATIVE Final   Influenza B by PCR NEGATIVE NEGATIVE Final    Comment: (NOTE) The Xpert Xpress SARS-CoV-2/FLU/RSV plus assay is intended as an aid in the diagnosis of influenza from Nasopharyngeal swab specimens and should not be used as a sole basis for treatment. Nasal washings and aspirates are unacceptable for Xpert Xpress SARS-CoV-2/FLU/RSV testing.  Fact Sheet for Patients: BloggerCourse.com  Fact Sheet for Healthcare Providers: SeriousBroker.it  This test is not yet approved or cleared by the [place] FDA and has been authorized for detection and/or diagnosis of SARS-CoV-2 by FDA under an Emergency Use Authorization (EUA). This EUA will remain in effect (meaning this test can be used) for the duration of the COVID-19 declaration under Section 564(b)(1) of the Act, 21 U.S.C. section 360bbb-3(b)(1), unless the authorization is terminated or revoked.  Performed at [facility], [address] [place], [place] [address]       Radiology Studies: No results found.  Scheduled Meds: .  stroke: mapping our early stages of recovery book   Does not apply Once  . amLODipine  5 mg Oral Daily  . atorvastatin  20 mg Oral Daily  . Chlorhexidine Gluconate Cloth  6 each Topical Daily  . dextromethorphan-guaiFENesin  1 tablet Oral BID  . enoxaparin (LOVENOX) injection  0.5 mg/kg Subcutaneous Q24H  . insulin aspart  0-15 Units Subcutaneous TID WC  . insulin aspart  0-5 Units Subcutaneous QHS  . insulin aspart  15 Units Subcutaneous TID WC  . insulin glargine  38 Units Subcutaneous QHS  . polyethylene glycol  17 g  Oral Daily  . sodium chloride flush  10-40 mL Intracatheter Q12H   Continuous Infusions: . remdesivir 100 mg in NS 100 mL     Followed by  . remdesivir 100 mg in NS 100 mL 100 mg (04/06/21 1321)     LOS: 3 days   Time spent: 35 minutes.  Tyrone Nine, MD [facility] [email or web address] 04/06/2021, 2:41 PM

## 2021-04-06 NOTE — Progress Notes (Signed)
Blue top clotted after two attempts to draw from PICC line. PICC line is hard to flush and has sluggish blood return. PICC is positional. Lab informed to do a peripheral stick but pt refused. Will attempt later.

## 2021-04-07 DIAGNOSIS — F449 Dissociative and conversion disorder, unspecified: Secondary | ICD-10-CM

## 2021-04-07 DIAGNOSIS — A0839 Other viral enteritis: Secondary | ICD-10-CM | POA: Diagnosis not present

## 2021-04-07 DIAGNOSIS — U071 COVID-19: Secondary | ICD-10-CM | POA: Diagnosis not present

## 2021-04-07 LAB — C-REACTIVE PROTEIN: CRP: 3.1 mg/dL — ABNORMAL HIGH (ref <1.0)

## 2021-04-07 LAB — GLUCOSE, CAPILLARY
Glucose-Capillary: 229 mg/dL — ABNORMAL HIGH (ref 70–99)
Glucose-Capillary: 305 mg/dL — ABNORMAL HIGH (ref 70–99)

## 2021-04-07 MED ORDER — LIVING WELL WITH DIABETES BOOK
Freq: Once | Status: AC
  Filled 2021-04-07: qty 1

## 2021-04-07 MED ORDER — LANTUS SOLOSTAR 100 UNIT/ML ~~LOC~~ SOPN: 55.0000 [IU] | PEN_INJECTOR | Freq: Every day | SUBCUTANEOUS | 1 refills | Status: DC

## 2021-04-07 MED ORDER — INSULIN ASPART 100 UNIT/ML FLEXPEN: 20.0000 [IU] | PEN_INJECTOR | Freq: Three times a day (TID) | SUBCUTANEOUS | 1 refills | Status: DC

## 2021-04-07 MED ORDER — DM-GUAIFENESIN ER 30-600 MG PO TB12: 1.0000 | ORAL_TABLET | Freq: Two times a day (BID) | ORAL | 0 refills | Status: DC

## 2021-04-07 MED ORDER — METFORMIN HCL 500 MG PO TABS
1000.0000 mg | ORAL_TABLET | Freq: Two times a day (BID) | ORAL | 0 refills | Status: DC
Start: 2021-04-07 — End: 2024-01-01

## 2021-04-07 MED ORDER — BLOOD GLUCOSE MONITOR KIT: PACK | 0 refills | Status: DC

## 2021-04-07 MED ORDER — INSULIN PEN NEEDLE 32G X 4 MM MISC: 0 refills | Status: DC

## 2021-04-07 NOTE — Discharge Summary (Signed)
Physician Discharge Summary  Cassandra Flynn MRN:[id number] DOB: 06/29/1996 DOA: 04/02/2021  PCP: [facility]  Admit date: 04/02/2021 Discharge date: 04/07/2021  Admitted From: Home Disposition: Home   Recommendations for Outpatient Follow-up:  1. Follow up with PCP in 1-2 weeks 2. Continue management of IDT2DM, newly starting insulin at discharge.  3. Monitor BP, consider initiation of ACE/ARB and statin.  Home Health: None Equipment/Devices: None Discharge Condition: Stable CODE STATUS: Full Diet recommendation: Heart healthy, carb-modified  Brief/Interim Summary: Cassandra Flynn is a 25 y.o. female with a history of morbid obesity, T2DM, asthma who presented to the ED 4/23 with slurred speech as well as shortness of breath and GI symptoms. Stroke work up, including CT head, CTA head and neck, and MRI brain, was negative and slurred speech has resolved. SARS-CoV-2 PCR was positive with patchy perihilar and basilar airspace opacities noted on CTA chest which showed no PE. Remdesivir was started and insulin has been titrated due to severe hyperglycemia for which insulin will be required at discharge.   Discharge Diagnoses:  Principal Problem:   CVA (cerebral vascular accident) (HCC) Active Problems:   Morbid obesity (HCC)   Uncontrolled diabetes mellitus (HCC)   Conversion disorder   COVID-19 virus infection   Gastroenteritis due to COVID-19 virus   Pneumonia due to COVID-19 virus  Sepsis, POA due to covid-19 infection. Resolved.   Covid-19 pneumonia: Multifocal PNA on CTA chest with dyspnea without hypoxia.  - Completed 5 doses of remdesivir, CRP 13.9 >> 3.1. Symptoms improved, no hypoxia.   - Avoiding steroids since not hypoxic and severely hyperglycemic.  - OOB, continue IS, FV - Continue isolation x10 days. Note written for work.  T2DM: HbA1c 11.6% indicating chronically poor control, will require insulin for adequate control.  -  Continue basal-bolus insulin. Lantus 55u daily; novolog 20u TIDWC + moderate SSI ordered as below at discharge. Pt has firm understanding of principles and administration.  - Will need CBG checks, records taken to PCP at [facility].   HTN:  - Consider norvasc at follow up/establishing care with PCP.  HLD:  - Consider statin. Not prescribed due to complexity of medication regimen and financial constraints as well as no stroke.   Slurred speech: ?if related to severe hyperglycemia. Negative stroke work up including CT, MRI, CTAs. Has returned to baseline without deficits. Neurology recommended no further evaluation.   Morbid obesity: Estimated body mass index is 47.16 kg/m   Discharge Instructions Discharge Instructions    Call MD for:  difficulty breathing, headache or visual disturbances   Complete by: As directed    Call MD for:  temperature >100.4   Complete by: As directed    Diet Carb Modified   Complete by: As directed    Discharge instructions   Complete by: As directed    You were admitted for stroke work up which was negative and symptoms resolved. You were also treated for covid-19 infection with improvement. You will need to remain in isolation for 10 total days from positive testing. This means your first day back at work can't be until 5/3, Tuesday. If your symptoms worsen, seek medical attention right away.   It is very important to continue taking insulin to treat your diabetes. This has been prescribed as follows:  - Take lantus 55 units daily for long acting basal coverage - Take novolog 20 units with each meal three times daily with an ADDITIONAL 0-15 units based on your blood sugar at   the time of the meal. The additional carb-correction insulin should follow the following dosing: CBG 70 - 120: 0 units CBG 121 - 150: 2 units CBG 151 - 200: 3 units CBG 201 - 250: 5 units CBG 251 - 300: 8 units CBG 301 - 350: 11 units CBG >350: 15 units - Check blood  sugar first thing in the morning and 3 times daily with meals. Record these values and take them to your PCP appointment next week to help manage your insulin dosing.   Increase activity slowly   Complete by: As directed    MyChart COVID-19 home monitoring program   Complete by: Apr 07, 2021    Is the patient willing to use the [facility] for home monitoring?: Yes   Reason for NOT prescribing anticoagulant therapy at discharge   Complete by: As directed    Reason for not prescribing antIcoagulant at discharge?: Other (Type in Comment field on line #2)   Comment: no stroke     Allergies as of 04/07/2021      Reactions   Penicillins Itching, Swelling, Rash   Has patient had a PCN reaction causing immediate rash, facial/tongue/throat swelling, SOB or lightheadedness with hypotension: Unknown Has patient had a PCN reaction causing severe rash involving mucus membranes or skin necrosis: Unknown Has patient had a PCN reaction that required hospitalization: Unknown Has patient had a PCN reaction occurring within the last 10 years: Unknown If all of the above answers are "NO", then may proceed with Cephalosporin use.      Medication List    STOP taking these medications   alum & mag hydroxide-simeth 200-200-20 MG/5ML suspension Commonly known as: MAALOX/MYLANTA   dicyclomine 10 MG capsule Commonly known as: Bentyl   glipiZIDE 5 MG tablet Commonly known as: Glucotrol   HYDROcodone-acetaminophen 5-325 MG tablet Commonly known as: Norco   ibuprofen 800 MG tablet Commonly known as: ADVIL   OLANZapine 5 MG tablet Commonly known as: ZyPREXA   ondansetron 8 MG disintegrating tablet Commonly known as: Zofran ODT   oxyCODONE-acetaminophen 5-325 MG tablet Commonly known as: Percocet   ranitidine 150 MG capsule Commonly known as: ZANTAC   traZODone 25 mg Tabs tablet Commonly known as: DESYREL     TAKE these medications   albuterol 108 (90 Base) MCG/ACT inhaler Commonly  known as: VENTOLIN HFA Inhale 2 puffs into the lungs every 6 (six) hours as needed for wheezing or shortness of breath.   blood glucose meter kit and supplies Kit Dispense based on patient and insurance preference. Use up to four times daily as directed.   dextromethorphan-guaiFENesin 30-600 MG 12hr tablet Commonly known as: MUCINEX DM Take 1 tablet by mouth 2 (two) times daily.   insulin aspart 100 UNIT/ML FlexPen Commonly known as: NOVOLOG Inject 20-35 Units into the skin 3 (three) times daily with meals. 20 units with meals with additional sliding scale dose based on blood sugar (0-15 units)   Insulin Pen Needle 32G X 4 MM Misc Use to inject insulin 4 times daily as directed   Lantus SoloStar 100 UNIT/ML Solostar Pen Generic drug: insulin glargine Inject 55 Units into the skin daily.   metFORMIN 500 MG tablet Commonly known as: GLUCOPHAGE Take 2 tablets (1,000 mg total) by mouth 2 (two) times daily with a meal.   norelgestromin-ethinyl estradiol 150-35 MCG/24HR transdermal patch Commonly known as: ORTHO EVRA Place 1 patch onto the skin once a week. Reported on 02/14/2016       Follow-up Information  [facility]. Go on 04/13/2021.   Specialty: General Practice Contact information: [address] [place] [address] [phone]              Allergies  Allergen Reactions  . Penicillins Itching, Swelling and Rash    Has patient had a PCN reaction causing immediate rash, facial/tongue/throat swelling, SOB or lightheadedness with hypotension: Unknown Has patient had a PCN reaction causing severe rash involving mucus membranes or skin necrosis: Unknown Has patient had a PCN reaction that required hospitalization: Unknown Has patient had a PCN reaction occurring within the last 10 years: Unknown If all of the above answers are "NO", then may proceed with Cephalosporin use.      Consultations:  Neurology  Procedures/Studies: CT ANGIO CHEST PE W OR WO CONTRAST  Result Date: 04/02/2021 CLINICAL DATA:  Chest pain and shortness of breath. Suspected pleurisy or effusion. Suspected COVID positive. Sinus tachycardia. EXAM: CT ANGIOGRAPHY CHEST WITH CONTRAST TECHNIQUE: Multidetector CT imaging of the chest was performed using the standard protocol during bolus administration of intravenous contrast. Multiplanar CT image reconstructions and MIPs were obtained to evaluate the vascular anatomy. CONTRAST:  75 ml OMNIPAQUE IOHEXOL 350 MG/ML SOLN COMPARISON:  08/11/2017 FINDINGS: Cardiovascular: Poor contrast bolus limits the examination but there is moderately good opacification of the central and proximal segmental pulmonary arteries. No focal filling defect is identified. No evidence of significant central pulmonary embolus. Normal heart size. No pericardial effusions. Normal caliber thoracic aorta. No aortic dissection. Great vessel origins are patent. Mediastinum/Nodes: Esophagus is decompressed. Thyroid gland is unremarkable. Anterior mediastinal lymph node on the left measuring 1.5 cm short axis dimension. This is enlarging since previous study. Lungs/Pleura: Patchy perihilar and basilar airspace disease in the lungs could represent edema or multifocal pneumonia. No pleural effusions. No pneumothorax. Upper Abdomen: No acute abnormalities demonstrated in the visualized upper abdomen. Surgical absence of the gallbladder. Musculoskeletal: No chest wall abnormality. No acute or significant osseous findings. Review of the MIP images confirms the above findings. IMPRESSION: 1. Poor contrast bolus limits the examination but there is moderately good opacification of the central and proximal segmental pulmonary arteries. No evidence of significant central pulmonary embolus. 2. Patchy perihilar and basilar airspace disease in the lungs could represent edema or multifocal pneumonia. 3. Enlarged  anterior mediastinal lymph node, likely reactive. Electronically Signed   By: William  Stevens M.D.   On: 04/02/2021 23:18   MR BRAIN WO CONTRAST  Result Date: 04/02/2021 CLINICAL DATA:  Slurred speech EXAM: MRI HEAD WITHOUT CONTRAST TECHNIQUE: Multiplanar, multiecho pulse sequences of the brain and surrounding structures were obtained without intravenous contrast. COMPARISON:  None. FINDINGS: Brain: No acute infarct, mass effect or extra-axial collection. No acute or chronic hemorrhage. Normal white matter signal, parenchymal volume and CSF spaces. The midline structures are normal. Vascular: Major flow voids are preserved. Skull and upper cervical spine: Normal calvarium and skull base. Visualized upper cervical spine and soft tissues are normal. Sinuses/Orbits:Bilateral mild maxillary mucosal thickening. Normal orbits. IMPRESSION: Normal brain MRI. Electronically Signed   By: Kevin  Herman M.D.   On: 04/02/2021 23:58   CT HEAD CODE STROKE WO CONTRAST  Result Date: 04/02/2021 CLINICAL DATA:  Code stroke.  Slurred speech and headache EXAM: CT HEAD WITHOUT CONTRAST TECHNIQUE: Contiguous axial images were obtained from the base of the skull through the vertex without intravenous contrast. COMPARISON:  None. FINDINGS: Brain: There is no mass, hemorrhage or extra-axial collection. The size and configuration of the ventricles and   extra-axial CSF spaces are normal. The brain parenchyma is normal, without evidence of acute or chronic infarction. Vascular: No abnormal hyperdensity of the major intracranial arteries or dural venous sinuses. No intracranial atherosclerosis. Skull: The visualized skull base, calvarium and extracranial soft tissues are normal. Sinuses/Orbits: No fluid levels or advanced mucosal thickening of the visualized paranasal sinuses. No mastoid or middle ear effusion. The orbits are normal. ASPECTS (Alberta Stroke Program Early CT Score) - Ganglionic level infarction (caudate, lentiform nuclei,  internal capsule, insula, M1-M3 cortex): 7 - Supraganglionic infarction (M4-M6 cortex): 3 Total score (0-10 with 10 being normal): 10 IMPRESSION: 1. Normal head CT. 2. ASPECTS is 10. These results were called by telephone at the time of interpretation on 04/02/2021 at 8:24 pm to provider MARY FUNKE , who verbally acknowledged these results. Electronically Signed   By: Kevin  Herman M.D.   On: 04/02/2021 20:24   US EKG SITE RITE  Result Date: 04/03/2021 If Site Rite image not attached, placement could not be confirmed due to current cardiac rhythm.  CT ANGIO HEAD CODE STROKE  Result Date: 04/02/2021 CLINICAL DATA:  Word-finding difficulty EXAM: CT ANGIOGRAPHY HEAD AND NECK TECHNIQUE: Multidetector CT imaging of the head and neck was performed using the standard protocol during bolus administration of intravenous contrast. Multiplanar CT image reconstructions and MIPs were obtained to evaluate the vascular anatomy. Carotid stenosis measurements (when applicable) are obtained utilizing NASCET criteria, using the distal internal carotid diameter as the denominator. CONTRAST:  75mL OMNIPAQUE IOHEXOL 350 MG/ML SOLN COMPARISON:  None. FINDINGS: Quality of the study is degraded by photon starvation artifacts secondary to patient body habitus. CTA NECK FINDINGS SKELETON: There is no bony spinal canal stenosis. No lytic or blastic lesion. OTHER NECK: Normal pharynx, larynx and major salivary glands. No cervical lymphadenopathy. Unremarkable thyroid gland. UPPER CHEST: Anterior right middle lobe atelectasis. AORTIC ARCH: There is no calcific atherosclerosis of the aortic arch. There is no aneurysm, dissection or hemodynamically significant stenosis of the visualized portion of the aorta. Conventional 3 vessel aortic branching pattern. The visualized proximal subclavian arteries are widely patent. RIGHT CAROTID SYSTEM: Normal without aneurysm, dissection or stenosis. LEFT CAROTID SYSTEM: Normal without aneurysm,  dissection or stenosis. VERTEBRAL ARTERIES: Left dominant configuration. Both origins are clearly patent. There is no dissection, occlusion or flow-limiting stenosis to the skull base (V1-V3 segments). CTA HEAD FINDINGS POSTERIOR CIRCULATION: --Vertebral arteries: Normal V4 segments. --Inferior cerebellar arteries: Normal. --Basilar artery: Normal. --Superior cerebellar arteries: Normal. --Posterior cerebral arteries (PCA): Normal. ANTERIOR CIRCULATION: --Intracranial internal carotid arteries: Normal. --Anterior cerebral arteries (ACA): Normal. Both A1 segments are present. Patent anterior communicating artery (a-comm). --Middle cerebral arteries (MCA): Normal. VENOUS SINUSES: As permitted by contrast timing, patent. ANATOMIC VARIANTS: None Review of the MIP images confirms the above findings. IMPRESSION: 1. Examination degraded by photon starvation artifacts secondary to patient body habitus. Within that limitation, no emergent large vessel occlusion or high-grade stenosis of the intracranial arteries. 2. Right middle lobe atelectasis. Electronically Signed   By: Kevin  Herman M.D.   On: 04/02/2021 21:55   CT ANGIO NECK CODE STROKE  Result Date: 04/02/2021 CLINICAL DATA:  Word-finding difficulty EXAM: CT ANGIOGRAPHY HEAD AND NECK TECHNIQUE: Multidetector CT imaging of the head and neck was performed using the standard protocol during bolus administration of intravenous contrast. Multiplanar CT image reconstructions and MIPs were obtained to evaluate the vascular anatomy. Carotid stenosis measurements (when applicable) are obtained utilizing NASCET criteria, using the distal internal carotid diameter as the denominator. CONTRAST:  75mL OMNIPAQUE   IOHEXOL 350 MG/ML SOLN COMPARISON:  None. FINDINGS: Quality of the study is degraded by photon starvation artifacts secondary to patient body habitus. CTA NECK FINDINGS SKELETON: There is no bony spinal canal stenosis. No lytic or blastic lesion. OTHER NECK: Normal  pharynx, larynx and major salivary glands. No cervical lymphadenopathy. Unremarkable thyroid gland. UPPER CHEST: Anterior right middle lobe atelectasis. AORTIC ARCH: There is no calcific atherosclerosis of the aortic arch. There is no aneurysm, dissection or hemodynamically significant stenosis of the visualized portion of the aorta. Conventional 3 vessel aortic branching pattern. The visualized proximal subclavian arteries are widely patent. RIGHT CAROTID SYSTEM: Normal without aneurysm, dissection or stenosis. LEFT CAROTID SYSTEM: Normal without aneurysm, dissection or stenosis. VERTEBRAL ARTERIES: Left dominant configuration. Both origins are clearly patent. There is no dissection, occlusion or flow-limiting stenosis to the skull base (V1-V3 segments). CTA HEAD FINDINGS POSTERIOR CIRCULATION: --Vertebral arteries: Normal V4 segments. --Inferior cerebellar arteries: Normal. --Basilar artery: Normal. --Superior cerebellar arteries: Normal. --Posterior cerebral arteries (PCA): Normal. ANTERIOR CIRCULATION: --Intracranial internal carotid arteries: Normal. --Anterior cerebral arteries (ACA): Normal. Both A1 segments are present. Patent anterior communicating artery (a-comm). --Middle cerebral arteries (MCA): Normal. VENOUS SINUSES: As permitted by contrast timing, patent. ANATOMIC VARIANTS: None Review of the MIP images confirms the above findings. IMPRESSION: 1. Examination degraded by photon starvation artifacts secondary to patient body habitus. Within that limitation, no emergent large vessel occlusion or high-grade stenosis of the intracranial arteries. 2. Right middle lobe atelectasis. Electronically Signed   By: Ulyses Jarred M.D.   On: 04/02/2021 21:55      Subjective: Wants to go home. No dyspnea or chest pain.  Discharge Exam: Vitals:   04/07/21 0434 04/07/21 0747  BP: 113/76 138/77  Pulse: (!) 109 (!) 106  Resp: 14 17  Temp: 98.9 F (37.2 C) 98.6 F (37 C)  SpO2: 99% 98%   General: Pt is  alert, awake, not in acute distress Cardiovascular: RRR, S1/S2 +, no rubs, no gallops Respiratory: CTA bilaterally, no wheezing, no rhonchi Abdominal: Soft, NT, ND, bowel sounds + Extremities: No edema, no cyanosis  Labs: BNP (last 3 results) No results for input(s): BNP in the last 8760 hours. Basic Metabolic Panel: Recent Labs  Lab 04/02/21 2009 04/03/21 0526 04/04/21 0649 04/05/21 0400 04/06/21 0417  NA 134* 137 134* 136 134*  K 3.6 3.6 3.6 3.7 4.4  CL 98 102 102 103 101  CO2 _0 21*  GLUCOSE 369* 267* 308* 246* 312*  BUN _1 CREATININE 0.68 0.66 0.78 0.55 0.64  CALCIUM 9.3 8.7* 8.5* 9.0 9.1   Liver Function Tests: Recent Labs  Lab 04/02/21 2009 04/03/21 0526 04/04/21 0649 04/05/21 0400 04/06/21 0417  AST _2 ALT 32 _3 ALKPHOS 88 77 74 76 75  BILITOT 0.9 0.9 0.6 0.6 0.5  PROT 8.1 7.5 7.2 7.6 7.5  ALBUMIN 3.9 3.4* 3.0* 3.2* 3.2*   No results for input(s): LIPASE, AMYLASE in the last 168 hours. No results for input(s): AMMONIA in the last 168 hours. CBC: Recent Labs  Lab 04/02/21 2009 04/03/21 0526 04/04/21 0649 04/05/21 0400  WBC 12.6* 11.2* 9.9 9.6  NEUTROABS 8.6* 6.4 5.4 4.5  HGB 13.9 13.1 12.7 13.8  HCT 40.6 38.4 37.4 39.3  MCV 84.6 84.2 85.0 84.0  PLT 278 265 253 259   Cardiac Enzymes: No results for input(s): CKTOTAL, CKMB, CKMBINDEX, TROPONINI in the last 168 hours. BNP: Invalid input(s): POCBNP  CBG: Recent Labs  Lab 04/06/21 0805 04/06/21 1154 04/06/21 1528 04/06/21 2127 04/07/21 0746  GLUCAP 179* 320* 288* 260* 229*   D-Dimer Recent Labs    04/05/21 0400  DDIMER 0.66*   Hgb A1c No results for input(s): HGBA1C in the last 72 hours. Lipid Profile No results for input(s): CHOL, HDL, LDLCALC, TRIG, CHOLHDL, LDLDIRECT in the last 72 hours. Thyroid function studies No results for input(s): TSH, T4TOTAL, T3FREE, THYROIDAB in the last 72 hours.  Invalid input(s): FREET3 Anemia work up No  results for input(s): VITAMINB12, FOLATE, FERRITIN, TIBC, IRON, RETICCTPCT in the last 72 hours. Urinalysis    Component Value Date/Time   COLORURINE YELLOW (A) 04/02/2021 2147   APPEARANCEUR CLEAR (A) 04/02/2021 2147   APPEARANCEUR Clear 06/15/2014 0625   LABSPEC 1.043 (H) 04/02/2021 2147   LABSPEC 1.011 06/15/2014 0625   PHURINE 6.0 04/02/2021 2147   GLUCOSEU >=500 (A) 04/02/2021 2147   GLUCOSEU Negative 06/15/2014 0625   HGBUR SMALL (A) 04/02/2021 2147   BILIRUBINUR NEGATIVE 04/02/2021 2147   BILIRUBINUR Negative 06/15/2014 0625   KETONESUR 5 (A) 04/02/2021 2147   PROTEINUR NEGATIVE 04/02/2021 2147   NITRITE NEGATIVE 04/02/2021 2147   LEUKOCYTESUR NEGATIVE 04/02/2021 2147   LEUKOCYTESUR Negative 06/15/2014 0625    Microbiology Recent Results (from the past 240 hour(s))  Resp Panel by RT-PCR (Flu A&B, Covid) Nasopharyngeal Swab     Status: Abnormal   Collection Time: 04/02/21  8:09 PM   Specimen: Nasopharyngeal Swab; Nasopharyngeal(NP) swabs in vial transport medium  Result Value Ref Range Status   SARS Coronavirus 2 by RT PCR POSITIVE (A) NEGATIVE Final    Comment: RESULT CALLED TO, READ BACK BY AND VERIFIED WITH: NOTIFIED KATHY PHELTS 04/02/2021 2200 JG (NOTE) SARS-CoV-2 target nucleic acids are DETECTED.  The SARS-CoV-2 RNA is generally detectable in upper respiratory specimens during the acute phase of infection. Positive results are indicative of the presence of the identified virus, but do not rule out bacterial infection or co-infection with other pathogens not detected by the test. Clinical correlation with patient history and other diagnostic information is necessary to determine patient infection status. The expected result is Negative.  Fact Sheet for Patients: EntrepreneurPulse.com.au  Fact Sheet for Healthcare Providers: IncredibleEmployment.be  This test is not yet approved or cleared by the [place] FDA and  has  been authorized for detection and/or diagnosis of SARS-CoV-2 by FDA under an Emergency Use Authorization (EUA).  This EUA will remain in effect (meaning this test  can be used) for the duration of  the COVID-19 declaration under Section 564(b)(1) of the Act, 21 U.S.C. section 360bbb-3(b)(1), unless the authorization is terminated or revoked sooner.     Influenza A by PCR NEGATIVE NEGATIVE Final   Influenza B by PCR NEGATIVE NEGATIVE Final    Comment: (NOTE) The Xpert Xpress SARS-CoV-2/FLU/RSV plus assay is intended as an aid in the diagnosis of influenza from Nasopharyngeal swab specimens and should not be used as a sole basis for treatment. Nasal washings and aspirates are unacceptable for Xpert Xpress SARS-CoV-2/FLU/RSV testing.  Fact Sheet for Patients: EntrepreneurPulse.com.au  Fact Sheet for Healthcare Providers: IncredibleEmployment.be  This test is not yet approved or cleared by the [place] FDA and has been authorized for detection and/or diagnosis of SARS-CoV-2 by FDA under an Emergency Use Authorization (EUA). This EUA will remain in effect (meaning this test can be used) for the duration of the COVID-19 declaration under Section 564(b)(1) of the Act, 21 U.S.C. section 360bbb-3(b)(1), unless the  authorization is terminated or revoked.  Performed at [facility], [address] [place], [place] [address]     Time coordinating discharge: Approximately 40 minutes   B , MD  [facility] 04/07/2021, 10:22 AM  

## 2021-04-07 NOTE — Discharge Instructions (Signed)
COVID-19 Quarantine vs. Isolation QUARANTINE keeps someone who was in close contact with someone who has COVID-19 away from others. Quarantine if you have been in close contact with someone who has COVID-19, unless you have been fully vaccinated. If you are fully vaccinated  You do NOT need to quarantine unless they have symptoms  Get tested 3-5 days after your exposure, even if you don't have symptoms  Wear a mask indoors in public for 14 days following exposure or until your test result is negative If you are not fully vaccinated  Stay home for 14 days after your last contact with a person who has COVID-19  Watch for fever (100.4F), cough, shortness of breath, or other symptoms of COVID-19  If possible, stay away from people you live with, especially people who are at higher risk for getting very sick from COVID-19  Contact your local public health department for options in your area to possibly shorten your quarantine ISOLATION keeps someone who is sick or tested positive for COVID-19 without symptoms away from others, even in their own home. People who are in isolation should stay home and stay in a specific "sick room" or area and use a separate bathroom (if available). If you are sick and think or know you have COVID-19 Stay home until after  At least 10 days since symptoms first appeared and  At least 24 hours with no fever without the use of fever-reducing medications and  Symptoms have improved If you tested positive for COVID-19 but do not have symptoms  Stay home until after 10 days have passed since your positive viral test  If you develop symptoms after testing positive, follow the steps above for those who are sick cdc.gov/[email or web address] 09/06/2020 This information is not intended to replace advice given to you by your health care provider. Make sure you discuss any questions you have with your health care provider. Document Revised: 10/11/2020 Document Reviewed:  10/11/2020 Elsevier Patient Education  2021 Elsevier Inc.  

## 2021-04-07 NOTE — Progress Notes (Addendum)
Inpatient Diabetes Program Recommendations  AACE/ADA: New Consensus Statement on Inpatient Glycemic Control   Target Ranges:  Prepandial:   less than 140 mg/dL      Peak postprandial:   less than 180 mg/dL (1-2 hours)      Critically ill patients:  140 - 180 mg/dL   Results for Cassandra Flynn, Cassandra Flynn (MRN [id number]) as of 04/07/2021 09:51  Ref. Range 04/06/2021 08:05 04/06/2021 11:54 04/06/2021 15:28 04/06/2021 21:27 04/07/2021 07:46  Glucose-Capillary Latest Ref Range: 70 - 99 mg/dL 817 (H) 711 (H) 657 (H) 260 (H) 229 (H)   Review of Glycemic Control  history:DM2 Outpatient Diabetes medications:Metformin 500 mg BID Current orders for Inpatient glycemic control:Lantus 50 units QHS, Novolog 18 units TID with meals, Novolog 0-15 units TID with meals, Novolog 0-5 units QHS  Inpatient Diabetes Program Recommendations:  Insulin: Noted Lantus increased from 38 to 50 units QHS yesterday. Please consider increasing Lantus to 55 units QHS and meal coverage to Novolog 20 units TID with meals.  HbgA1C:A1C 11.6% on 4/24/22indicating an average glucose of 284 mg/dl over the past 2-3 months.  Outpatient: Recommend to discharge on Lantus 55 units QHS, Novolog 20 units TID with meals, Novolog 0-15 units TID with meals for correction, and Metformin 1000 mg BID.  At time of discharge, please provide Rx for Lantus SoloStar [phone]), Novolog FlexPen [phone]), insulin pen needles [phone]), and Metformin.   Thanks, Orlando Penner, RN, MSN, CDE Diabetes Coordinator Inpatient Diabetes Program [phone] (Team Pager from 8am to 5pm)

## 2021-04-07 NOTE — Progress Notes (Signed)
Discharge paperwork reviewed with patient. Verbalizes understanding of discharge plan, new medications and follow up care. Demonstrated proper tecnique and knowledge of insulin administration and glucose testing. All questions answered.

## 2021-07-06 DIAGNOSIS — Y92410 Unspecified street and highway as the place of occurrence of the external cause: Secondary | ICD-10-CM | POA: Insufficient documentation

## 2021-07-06 DIAGNOSIS — J45909 Unspecified asthma, uncomplicated: Secondary | ICD-10-CM | POA: Diagnosis not present

## 2021-07-06 DIAGNOSIS — I10 Essential (primary) hypertension: Secondary | ICD-10-CM | POA: Diagnosis not present

## 2021-07-06 DIAGNOSIS — Z794 Long term (current) use of insulin: Secondary | ICD-10-CM | POA: Diagnosis not present

## 2021-07-06 DIAGNOSIS — E1110 Type 2 diabetes mellitus with ketoacidosis without coma: Secondary | ICD-10-CM | POA: Diagnosis not present

## 2021-07-06 DIAGNOSIS — F17210 Nicotine dependence, cigarettes, uncomplicated: Secondary | ICD-10-CM | POA: Insufficient documentation

## 2021-07-06 DIAGNOSIS — Z8616 Personal history of COVID-19: Secondary | ICD-10-CM | POA: Diagnosis not present

## 2021-07-06 DIAGNOSIS — Z7984 Long term (current) use of oral hypoglycemic drugs: Secondary | ICD-10-CM | POA: Diagnosis not present

## 2021-07-06 DIAGNOSIS — S8991XA Unspecified injury of right lower leg, initial encounter: Secondary | ICD-10-CM | POA: Insufficient documentation

## 2021-07-06 DIAGNOSIS — Y9301 Activity, walking, marching and hiking: Secondary | ICD-10-CM | POA: Diagnosis not present

## 2021-07-07 ENCOUNTER — Emergency Department
Admission: EM | Admit: 2021-07-07 | Discharge: 2021-07-07 | Disposition: A | Discharge status: Home / Self Care | Payer: Medicaid Other | Attending: Emergency Medicine | Admitting: Emergency Medicine

## 2021-07-07 ENCOUNTER — Emergency Department: Payer: Medicaid Other

## 2021-07-07 ENCOUNTER — Other Ambulatory Visit: Payer: Self-pay

## 2021-07-07 ENCOUNTER — Encounter: Payer: Self-pay | Admitting: Emergency Medicine

## 2021-07-07 DIAGNOSIS — S8991XA Unspecified injury of right lower leg, initial encounter: Secondary | ICD-10-CM

## 2021-07-07 MED ORDER — ONDANSETRON 4 MG PO TBDP
4.0000 mg | ORAL_TABLET | Freq: Once | ORAL | Status: AC
  Administered 2021-07-07: 4 mg via ORAL
  Filled 2021-07-07: qty 1

## 2021-07-07 MED ORDER — IBUPROFEN 800 MG PO TABS
800.0000 mg | ORAL_TABLET | Freq: Once | ORAL | Status: AC
  Administered 2021-07-07: 800 mg via ORAL
  Filled 2021-07-07: qty 1

## 2021-07-07 MED ORDER — ONDANSETRON 4 MG PO TBDP: 4.0000 mg | ORAL_TABLET | Freq: Once | ORAL | Status: DC

## 2021-07-07 MED ORDER — OXYCODONE-ACETAMINOPHEN 5-325 MG PO TABS: 1.0000 | ORAL_TABLET | Freq: Once | ORAL | Status: DC

## 2021-07-07 MED ORDER — HYDROCODONE-ACETAMINOPHEN 5-325 MG PO TABS
2.0000 | ORAL_TABLET | Freq: Once | ORAL | Status: AC
  Administered 2021-07-07: 2 via ORAL
  Filled 2021-07-07: qty 2
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
## 2021-07-07 NOTE — Discharge Instructions (Addendum)
You may alternate Tylenol 1000 mg every 6 hours as needed for pain, fever and Ibuprofen 800 mg every 8 hours as needed for pain, fever.  Please take Ibuprofen with food.  Do not take more than 4000 mg of Tylenol (acetaminophen) in a 24 hour period.  You may use crutches as needed to help get around for the next few days but you are safe to bear weight as tolerated.  Your x-ray showed no fracture or dislocation.  If your symptoms or not improving in the next week, recommend close follow-up with your primary care doctor.

## 2021-07-20 ENCOUNTER — Emergency Department
Admission: EM | Admit: 2021-07-20 | Discharge: 2021-07-20 | Disposition: A | Discharge status: Home / Self Care | Payer: Medicaid Other | Attending: Emergency Medicine | Admitting: Emergency Medicine

## 2021-07-20 ENCOUNTER — Encounter: Payer: Self-pay | Admitting: Emergency Medicine

## 2021-07-20 ENCOUNTER — Other Ambulatory Visit: Payer: Self-pay

## 2021-07-20 DIAGNOSIS — Z794 Long term (current) use of insulin: Secondary | ICD-10-CM | POA: Diagnosis not present

## 2021-07-20 DIAGNOSIS — N898 Other specified noninflammatory disorders of vagina: Secondary | ICD-10-CM

## 2021-07-20 DIAGNOSIS — R21 Rash and other nonspecific skin eruption: Secondary | ICD-10-CM | POA: Diagnosis present

## 2021-07-20 DIAGNOSIS — F17210 Nicotine dependence, cigarettes, uncomplicated: Secondary | ICD-10-CM | POA: Diagnosis not present

## 2021-07-20 DIAGNOSIS — Z7984 Long term (current) use of oral hypoglycemic drugs: Secondary | ICD-10-CM | POA: Insufficient documentation

## 2021-07-20 DIAGNOSIS — Z8616 Personal history of COVID-19: Secondary | ICD-10-CM | POA: Insufficient documentation

## 2021-07-20 DIAGNOSIS — A6000 Herpesviral infection of urogenital system, unspecified: Secondary | ICD-10-CM | POA: Diagnosis not present

## 2021-07-20 DIAGNOSIS — I10 Essential (primary) hypertension: Secondary | ICD-10-CM | POA: Diagnosis not present

## 2021-07-20 DIAGNOSIS — J45909 Unspecified asthma, uncomplicated: Secondary | ICD-10-CM | POA: Diagnosis not present

## 2021-07-20 DIAGNOSIS — E1110 Type 2 diabetes mellitus with ketoacidosis without coma: Secondary | ICD-10-CM | POA: Insufficient documentation

## 2021-07-20 LAB — WET PREP, GENITAL
Sperm: NONE SEEN
Trich, Wet Prep: NONE SEEN

## 2021-07-20 LAB — CHLAMYDIA/NGC RT PCR (ARMC ONLY)
Chlamydia Tr: NOT DETECTED
N gonorrhoeae: NOT DETECTED

## 2021-07-20 MED ORDER — FLUCONAZOLE 150 MG PO TABS: 150.0000 mg | ORAL_TABLET | Freq: Every day | ORAL | 0 refills | Status: AC

## 2021-07-20 MED ORDER — METRONIDAZOLE 500 MG PO TABS: 500.0000 mg | ORAL_TABLET | Freq: Two times a day (BID) | ORAL | 0 refills | Status: AC

## 2021-07-20 MED ORDER — VALACYCLOVIR HCL 1 G PO TABS: 1000.0000 mg | ORAL_TABLET | Freq: Two times a day (BID) | ORAL | 0 refills | Status: AC

## 2021-07-20 NOTE — Discharge Instructions (Addendum)
Take Valtrex twice daily for the next 10 days. Take Diflucan for yeast.  If you are still having vaginal itching, repeat Diflucan 1 week.

## 2021-07-20 NOTE — ED Triage Notes (Signed)
Pt comes into the ED via POV c/o rash and dysuria that has started after taking her first dose of Meloxicam.  Pt states she thinks it is an allergic reaction.  Pt ambulatory to triage at this time. Pt states the rash has small bumps and she treated at home by putting peroxide on it.

## 2021-07-20 NOTE — ED Notes (Signed)
Pt urinated all over self. Pt states that she went to sit down and did not realize she needed to go. Linen changed, gown given to pt.

## 2021-07-20 NOTE — ED Provider Notes (Signed)
[facility]-EMERGENCY DEPARTMENT  ____________________________________________  Time seen: Approximately 7:49 PM  I have reviewed the triage vital signs and the nursing notes.   HISTORY  Chief Complaint Rash and Dysuria   Historian Patient    HPI Cassandra Flynn is a 25 y.o. female presents to the emergency department with dysuria and burning vesicular rash along the labia.  Patient states that she has been in a monogamous relationship for the past 3 years and has not had similar symptoms in the past.  She also endorses vaginal itching.  No dyspareunia or pelvic pain.  No nausea, vomiting or low back pain.  No other alleviating measures have been attempted.   Past Medical History:  Diagnosis Date   Asthma    Diabetes mellitus without complication ([place])    Hypertension    Sleep apnea      Immunizations up to date:  Yes.     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 Authorizing Provider  albuterol (PROVENTIL HFA;VENTOLIN HFA) 108 (90  Base) MCG/ACT inhaler Inhale 2 puffs into the lungs every 6 (six) hours as needed for wheezing or shortness of breath. Patient not taking: Reported on 04/02/2021 08/11/17   Merlyn Lot, MD  blood glucose meter kit and supplies KIT Dispense based on patient and insurance preference. Use up to four times daily as directed. 04/07/21   Patrecia Pour, MD  dextromethorphan-guaiFENesin (MUCINEX DM) 30-600 MG 12hr tablet Take 1 tablet by mouth 2 (two) times daily. 04/07/21   Patrecia Pour, MD  fluconazole (DIFLUCAN) 150 MG tablet Take 1 tablet (150 mg total) by mouth daily for 1 day. 07/20/21 07/21/21 Yes Vallarie Mare M, PA-C  insulin aspart (NOVOLOG) 100 UNIT/ML FlexPen Inject 20-35 Units into the skin 3 (three) times daily with meals. 20 units with meals with additional sliding scale dose based on blood sugar (0-15 units) 04/07/21   Patrecia Pour, MD  insulin glargine (LANTUS SOLOSTAR) 100 UNIT/ML Solostar Pen Inject 55 Units into the skin daily. 04/07/21   Patrecia Pour, MD  Insulin Pen Needle 32G X 4 MM MISC Use to inject insulin 4 times daily as directed 04/07/21   Patrecia Pour, MD  metFORMIN (GLUCOPHAGE) 500 MG tablet Take 2 tablets (1,000 mg total) by mouth 2 (two) times daily with a meal. 04/07/21 05/07/21  Patrecia Pour, MD  norelgestromin-ethinyl estradiol (ORTHO EVRA) 150-35 MCG/24HR transdermal patch Place 1 patch onto the skin once a week. Reported on 02/14/2016 Patient not taking: Reported on  04/02/2021    [provider]  valACYclovir (VALTREX) 1000 MG tablet Take 1 tablet (1,000 mg total) by mouth 2 (two) times daily for 10 days. 07/20/21 07/30/21 Yes Vallarie Mare M, PA-C    Allergies Penicillins  History reviewed. No pertinent family history.  Social History Social History   Tobacco Use   Smoking status: Every Day    Types: Cigarettes   Smokeless tobacco: Never  Vaping Use   Vaping Use: Never used  Substance Use Topics   Alcohol use: No   Drug use: No     Review of Systems   Constitutional: No fever/chills Eyes:  No discharge ENT: No upper respiratory complaints. Respiratory: no cough. No SOB/ use of accessory muscles to breath Gastrointestinal:   No nausea, no vomiting.  No diarrhea.  No constipation. Musculoskeletal: Negative for musculoskeletal pain. Skin: Patient has vaginal rash.     ____________________________________________   PHYSICAL EXAM:  VITAL SIGNS: ED Triage Vitals  Enc Vitals Group     BP 07/20/21 1530 (!) 166/109     Pulse Rate 07/20/21 1530 (!) 110     Resp 07/20/21 1530 18     Temp 07/20/21 1530 99.1 F (37.3 C)     Temp Source 07/20/21 1530 Oral     SpO2 07/20/21 1530 98 %     Weight 07/20/21 1531 (!) 343 lb 14.7 oz (156 kg)     Height 07/20/21 1531 _0  (1.803 m)     Head Circumference --      Peak Flow --      Pain Score 07/20/21 1531 10     Pain Loc --      Pain Edu? --      Excl. in [place]? --      Constitutional: Alert and oriented. Well appearing and in no acute distress. Eyes: Conjunctivae are normal. PERRL. EOMI. Head: Atraumatic. ENT:      Nose: No congestion/rhinnorhea.      Mouth/Throat: Mucous membranes are moist.  Neck: No stridor.  No cervical spine tenderness to palpation. Cardiovascular: Normal rate, regular rhythm. Normal S1 and S2.  Good peripheral circulation. Respiratory: Normal respiratory effort without tachypnea or retractions. Lungs CTAB. Good air entry to the bases with no decreased or absent breath sounds Gastrointestinal: Bowel sounds x 4 quadrants. Soft and nontender to palpation. No guarding or rigidity. No distention. Genitourinary: Patient has vesicular, clustered, maculopapular rash along labia majora. Musculoskeletal: Full range of motion to all extremities. No obvious deformities noted Neurologic:  Normal for age. No gross focal neurologic deficits are appreciated.  Skin:  Skin is warm, dry and intact. No rash noted. Psychiatric: Mood and affect are normal for age. Speech and behavior  are normal.   ____________________________________________   LABS (all labs ordered are listed, but only abnormal results are displayed)  Labs Reviewed  WET PREP, GENITAL - Abnormal; Notable for the following components:      Result Value   Yeast Wet Prep HPF POC PRESENT (*)    Clue Cells Wet Prep HPF POC PRESENT (*)    WBC, Wet Prep HPF POC MANY (*)    All other components within normal limits  CHLAMYDIA/NGC RT PCR ([facility] ONLY)            URINALYSIS, COMPLETE (UACMP) WITH MICROSCOPIC  POC URINE PREG, ED   ____________________________________________  EKG   ____________________________________________  RADIOLOGY   No results found.  ____________________________________________    PROCEDURES  Procedure(s) performed:     Procedures  Medications - No data to display   ____________________________________________   INITIAL IMPRESSION / ASSESSMENT AND PLAN / ED COURSE  Pertinent labs & imaging results that were available during my care of the patient were reviewed by me and considered in my medical decision making (see chart for details).      Assessment and plan Genital herpes 25 year old female presents to the emergency department with vaginal rash and pruritus.  Patient was hypertensive and mildly tachycardic at triage but vital signs were otherwise reassuring.  Patient tested negative for gonorrhea and chlamydia.  Yeast was detected on wet prep as well as clue cells concerning for yeast vaginitis and BV.  Clinically, patient's rash was consistent with genital herpes.  Patient refused to produce a urine sample.  I offered to provide hydration in the emergency department or even catheterization and patient declined stating that she needed to go home.  She was discharged with Valtrex, Diflucan and Flagyl.  Return precautions were given to return with new or worsening symptoms.     ____________________________________________  FINAL CLINICAL  IMPRESSION(S) / ED DIAGNOSES  Final diagnoses:  Rash  Vaginal itching  Genital herpes simplex, unspecified site      NEW MEDICATIONS STARTED DURING THIS VISIT:  ED Discharge Orders          Ordered    fluconazole (DIFLUCAN) 150 MG tablet  Daily        07/20/21 1942    valACYclovir (VALTREX) 1000 MG tablet  2 times daily        07/20/21 1942                This chart was dictated using voice recognition software/Dragon. Despite best efforts to proofread, errors can occur which can change the meaning. Any change was purely unintentional.     Karren Cobble 07/20/21 Marguarite Arbour, MD 07/24/21 [phone]

## 2022-11-23 ENCOUNTER — Emergency Department: Payer: Medicaid Other

## 2022-11-23 ENCOUNTER — Emergency Department
Admission: EM | Admit: 2022-11-23 | Discharge: 2022-11-23 | Disposition: A | Discharge status: Home / Self Care | Payer: Medicaid Other | Attending: Emergency Medicine | Admitting: Emergency Medicine

## 2022-11-23 ENCOUNTER — Other Ambulatory Visit: Payer: Self-pay

## 2022-11-23 DIAGNOSIS — E119 Type 2 diabetes mellitus without complications: Secondary | ICD-10-CM | POA: Insufficient documentation

## 2022-11-23 DIAGNOSIS — J45909 Unspecified asthma, uncomplicated: Secondary | ICD-10-CM | POA: Insufficient documentation

## 2022-11-23 DIAGNOSIS — Z8616 Personal history of COVID-19: Secondary | ICD-10-CM | POA: Insufficient documentation

## 2022-11-23 DIAGNOSIS — J101 Influenza due to other identified influenza virus with other respiratory manifestations: Secondary | ICD-10-CM | POA: Insufficient documentation

## 2022-11-23 DIAGNOSIS — I10 Essential (primary) hypertension: Secondary | ICD-10-CM | POA: Diagnosis not present

## 2022-11-23 DIAGNOSIS — R0602 Shortness of breath: Secondary | ICD-10-CM | POA: Diagnosis present

## 2022-11-23 DIAGNOSIS — Z1152 Encounter for screening for COVID-19: Secondary | ICD-10-CM | POA: Diagnosis not present

## 2022-11-23 LAB — RESP PANEL BY RT-PCR (RSV, FLU A&B, COVID)  RVPGX2
Influenza A by PCR: POSITIVE — AB
Influenza B by PCR: NEGATIVE
Resp Syncytial Virus by PCR: NEGATIVE
SARS Coronavirus 2 by RT PCR: NEGATIVE

## 2022-11-23 LAB — GROUP A STREP BY PCR: Group A Strep by PCR: NOT DETECTED

## 2022-11-23 NOTE — ED Triage Notes (Signed)
First RN:   Pt BIB [facility]- Pt coming from home with shortness of breath and a headache for 2 days. Hx of asthma as per EMS  Vitals for EMS 146/92 BP 106 HR 21 Etco2 291 cgb 98.42f temp/oral  20G, R AC

## 2022-11-23 NOTE — ED Provider Triage Note (Signed)
Emergency Medicine Provider Triage Evaluation Note  Cassandra Flynn , a 26 y.o. female  was evaluated in triage.  Pt complains of shortness of breath, exposed to people respiratory symptoms.  Review of Systems  Positive:  Negative:   Physical Exam  BP (!) 118/94 (BP Location: Left Arm)   Pulse (!) 104   Temp 97.9 F (36.6 C) (Oral)   Resp 20   Ht 5\' 11"  (1.803 m)   Wt (!) 146.5 kg   LMP 11/06/2022 (Approximate)   SpO2 100%   BMI 45.05 kg/m  Gen:   Awake, no distress   Resp:  Normal effort  MSK:   Moves extremities without difficulty  Other:    Medical Decision Making  Medically screening exam initiated at 12:06 PM.  Appropriate orders placed.  11/08/2022 was informed that the remainder of the evaluation will be completed by another provider, this initial triage assessment does not replace that evaluation, and the importance of remaining in the ED until their evaluation is complete.  Patient's vitals are normal.  EMS who put her on oxygen and we have removed her and she is stable at 100% on room air   [name], PA-C 11/23/22 1206

## 2022-11-23 NOTE — ED Provider Notes (Signed)
[facility] Provider Note    Event Date/Time   First MD Initiated Contact with Patient 11/23/22 1215     (approximate)   History   Chief Complaint Shortness of Breath   HPI  Cassandra Flynn is a 26 y.o. female with past medical history of hypertension, diabetes, asthma, and PTSD who presents to the ED complaining of shortness of breath.  Patient reports that she has had about 3 days of nonproductive cough, weakness, malaise, and difficulty breathing.  She denies any fevers but states she was exposed to her daughter who recently tested positive for strep throat and flu.  Patient denies any nausea, vomiting, or diarrhea.  She has been eating and drinking normally.      Physical Exam   Triage Vital Signs: ED Triage Vitals  Enc Vitals Group     BP 11/23/22 1159 (!) 118/94     Pulse Rate 11/23/22 1159 (!) 104     Resp 11/23/22 1159 20     Temp 11/23/22 1159 97.9 F (36.6 C)     Temp Source 11/23/22 1159 Oral     SpO2 11/23/22 1159 100 %     Weight 11/23/22 1201 (!) 323 lb (146.5 kg)     Height 11/23/22 1201 5\' 11"  (1.803 m)     Head Circumference --      Peak Flow --      Pain Score 11/23/22 1201 4     Pain Loc --      Pain Edu? --      Excl. in [place]? --     Most recent vital signs: Vitals:   11/23/22 1159  BP: (!) 118/94  Pulse: (!) 104  Resp: 20  Temp: 97.9 F (36.6 C)  SpO2: 100%    Constitutional: Alert and oriented. Eyes: Conjunctivae are normal. Head: Atraumatic. Nose: No congestion/rhinnorhea. Mouth/Throat: Mucous membranes are moist.  Cardiovascular: Normal rate, regular rhythm. Grossly normal heart sounds.  2+ radial pulses bilaterally. Respiratory: Normal respiratory effort.  No retractions. Lungs CTAB. Gastrointestinal: Soft and nontender. No distention. Musculoskeletal: No lower extremity tenderness nor edema.  Neurologic:  Normal speech and language. No gross focal neurologic deficits are appreciated.    ED Results  / Procedures / Treatments   Labs (all labs ordered are listed, but only abnormal results are displayed) Labs Reviewed  RESP PANEL BY RT-PCR (RSV, FLU A&B, COVID)  RVPGX2 - Abnormal; Notable for the following components:      Result Value   Influenza A by PCR POSITIVE (*)    All other components within normal limits  GROUP A STREP BY PCR     EKG  ED ECG REPORT I, Blake Divine, the attending physician, personally viewed and interpreted this ECG.   Date: 11/23/2022  EKG Time: 12:49  Rate: 96  Rhythm: normal sinus rhythm  Axis: Normal  Intervals:none  ST&T Change: None  RADIOLOGY Chest x-ray reviewed and interpreted by me with no infiltrate, edema, or effusion.  PROCEDURES:  Critical Care performed: No  Procedures   MEDICATIONS ORDERED IN ED: Medications - No data to display   IMPRESSION / MDM / [place] / ED COURSE  I reviewed the triage vital signs and the nursing notes.                              26 y.o. female with past medical history of hypertension, diabetes, asthma, and PTSD who presents  to the ED with 3 days of cough, congestion, shortness of breath, malaise, and bodyaches.  Patient's presentation is most consistent with acute complicated illness / injury requiring diagnostic workup.  Differential diagnosis includes, but is not limited to, influenza, COVID-19, bronchitis, asthma exacerbation, pneumonia, strep pharyngitis.  Patient nontoxic-appearing and in no acute distress, vital signs are unremarkable.  She is breathing comfortably on room air here in the ED, lungs are clear to auscultation bilaterally.  Chest x-ray is unremarkable, no findings concerning for pneumonia.  EKG shows no evidence of arrhythmia or ischemia.  Testing is positive for influenza, which is likely the source of her symptoms.  No signs of asthma exacerbation at this time and patient is appropriate for outpatient management.  She was counseled to follow-up with her PCP and  to return to the ED for new or worsening symptoms, patient agrees with plan.      FINAL CLINICAL IMPRESSION(S) / ED DIAGNOSES   Final diagnoses:  Influenza A     Rx / DC Orders   ED Discharge Orders     None        Note:  This document was prepared using Dragon voice recognition software and may include unintentional dictation errors.   Chesley Noon, MD 11/23/22 1324

## 2023-10-19 ENCOUNTER — Other Ambulatory Visit: Payer: Self-pay

## 2023-10-19 ENCOUNTER — Emergency Department
Admission: EM | Admit: 2023-10-19 | Discharge: 2023-10-20 | Disposition: A | Discharge status: Home / Self Care | Payer: MEDICAID | Attending: Emergency Medicine | Admitting: Emergency Medicine

## 2023-10-19 DIAGNOSIS — S82202A Unspecified fracture of shaft of left tibia, initial encounter for closed fracture: Secondary | ICD-10-CM | POA: Diagnosis not present

## 2023-10-19 DIAGNOSIS — S82402A Unspecified fracture of shaft of left fibula, initial encounter for closed fracture: Secondary | ICD-10-CM | POA: Diagnosis not present

## 2023-10-19 DIAGNOSIS — S82302A Unspecified fracture of lower end of left tibia, initial encounter for closed fracture: Secondary | ICD-10-CM

## 2023-10-19 DIAGNOSIS — S82832A Other fracture of upper and lower end of left fibula, initial encounter for closed fracture: Secondary | ICD-10-CM

## 2023-10-19 DIAGNOSIS — Y9301 Activity, walking, marching and hiking: Secondary | ICD-10-CM | POA: Diagnosis not present

## 2023-10-19 DIAGNOSIS — W010XXA Fall on same level from slipping, tripping and stumbling without subsequent striking against object, initial encounter: Secondary | ICD-10-CM | POA: Insufficient documentation

## 2023-10-19 DIAGNOSIS — S99912A Unspecified injury of left ankle, initial encounter: Secondary | ICD-10-CM | POA: Diagnosis present

## 2023-10-19 DIAGNOSIS — M7989 Other specified soft tissue disorders: Secondary | ICD-10-CM | POA: Diagnosis not present

## 2023-10-19 MED ORDER — FENTANYL CITRATE PF 50 MCG/ML IJ SOSY
100.0000 ug | PREFILLED_SYRINGE | Freq: Once | INTRAMUSCULAR | Status: AC
  Administered 2023-10-19: 100 ug via INTRAVENOUS
  Filled 2023-10-19: qty 2

## 2023-10-20 ENCOUNTER — Other Ambulatory Visit: Payer: MEDICAID

## 2023-10-20 ENCOUNTER — Emergency Department: Payer: MEDICAID

## 2023-10-20 MED ORDER — ONDANSETRON HCL 4 MG/2ML IJ SOLN
4.0000 mg | Freq: Once | INTRAMUSCULAR | Status: AC
  Administered 2023-10-20: 4 mg via INTRAVENOUS
  Filled 2023-10-20: qty 2

## 2023-10-20 MED ORDER — HYDROMORPHONE HCL 1 MG/ML IJ SOLN
0.5000 mg | Freq: Once | INTRAMUSCULAR | Status: AC
  Administered 2023-10-20: 0.5 mg via INTRAVENOUS
  Filled 2023-10-20: qty 0.5

## 2023-10-20 MED ORDER — HYDROCODONE-ACETAMINOPHEN 5-325 MG PO TABS: 1.0000 | ORAL_TABLET | Freq: Four times a day (QID) | ORAL | 0 refills | Status: DC | PRN

## 2023-10-20 MED ORDER — FENTANYL CITRATE PF 50 MCG/ML IJ SOSY
100.0000 ug | PREFILLED_SYRINGE | Freq: Once | INTRAMUSCULAR | Status: AC
  Administered 2023-10-20: 100 ug via INTRAVENOUS
  Filled 2023-10-20: qty 2

## 2023-10-20 MED ORDER — FENTANYL CITRATE PF 50 MCG/ML IJ SOSY
50.0000 ug | PREFILLED_SYRINGE | Freq: Once | INTRAMUSCULAR | Status: AC
  Administered 2023-10-20: 50 ug via INTRAVENOUS
  Filled 2023-10-20: qty 1

## 2023-10-20 MED ORDER — ONDANSETRON HCL 4 MG PO TABS: 4.0000 mg | ORAL_TABLET | Freq: Four times a day (QID) | ORAL | 0 refills | Status: DC | PRN

## 2023-10-20 MED ORDER — PROPOFOL 10 MG/ML IV BOLUS
INTRAVENOUS | Status: AC
  Administered 2023-10-20: 20 mg via INTRAVENOUS
  Filled 2023-10-20: qty 20

## 2023-10-20 MED ORDER — PROPOFOL 10 MG/ML IV BOLUS
INTRAVENOUS | Status: AC
  Administered 2023-10-20 (×2): 50 mg via INTRAVENOUS
  Filled 2023-10-20: qty 20

## 2023-10-20 MED ORDER — PROPOFOL 10 MG/ML IV BOLUS
1.0000 mg/kg | Freq: Once | INTRAVENOUS | Status: AC
  Administered 2023-10-20: 50 mg via INTRAVENOUS
  Administered 2023-10-20: 100 mg via INTRAVENOUS
  Administered 2023-10-20 (×3): 50 mg via INTRAVENOUS
  Administered 2023-10-20: 80 mg via INTRAVENOUS
  Filled 2023-10-20: qty 20

## 2023-10-20 NOTE — ED Provider Notes (Signed)
[facility] Provider Note    Event Date/Time   First MD Initiated Contact with Patient 10/19/23 2332     (approximate)   History   Ankle Pain   HPI  Donella Anzivino is a 27 year old female presenting to the emergency department for evaluation after a fall.  Patient was walking outside when she tripped, and felt a popping sound in her leg.  She collapsed onto the ground, but did not hit her head.  No LOC.  Immediate pain in her leg.    Physical Exam   Triage Vital Signs: ED Triage Vitals  Encounter Vitals Group     BP 10/19/23 2338 132/86     Systolic BP Percentile --      Diastolic BP Percentile --      Pulse Rate 10/19/23 2338 (!) 102     Resp 10/19/23 2338 18     Temp 10/19/23 2338 98 F (36.7 C)     Temp Source 10/19/23 2338 Oral     SpO2 10/19/23 2334 100 %     Weight 10/19/23 2336 (!) 352 lb 4.7 oz (159.8 kg)     Height --      Head Circumference --      Peak Flow --      Pain Score 10/19/23 2336 10     Pain Loc --      Pain Education --      Exclude from Growth Chart --     Most recent vital signs: Vitals:   10/20/23 0315 10/20/23 0320  BP: (!) 166/79   Pulse: 86   Resp: 20   Temp:  98.1 F (36.7 C)  SpO2: 98%     Nursing notes and vital signs reviewed.  General: Adult female, laying in bed, awake, appears uncomfortable Head: Atraumatic Chest: Symmetric chest rise, no tenderness to palpation.  Cardiac: Regular rhythm and rate.  Respiratory: Lungs clear to auscultation Abdomen: Soft, nondistended. No tenderness to palpation.  Pelvis: Stable in AP and lateral compression. No tenderness to palpation. MSK: Obvious deformity of the left lower leg without open areas of skin.  No other areas of tenderness or deformity of the bilateral upper and lower extremities.  Intact DP pulses bilaterally. Neuro: Alert, oriented. GCS 15.sensation intact throughout the distal lower extremity.   Skin: No evidence of burns or  lacerations. ED Results / Procedures / Treatments   Labs (all labs ordered are listed, but only abnormal results are displayed) Labs Reviewed - No data to display   EKG EKG independently reviewed interpreted by myself (ER attending) demonstrates:    RADIOLOGY Imaging independently reviewed and interpreted by myself demonstrates:  X-Asma Boldon of the ankle demonstrates acute fractures of the distal left tibia and left fibula No additional fractures noted on x-Alyshia Kernan of the foot and tibia and fibula Postreduction x-Crystalynn Mcinerney of the ankle demonstrates improved alignment of tibia and fibula fractures  PROCEDURES:  Critical Care performed: No  Reduction of fracture  Date/Time: 10/20/2023 4:23 AM  Performed by: Trinna Post, MD Authorized by: Trinna Post, MD  Consent: Written consent obtained. Risks and benefits: risks, benefits and alternatives were discussed Consent given by: patient  Sedation: Patient sedated: yes  Patient tolerance: patient tolerated the procedure well with no immediate complications Comments: Sedation performed by Dr. Katrinka Blazing, please see his separate note for further details.  After patient was sedated, traction was applied to reduce deformity of patient's distal tibia and fibula.  Postreduction x-Jeremy Ditullio demonstrated improved anatomic alignment.  Patient was splinted in a short leg posterior splint with stirrup.   Marland KitchenSplint Application  Date/Time: 10/20/2023 4:24 AM  Performed by: Trinna Post, MD Authorized by: Trinna Post, MD   Consent:    Consent obtained:  Written   Consent given by:  Patient Pre-procedure details:    Distal neurologic exam:  Normal   Distal perfusion: brisk capillary refill   Procedure details:    Location:  Ankle   Ankle location:  L ankle   Splint type:  Short leg   Supplies:  Fiberglass   Attestation: Splint applied and adjusted personally by me   Post-procedure details:    Distal neurologic exam:  Unchanged   Procedure completion:   Tolerated    MEDICATIONS ORDERED IN ED: Medications  fentaNYL (SUBLIMAZE) injection 100 mcg (100 mcg Intravenous Given 10/19/23 2345)  HYDROmorphone (DILAUDID) injection 0.5 mg (0.5 mg Intravenous Given 10/20/23 0023)  ondansetron (ZOFRAN) injection 4 mg (4 mg Intravenous Given 10/20/23 0023)  propofol (DIPRIVAN) 10 mg/mL bolus/IV push 159.8 mg (50 mg Intravenous Given 10/20/23 0156)  fentaNYL (SUBLIMAZE) injection 100 mcg (100 mcg Intravenous Given 10/20/23 0206)  fentaNYL (SUBLIMAZE) injection 50 mcg (50 mcg Intravenous Given 10/20/23 0354)     IMPRESSION / MDM / ASSESSMENT AND PLAN / ED COURSE  I reviewed the triage vital signs and the nursing notes.  Differential diagnosis includes, but is not limited to, ankle fracture, distal tibia fracture, soft tissue injury, dislocation, no evidence of neurovascular injury  Patient's presentation is most consistent with acute presentation with potential threat to life or bodily function.  27 year old female presenting to the emergency department for evaluation after a fall with obvious deformity of the lower extremity.  X-rays demonstrated fractures of the tibia and fibula with angulation of the tibia fracture.  She was given pain control.  Patient's fracture was reduced as above under moderate sedation with propofol.  Patient did return to preanesthesia level of consciousness.  He was able to ambulate with crutches.  Case was reviewed with Dr. Rosann Auerbach with orthopedics.  He did recommend a posterior short leg splint with medial to lateral stirrup which was placed.  He did feel that patient was appropriate for discharge with outpatient follow-up with [facility].  Discussed this with patient who is comfortable this plan.  Will DC with prescription for pain and nausea medication.     FINAL CLINICAL IMPRESSION(S) / ED DIAGNOSES   Final diagnoses:  Closed fracture of distal end of left tibia, unspecified fracture morphology, initial encounter  Closed  fracture of distal end of left fibula, unspecified fracture morphology, initial encounter     Rx / DC Orders   ED Discharge Orders          Ordered    HYDROcodone-acetaminophen (NORCO) 5-325 MG tablet  Every 6 hours PRN        10/20/23 0431    ondansetron (ZOFRAN) 4 MG tablet  Every 6 hours PRN        10/20/23 0431             Note:  This document was prepared using Dragon voice recognition software and may include unintentional dictation errors.   Trinna Post, MD 10/20/23 [phone]

## 2023-10-20 NOTE — Sedation Documentation (Signed)
Pt arousable post procedure/ pt drowsy but will wake up with touch stimuli

## 2023-10-20 NOTE — ED Notes (Signed)
Pt successfully walked to bedside commode with crutched with no assistance/ MD made aware

## 2023-10-20 NOTE — Discharge Instructions (Addendum)
You were seen in the emergency room today for evaluation after your fall.  You do have fractures of both bones in your lower leg called the tibia and fibula.  These fractures were reduced and you were placed in a splint.  Please keep the splint in place and clean and dry until you are able to arrange follow-up with orthopedics.  Do not place weight on your left leg.  You can use the crutches to get around.  Take Tylenol and ibuprofen to help with your pain.  If you have breakthrough pain, a narcotic medicine to your pharmacy.  Do not drive or operate machinery when taking this medication as it can make you drowsy.  I also sent nausea medicine that you can use as needed.  Return to the ER for new or worsening symptoms.

## 2023-10-20 NOTE — ED Notes (Signed)
Left foot cadillac splint placed by Dr. Rosalia Hammers and Herbert Seta, RN/ Dr. Katrinka Blazing at bedside for assistance

## 2023-10-20 NOTE — ED Provider Notes (Signed)
I assisted Dr. Rosalia Hammers about performing procedural sedation with propofol to help facilitate fracture reduction of the distal tibia fx and splinting.  Total of 500 mg of propofol used IV with good effect.  .Sedation  Date/Time: 10/20/2023 2:11 AM  Performed by: Delton Prairie, MD Authorized by: Delton Prairie, MD   Consent:    Consent obtained:  Verbal   Consent given by:  Patient Universal protocol:    Immediately prior to procedure, a time out was called: yes   Indications:    Procedure performed:  Fracture reduction   Procedure necessitating sedation performed by:  Different physician Pre-sedation assessment:    Time since last food or drink:  4hrs   ASA classification: class 2 - patient with mild systemic disease     Mallampati score:  II - soft palate, uvula, fauces visible   Pre-sedation assessments completed and reviewed: pre-procedure airway patency not reviewed   Immediate pre-procedure details:    Reassessment: Patient reassessed immediately prior to procedure     Reviewed: vital signs, relevant labs/tests and NPO status     Verified: bag valve mask available, emergency equipment available, intubation equipment available, IV patency confirmed, oxygen available, reversal medications available and suction available   Procedure details (see MAR for exact dosages):    Preoxygenation:  Nasal cannula   Sedation:  Propofol   Intended level of sedation: deep   Analgesia:  Hydromorphone   Intra-procedure monitoring:  Blood pressure monitoring, cardiac monitor, frequent LOC assessments, continuous capnometry, continuous pulse oximetry and frequent vital sign checks   Intra-procedure events: none     Total Provider sedation time (minutes):  15 Post-procedure details:    Attendance: Constant attendance by certified staff until patient recovered     Recovery: Patient returned to pre-procedure baseline     Patient is stable for discharge or admission: yes     Procedure completion:  Tolerated  well, no immediate complications     Delton Prairie, MD 10/20/23 0214

## 2023-10-20 NOTE — ED Notes (Signed)
Pt walked with crutches to bedside commode with no issues

## 2023-10-21 ENCOUNTER — Emergency Department: Payer: MEDICAID

## 2023-10-21 ENCOUNTER — Other Ambulatory Visit: Payer: Self-pay

## 2023-10-21 ENCOUNTER — Inpatient Hospital Stay
Admission: EM | Admit: 2023-10-21 | Discharge: 2023-10-25 | DRG: 493 | Disposition: A | Discharge status: Home / Self Care | Payer: MEDICAID | Attending: Internal Medicine | Admitting: Internal Medicine

## 2023-10-21 DIAGNOSIS — Z794 Long term (current) use of insulin: Secondary | ICD-10-CM

## 2023-10-21 DIAGNOSIS — S99912A Unspecified injury of left ankle, initial encounter: Secondary | ICD-10-CM | POA: Diagnosis present

## 2023-10-21 DIAGNOSIS — Z7984 Long term (current) use of oral hypoglycemic drugs: Secondary | ICD-10-CM | POA: Diagnosis not present

## 2023-10-21 DIAGNOSIS — I10 Essential (primary) hypertension: Secondary | ICD-10-CM | POA: Diagnosis present

## 2023-10-21 DIAGNOSIS — Z880 Allergy status to penicillin: Secondary | ICD-10-CM

## 2023-10-21 DIAGNOSIS — Z6841 Body Mass Index (BMI) 40.0 and over, adult: Secondary | ICD-10-CM

## 2023-10-21 DIAGNOSIS — E119 Type 2 diabetes mellitus without complications: Secondary | ICD-10-CM

## 2023-10-21 DIAGNOSIS — Z8616 Personal history of COVID-19: Secondary | ICD-10-CM

## 2023-10-21 DIAGNOSIS — G4733 Obstructive sleep apnea (adult) (pediatric): Secondary | ICD-10-CM | POA: Diagnosis present

## 2023-10-21 DIAGNOSIS — S82302A Unspecified fracture of lower end of left tibia, initial encounter for closed fracture: Secondary | ICD-10-CM | POA: Diagnosis present

## 2023-10-21 DIAGNOSIS — S82402A Unspecified fracture of shaft of left fibula, initial encounter for closed fracture: Secondary | ICD-10-CM | POA: Diagnosis not present

## 2023-10-21 DIAGNOSIS — J45909 Unspecified asthma, uncomplicated: Secondary | ICD-10-CM | POA: Diagnosis present

## 2023-10-21 DIAGNOSIS — Z9049 Acquired absence of other specified parts of digestive tract: Secondary | ICD-10-CM | POA: Diagnosis not present

## 2023-10-21 DIAGNOSIS — S82202A Unspecified fracture of shaft of left tibia, initial encounter for closed fracture: Secondary | ICD-10-CM | POA: Diagnosis not present

## 2023-10-21 DIAGNOSIS — F17210 Nicotine dependence, cigarettes, uncomplicated: Secondary | ICD-10-CM | POA: Diagnosis present

## 2023-10-21 DIAGNOSIS — E1165 Type 2 diabetes mellitus with hyperglycemia: Secondary | ICD-10-CM | POA: Diagnosis present

## 2023-10-21 DIAGNOSIS — F3164 Bipolar disorder, current episode mixed, severe, with psychotic features: Secondary | ICD-10-CM | POA: Diagnosis not present

## 2023-10-21 DIAGNOSIS — W010XXA Fall on same level from slipping, tripping and stumbling without subsequent striking against object, initial encounter: Secondary | ICD-10-CM | POA: Diagnosis present

## 2023-10-21 DIAGNOSIS — S82832A Other fracture of upper and lower end of left fibula, initial encounter for closed fracture: Secondary | ICD-10-CM | POA: Diagnosis present

## 2023-10-21 DIAGNOSIS — Y9301 Activity, walking, marching and hiking: Secondary | ICD-10-CM | POA: Diagnosis present

## 2023-10-21 DIAGNOSIS — W1830XA Fall on same level, unspecified, initial encounter: Secondary | ICD-10-CM | POA: Diagnosis not present

## 2023-10-21 LAB — CBC
HCT: 41.1 % (ref 36.0–46.0)
Hemoglobin: 14.2 g/dL (ref 12.0–15.0)
MCH: 30.1 pg (ref 26.0–34.0)
MCHC: 34.5 g/dL (ref 30.0–36.0)
MCV: 87.3 fL (ref 80.0–100.0)
Platelets: 282 10*3/uL (ref 150–400)
RBC: 4.71 MIL/uL (ref 3.87–5.11)
RDW: 12 % (ref 11.5–15.5)
WBC: 12.2 10*3/uL — ABNORMAL HIGH (ref 4.0–10.5)
nRBC: 0 % (ref 0.0–0.2)

## 2023-10-21 LAB — BASIC METABOLIC PANEL
Anion gap: 13 (ref 5–15)
BUN: 11 mg/dL (ref 6–20)
CO2: 20 mmol/L — ABNORMAL LOW (ref 22–32)
Calcium: 9 mg/dL (ref 8.9–10.3)
Chloride: 99 mmol/L (ref 98–111)
Creatinine, Ser: 0.93 mg/dL (ref 0.44–1.00)
GFR, Estimated: >60 mL/min (ref >60)
Glucose, Bld: 324 mg/dL — ABNORMAL HIGH (ref 70–99)
Potassium: 4.4 mmol/L (ref 3.5–5.1)
Sodium: 132 mmol/L — ABNORMAL LOW (ref 135–145)

## 2023-10-21 LAB — CK: Total CK: 204 U/L (ref 38–234)

## 2023-10-21 LAB — PREGNANCY, URINE: Preg Test, Ur: NEGATIVE

## 2023-10-21 MED ORDER — HYDROCODONE-ACETAMINOPHEN 5-325 MG PO TABS
1.0000 | ORAL_TABLET | ORAL | Status: DC | PRN
  Administered 2023-10-22: 2 via ORAL
  Filled 2023-10-21: qty 2

## 2023-10-21 MED ORDER — CEFAZOLIN (ANCEF) 1 G IV SOLR: 2.0000 g | INTRAVENOUS | Status: DC

## 2023-10-21 MED ORDER — ONDANSETRON HCL 4 MG/2ML IJ SOLN
4.0000 mg | INTRAMUSCULAR | Status: AC
  Administered 2023-10-21: 4 mg via INTRAVENOUS
  Filled 2023-10-21: qty 2

## 2023-10-21 MED ORDER — HYDROMORPHONE HCL 1 MG/ML IJ SOLN
1.0000 mg | INTRAMUSCULAR | Status: AC
  Administered 2023-10-21: 1 mg via INTRAVENOUS
  Filled 2023-10-21: qty 1

## 2023-10-21 MED ORDER — CHLORHEXIDINE GLUCONATE CLOTH 2 % EX PADS
6.0000 | MEDICATED_PAD | Freq: Once | CUTANEOUS | Status: AC
  Administered 2023-10-22: 6 via TOPICAL

## 2023-10-21 MED ORDER — ONDANSETRON 4 MG PO TBDP: 4.0000 mg | ORAL_TABLET | Freq: Four times a day (QID) | ORAL | Status: DC | PRN

## 2023-10-21 MED ORDER — DIPHENHYDRAMINE HCL 25 MG PO CAPS
25.0000 mg | ORAL_CAPSULE | Freq: Four times a day (QID) | ORAL | Status: DC | PRN
  Administered 2023-10-21: 25 mg via ORAL
  Filled 2023-10-21: qty 1

## 2023-10-21 MED ORDER — SODIUM CHLORIDE 0.9 % IV BOLUS
1000.0000 mL | Freq: Once | INTRAVENOUS | Status: AC
  Administered 2023-10-21: 1000 mL via INTRAVENOUS

## 2023-10-21 MED ORDER — CEFAZOLIN SODIUM-DEXTROSE 2-4 GM/100ML-% IV SOLN
2.0000 g | Freq: Once | INTRAVENOUS | Status: AC
  Administered 2023-10-21: 2 g via INTRAVENOUS
  Filled 2023-10-21 (×2): qty 100

## 2023-10-21 MED ORDER — MORPHINE SULFATE (PF) 2 MG/ML IV SOLN
0.5000 mg | INTRAVENOUS | Status: DC | PRN
Start: 2023-10-21 — End: 2023-10-25
  Administered 2023-10-22 – 2023-10-25 (×5): 1 mg via INTRAVENOUS
  Filled 2023-10-21 (×5): qty 1

## 2023-10-21 MED ORDER — HYDROCODONE-ACETAMINOPHEN 7.5-325 MG PO TABS
1.0000 | ORAL_TABLET | ORAL | Status: DC | PRN
Start: 2023-10-21 — End: 2023-10-23
  Administered 2023-10-21: 1 via ORAL
  Administered 2023-10-23 (×2): 2 via ORAL
  Filled 2023-10-21: qty 1
  Filled 2023-10-21 (×2): qty 2

## 2023-10-21 MED ORDER — ACETAMINOPHEN 500 MG PO TABS: 500.0000 mg | ORAL_TABLET | Freq: Four times a day (QID) | ORAL | Status: DC

## 2023-10-21 MED ORDER — HEPARIN SODIUM (PORCINE) 5000 UNIT/ML IJ SOLN
5000.0000 [IU] | Freq: Three times a day (TID) | INTRAMUSCULAR | Status: DC
  Administered 2023-10-21 – 2023-10-23 (×4): 5000 [IU] via SUBCUTANEOUS
  Filled 2023-10-21 (×4): qty 1

## 2023-10-21 NOTE — ED Triage Notes (Addendum)
Pt in via [facility] c/o leg pain. Had a tib-fib fx on the 8th. Had surgery on the 9th. 2hrs ago pain became unbearable. Stabbing, burning pain in left leg. Able to feel and wiggle her toes. Pt states her leg is tightening up. Per EMS pt has been feeling her leg popping.  Meds given per EMS: of fentanyl--pt states pain meds did nothing to relieve pain.   EMS Vitals: Hr -135 O2-98%  BP-120/51 CO2-22 RR- 25

## 2023-10-21 NOTE — ED Notes (Addendum)
Pt states pain is a 10/10 in the left leg. Pt tearful but pleasant in Rm.

## 2023-10-21 NOTE — H&P (Signed)
ORTHOPEDIC HISTORY AND PHYSICAL EXAMINATION  Cassandra Flynn [id number]  10/21/2023  CC:  Chief Complaint  Patient presents with   Leg Pain    History of Present IlIness: The patient is a 27 y.o. female who presented to the emergency department at approximately 2 AM this morning after she was walking on some leaves and slipped twisting her left ankle.  She had immediate onset of ankle pain and was transported to the local department where she was evaluated and found to have a distal left tibia and fibula fracture which was closed.  I was consulted by telephone by the emergency department provider to discuss closed reduction and splinting, evaluation for compartment syndrome, and neurovascular status.  The patient was stable and discharged home.  She Cassandra Flynn presented at approximately 2:00 this afternoon complaining of increasing left leg pain.  She was again evaluated by the emergency department provider with repeat radiographs and a clinical evaluation for compartment syndrome and neurovascular status.  Orthopedics was consulted and I evaluated the patient.  Further questioning reveals that she has very little support system at home to care for her and she has a pain threshold that cannot be managed with outpatient medication.  The patient is being admitted for close follow-up of any developing compartment syndrome and pain management.  PMH:  Past Medical History:  Diagnosis Date   Asthma    Diabetes mellitus without complication (HCC)    Hypertension    Sleep apnea     SH:  Past Surgical History:  Procedure Laterality Date   CESAREAN SECTION     CHOLECYSTECTOMY     SKIN GRAFT     TONSILLECTOMY      ALL:  Allergies  Allergen Reactions   Penicillins Itching, Swelling and Rash    Has patient had a PCN reaction causing immediate rash, facial/tongue/throat swelling, SOB or lightheadedness with hypotension: Unknown Has patient had a PCN reaction causing severe rash involving mucus  membranes or skin necrosis: Unknown Has patient had a PCN reaction that required hospitalization: Unknown Has patient had a PCN reaction occurring within the last 10 years: Unknown If all of the above answers are "NO", then may proceed with Cephalosporin use.     MED:   All home medications have been reviewed as documented in the medication reconciliation portion of the patient record.  FH: History reviewed. No pertinent family history.  Social:  reports that she has been smoking cigarettes. She has never used smokeless tobacco. She reports that she does not drink alcohol and does not use drugs.  Review of Systems: General: Denies fever, chills, weight loss Eyes: Denies blurry vision, changes in vision ENT: Denies sore throat, congestions, nosebleeds CV: Denies chest pain, palpitations Respiratory: Denies shortness of breath, wheezing, cough Gl: Denies abdominal pain, nausea, vomiting GU: Denies hematuria Integumentary: Denies rashes or lesions Neuro: Denies headache, dizziness Psych: Negative Hem/Onc: Denies easy bruising or bleeding disorders Musculoskeletal: See HPI above.  Vitals: BP 122/78   Pulse 65   Temp 98.6 F (37 C) (Oral)   Resp 16   Ht 5\' 11"  (1.803 m)   Wt (!) 159.8 kg   SpO2 93%   BMI 49.14 kg/m    Physical Exam: General: Awake, alert and oriented, no acute distress. Eyes: Pupils reactive, EOMI, normal conjunctiva, no scleral icterus. HENT: Normocephalic, atraumatic, normal hearing, moist oral mucosa Neck: Supple, non-tender, no cervical lymphadenopathy. Lungs: Chest rise is symmetric, non-labored respiration, chest wall nontender to palpation Heart: Normal rate by palpation, normal  peripheral perfusion Abdomen: Soft, non-tender, non-distended. Pelvis is stable. Skin: Skin envelope intact, dry and pink, no rashes or lesions, no signs of infection. Neurologic: Awake, alert, and oriented X3 Psychiatric: Cooperative, appropriate mood and  affect. Musculoskeletal: Evaluation of the patient's symptomatic left lower extremity after the splint has been split down the front reveals mild swelling and early contusion developing over the distal tibia region.  The compartments of her calf and foot are soft.  Dorsalis pedis and posterior tibial pulse are 2+ and symmetric.  Her toes are pink and warm with a brisk capillary refill time.  The leg is appropriately warm and certainly not cool to the touch.  The patient is able to gingerly move her toes on command.  She is neurologically intact over all dermatomal patterns of the left lower extremity.  The patient has a full pain-free range of motion in her bilateral upper extremities and her right lower extremity.  She has no neck or back tenderness.   Radiographic findings: Radiographs from the previous emergency department closed reduction and the current radiographs of the patient's left tibia/fibular region were independently evaluated on the PACS system.  The patient has a distal tibia spiral fracture and mild displacement and a relatively transverse distal fibula fracture at the level of the plafond.  The mortise is well reduced.  The tibiotalar joints well reduced.  No gas is noted in the subcutaneous tissue.  Soft tissue envelope is abundant.   Labs:  Recent Labs    10/21/23 1405  HGB 14.2   Recent Labs    10/21/23 1405  WBC 12.2*  RBC 4.71  HCT 41.1  PLT 282   Recent Labs    10/21/23 1405  NA 132*  K 4.4  CL 99  CO2 20*  BUN 11  CREATININE 0.93  GLUCOSE 324*  CALCIUM 9.0   No results for input(s): "LABPT", "INR" in the last 72 hours.  Assessment/Plan:    Assessment: 27 year old female status post ground-level fall with closed neurovascularly intact left distal tibia/fibular fracture status post reduction in the emergency department.  No clinical signs of compartment syndrome or significant neurologic damage.  Poor home support system in place.   Plan: I  discussed with the patient that I see no evidence of compartment syndrome but certainly her pain is doing better now that her splint has been loosened and she has been given pain medication but I am concerned that she does not have any care at home and she is a high risk for falls based on her body habitus and upper body strength.  We discussed being admitted for close evaluation for any impending compartment syndrome as well as pain management.  We discussed that we may be able to proceed with placement of a left intramedullary nail for tibia fracture and open reduction/internal fixation of her left distal fibular fracture within the next 24 hours if she is medically stable.  We discussed that before and after surgery the patient will remain nonweightbearing on the left lower extremity.  She works at [employer] and understands that she will have work restrictions and would not be able to return to work full weightbearing for approximately 3-4 months to reach maximal medical improvement.  The patient has been extensively counseled on the potential benefits versus risks of surgery including but not limited to postoperative nausea, wound healing issues, infection, blood clots, breakage or loosening of any metallic implants or anchors, limb length inequality, dislocation, and possible need for future revision  surgery. We also discussed the significant medical risks of stroke, myocardial infarction, anesthetic complications, or exacerbation of their diabetes mellitus. After discussing the multiple pros and cons, they would like to proceed with surgery as discussed. Their consent was reviewed and signed and placed in the chart.  I anticipate that this patient will be admitted more than 2 midnights for further evaluation and possible surgery during this hospitalization.   Cecil Cranker M.D. 10/21/2023 4:04 PM

## 2023-10-21 NOTE — ED Provider Notes (Signed)
[facility] Provider Note    Event Date/Time   First MD Initiated Contact with Patient 10/21/23 1354     (approximate)   History   Leg Pain   HPI  Cassandra Flynn is a 27 y.o. female who suffered a left ankle fracture yesterday.  Patient reports that she has had some steady worsening severe pain in the left ankle.  Pain is extremely severe, 10 out of 10.  No fevers or chills.  No other injuries.  She has taken hydrocodone, reports is providing no relief at all.  She can still feel the toes of her foot and wiggle them on the left.  No other injury has occurred.  Pain is severe it is located at the left lower leg left ankle region.     Physical Exam   Triage Vital Signs: ED Triage Vitals  Encounter Vitals Group     BP 10/21/23 1358 (!) 144/94     Systolic BP Percentile --      Diastolic BP Percentile --      Pulse Rate 10/21/23 1358 (!) 123     Resp 10/21/23 1358 18     Temp 10/21/23 1358 98.6 F (37 C)     Temp Source 10/21/23 1358 Oral     SpO2 10/21/23 1358 100 %     Weight 10/21/23 1355 (!) 352 lb 4.7 oz (159.8 kg)     Height 10/21/23 1355 5\' 11"  (1.803 m)     Head Circumference --      Peak Flow --      Pain Score 10/21/23 1352 10     Pain Loc --      Pain Education --      Exclude from Growth Chart --     Most recent vital signs: Vitals:   10/21/23 1500 10/21/23 1717  BP: 122/78 (!) 142/87  Pulse: 65 97  Resp: 16 16  Temp:  97.7 F (36.5 C)  SpO2: 93% 100%     General: Awake, in obvious painful distress, heard screaming on room entry, she asked peers very uncomfortable tachycardic screaming in severe pain in the left foot.  Distress is noted.  No respiratory distress she is fully oriented but appears in severe pain CV:  Good peripheral perfusion.  Dopplerable left dorsalis pedis with warm well-perfused toes of the left foot.  No evidence of acute vascular compromise. Resp:  Normal effort.  Abd:  No distention.   Other:  Left lower extremity splint is taken down, the left posterior portion of the splint is left in place but the lateral no clamshells of the long-leg splint are taken off.  There is no lesion.  There is swelling moderately around the left ankle joint region and the left distal tib-fib.  There is no open lesion.  Compartments appear relatively soft without any obvious firmness to suggest a severe or developing compartment syndrome.  She does advise that she does not have sensation over the great toe of the left foot, she is able to feel the remaining toes.  She is able to wiggle all toes appropriately.  Range of motion testing of the left ankle is not undertaken   ED Results / Procedures / Treatments   Labs (all labs ordered are listed, but only abnormal results are displayed) Labs Reviewed  CBC - Abnormal; Notable for the following components:      Result Value   WBC 12.2 (*)    All other components within normal  limits  BASIC METABOLIC PANEL - Abnormal; Notable for the following components:   Sodium 132 (*)    CO2 20 (*)    Glucose, Bld 324 (*)    All other components within normal limits  CK  PREGNANCY, URINE  HIV ANTIBODY (ROUTINE TESTING W REFLEX)     EKG     RADIOLOGY  Left ankle x-ray observed by/interpreted by me, questionably slightly more displaced than it was yesterday post reduction.  DG Ankle Left Port  Result Date: 10/21/2023 CLINICAL DATA:  Recent fracture.  Increased pain. EXAM: PORTABLE LEFT ANKLE - 2 VIEW COMPARISON:  Left ankle radiographs dated October 20, 2023. FINDINGS: Again seen is an oblique fracture of the distal tibial diaphysis with approximately 11 mm of medial displacement of the distal fracture segment, compared to 8 mm previously, as well as 7 mm of anterior displacement, which is new since the prior exam. There is approximately 10 degrees of apex anterior angulation, which is new since the prior exam. Similar comminuted fracture of the left  distal fibula with a proximally 5 mm of medial displacement of the distal fragment. No additional fracture identified. No evidence of dislocation. Diffuse soft tissue of the visualized lower extremity. IMPRESSION: 1. Increased anteromedial displacement and new mild anterior angulation of the oblique distal tibial fracture. 2. Similar comminuted mildly displaced fracture of the left distal fibula. Electronically Signed   By: Hart Robinsons M.D.   On: 10/21/2023 16:11      PROCEDURES:  Critical Care performed: No  Procedures   MEDICATIONS ORDERED IN ED: Medications  heparin injection 5,000 Units (has no administration in time range)  HYDROcodone-acetaminophen (NORCO/VICODIN) 5-325 MG per tablet 1-2 tablet (has no administration in time range)  HYDROcodone-acetaminophen (NORCO) 7.5-325 MG per tablet 1-2 tablet (has no administration in time range)  morphine (PF) 2 MG/ML injection 0.5-1 mg (has no administration in time range)  ondansetron (ZOFRAN-ODT) disintegrating tablet 4 mg (has no administration in time range)  diphenhydrAMINE (BENADRYL) capsule 25 mg (25 mg Oral Given 10/21/23 1852)  HYDROmorphone (DILAUDID) injection 1 mg (1 mg Intravenous Given 10/21/23 1421)  ondansetron (ZOFRAN) injection 4 mg (4 mg Intravenous Given 10/21/23 1421)  HYDROmorphone (DILAUDID) injection 1 mg (1 mg Intravenous Given 10/21/23 1443)  sodium chloride 0.9 % bolus 1,000 mL (1,000 mLs Intravenous New Bag/Given 10/21/23 1448)  ceFAZolin (ANCEF) IVPB 2g/100 mL premix (0 g Intravenous Stopped 10/21/23 1825)     IMPRESSION / MDM / ASSESSMENT AND PLAN / ED COURSE  I reviewed the triage vital signs and the nursing notes.                              Differential diagnosis includes, but is not limited to, complication of fracture, rule out compartment syndrome, intractable pain, fracture dislodgment, tenderness or soft tissue injury infection etc.  Clinical examination does not yield any acute vascular  compromise there is some neurologic numbness over the left foot great toe only.  She still demonstrates normal motor strength though.  There is swelling consistent with injury.  Compartments do not appear to have an obvious compartment syndrome or solidarity.  Patient's presentation is most consistent with acute presentation with potential threat to life or bodily function.   The patient is on the cardiac monitor to evaluate for evidence of arrhythmia and/or significant heart rate changes.     ----------------------------------------- 3:27 PM on 10/21/2023 ----------------------------------------- Dr. Rosann Auerbach (ortho) at bedside for consult.  Orthopedics advises plan to admit the patient for observation, fracture management and pain control.  Discussed with the patient at approximately 4 PM she is resting much more comfortably.  She is agreeable with the plan for admission as recommended by orthopedics.  Her pain is much better controlled after hydromorphones.  She appears appropriate for further care treatment with orthopedics.  Nurse, Verdon Cummins, and I applied splint to left lower extremity including long-leg.  Ace wrap.  Patient tolerated very well.  Neurovascular intact normal capillary refill good toe wiggle with exception to numbness over the left great toe which was present on arrival.     FINAL CLINICAL IMPRESSION(S) / ED DIAGNOSES   Final diagnoses:  Fracture tibia/fibula, left, closed, subsequent     Rx / DC Orders   ED Discharge Orders     None        Note:  This document was prepared using Dragon voice recognition software and may include unintentional dictation errors.   Sharyn Creamer, MD 10/21/23 [phone]

## 2023-10-22 ENCOUNTER — Encounter
Admission: EM | Disposition: A | Discharge status: Home / Self Care | Payer: Self-pay | Source: Home / Self Care | Attending: Orthopedic Surgery

## 2023-10-22 ENCOUNTER — Inpatient Hospital Stay: Payer: MEDICAID | Admitting: Anesthesiology

## 2023-10-22 ENCOUNTER — Inpatient Hospital Stay: Payer: MEDICAID

## 2023-10-22 ENCOUNTER — Encounter: Payer: Self-pay | Admitting: Orthopedic Surgery

## 2023-10-22 ENCOUNTER — Other Ambulatory Visit: Payer: Self-pay

## 2023-10-22 DIAGNOSIS — E119 Type 2 diabetes mellitus without complications: Secondary | ICD-10-CM

## 2023-10-22 DIAGNOSIS — G4733 Obstructive sleep apnea (adult) (pediatric): Secondary | ICD-10-CM | POA: Diagnosis present

## 2023-10-22 DIAGNOSIS — S82202A Unspecified fracture of shaft of left tibia, initial encounter for closed fracture: Secondary | ICD-10-CM | POA: Diagnosis not present

## 2023-10-22 DIAGNOSIS — S82402A Unspecified fracture of shaft of left fibula, initial encounter for closed fracture: Secondary | ICD-10-CM

## 2023-10-22 DIAGNOSIS — S82302A Unspecified fracture of lower end of left tibia, initial encounter for closed fracture: Secondary | ICD-10-CM

## 2023-10-22 HISTORY — PX: TIBIA IM NAIL INSERTION: SHX2516

## 2023-10-22 HISTORY — PX: ORIF FIBULA FRACTURE: SHX5114

## 2023-10-22 LAB — SURGICAL PCR SCREEN
MRSA, PCR: NEGATIVE
Staphylococcus aureus: NEGATIVE

## 2023-10-22 LAB — GLUCOSE, CAPILLARY
Glucose-Capillary: 270 mg/dL — ABNORMAL HIGH (ref 70–99)
Glucose-Capillary: 356 mg/dL — ABNORMAL HIGH (ref 70–99)
Glucose-Capillary: 373 mg/dL — ABNORMAL HIGH (ref 70–99)
Glucose-Capillary: 282 mg/dL — ABNORMAL HIGH (ref 70–99)

## 2023-10-22 LAB — HEPATIC FUNCTION PANEL
ALT: 54 U/L — ABNORMAL HIGH (ref 0–44)
AST: 115 U/L — ABNORMAL HIGH (ref 15–41)
Albumin: 3.4 g/dL — ABNORMAL LOW (ref 3.5–5.0)
Alkaline Phosphatase: 90 U/L (ref 38–126)
Bilirubin, Direct: <0.1 mg/dL (ref 0.0–0.2)
Total Bilirubin: 0.6 mg/dL (ref <1.2)
Total Protein: 7.2 g/dL (ref 6.5–8.1)

## 2023-10-22 LAB — HIV ANTIBODY (ROUTINE TESTING W REFLEX): HIV Screen 4th Generation wRfx: NONREACTIVE

## 2023-10-22 SURGERY — INSERTION, INTRAMEDULLARY ROD, TIBIA
Anesthesia: General | Laterality: Left

## 2023-10-22 MED ORDER — GLYCOPYRROLATE 0.2 MG/ML IJ SOLN
INTRAMUSCULAR | Status: DC | PRN
  Administered 2023-10-22: .2 mg via INTRAVENOUS

## 2023-10-22 MED ORDER — SUCCINYLCHOLINE CHLORIDE 200 MG/10ML IV SOSY
PREFILLED_SYRINGE | INTRAVENOUS | Status: DC | PRN
  Administered 2023-10-22: 140 mg via INTRAVENOUS

## 2023-10-22 MED ORDER — PROPOFOL 10 MG/ML IV BOLUS
INTRAVENOUS | Status: DC | PRN
  Administered 2023-10-22 (×2): 50 mg via INTRAVENOUS
  Administered 2023-10-22: 200 mg via INTRAVENOUS
  Administered 2023-10-22: 100 mg via INTRAVENOUS

## 2023-10-22 MED ORDER — PROPOFOL 10 MG/ML IV BOLUS
INTRAVENOUS | Status: AC
Start: 2023-10-22 — End: ?
  Filled 2023-10-22: qty 20

## 2023-10-22 MED ORDER — BUPIVACAINE LIPOSOME 1.3 % IJ SUSP
INTRAMUSCULAR | Status: DC | PRN
  Administered 2023-10-22 (×2): 10 mL

## 2023-10-22 MED ORDER — DROPERIDOL 2.5 MG/ML IJ SOLN
0.6250 mg | Freq: Once | INTRAMUSCULAR | Status: AC
  Administered 2023-10-22: 0.625 mg via INTRAVENOUS

## 2023-10-22 MED ORDER — DEXMEDETOMIDINE HCL IN NACL 80 MCG/20ML IV SOLN
INTRAVENOUS | Status: DC | PRN
  Administered 2023-10-22 (×2): 12 ug via INTRAVENOUS

## 2023-10-22 MED ORDER — LIDOCAINE HCL (CARDIAC) PF 100 MG/5ML IV SOSY
PREFILLED_SYRINGE | INTRAVENOUS | Status: DC | PRN
  Administered 2023-10-22: 100 mg via INTRAVENOUS

## 2023-10-22 MED ORDER — ACETAMINOPHEN 10 MG/ML IV SOLN
INTRAVENOUS | Status: DC | PRN
  Administered 2023-10-22: 1000 mg via INTRAVENOUS

## 2023-10-22 MED ORDER — ROCURONIUM BROMIDE 100 MG/10ML IV SOLN
INTRAVENOUS | Status: DC | PRN
  Administered 2023-10-22: 30 mg via INTRAVENOUS
  Administered 2023-10-22 (×2): 20 mg via INTRAVENOUS

## 2023-10-22 MED ORDER — PHENYLEPHRINE 80 MCG/ML (10ML) SYRINGE FOR IV PUSH (FOR BLOOD PRESSURE SUPPORT)
PREFILLED_SYRINGE | INTRAVENOUS | Status: AC
Start: 2023-10-22 — End: ?
  Filled 2023-10-22: qty 10

## 2023-10-22 MED ORDER — FENTANYL CITRATE (PF) 100 MCG/2ML IJ SOLN
INTRAMUSCULAR | Status: AC
  Filled 2023-10-22: qty 2

## 2023-10-22 MED ORDER — PHENYLEPHRINE 80 MCG/ML (10ML) SYRINGE FOR IV PUSH (FOR BLOOD PRESSURE SUPPORT)
PREFILLED_SYRINGE | INTRAVENOUS | Status: DC | PRN
Start: 2023-10-22 — End: 2023-10-22
  Administered 2023-10-22: 80 ug via INTRAVENOUS

## 2023-10-22 MED ORDER — INSULIN ASPART 100 UNIT/ML IJ SOLN
11.0000 [IU] | Freq: Once | INTRAMUSCULAR | Status: AC
  Administered 2023-10-22: 11 [IU] via SUBCUTANEOUS

## 2023-10-22 MED ORDER — MIDAZOLAM HCL 2 MG/2ML IJ SOLN
INTRAMUSCULAR | Status: AC
Start: 2023-10-22 — End: ?
  Filled 2023-10-22: qty 2

## 2023-10-22 MED ORDER — FENTANYL CITRATE PF 50 MCG/ML IJ SOSY
50.0000 ug | PREFILLED_SYRINGE | INTRAMUSCULAR | Status: DC | PRN
  Administered 2023-10-22: 50 ug via INTRAVENOUS

## 2023-10-22 MED ORDER — BUPIVACAINE HCL (PF) 0.5 % IJ SOLN
INTRAMUSCULAR | Status: AC
  Filled 2023-10-22: qty 20

## 2023-10-22 MED ORDER — LIDOCAINE HCL (PF) 2 % IJ SOLN
INTRAMUSCULAR | Status: AC
  Filled 2023-10-22: qty 5

## 2023-10-22 MED ORDER — CLINDAMYCIN PHOSPHATE 600 MG/50ML IV SOLN
600.0000 mg | Freq: Once | INTRAVENOUS | Status: AC
  Administered 2023-10-22: 600 mg via INTRAVENOUS

## 2023-10-22 MED ORDER — OXYCODONE HCL 5 MG/5ML PO SOLN: 5.0000 mg | Freq: Once | ORAL | Status: DC | PRN

## 2023-10-22 MED ORDER — HYDROMORPHONE HCL 1 MG/ML IJ SOLN: 0.2500 mg | INTRAMUSCULAR | Status: DC | PRN

## 2023-10-22 MED ORDER — INSULIN ASPART 100 UNIT/ML IJ SOLN
8.0000 [IU] | Freq: Once | INTRAMUSCULAR | Status: AC
  Administered 2023-10-22: 8 [IU] via SUBCUTANEOUS

## 2023-10-22 MED ORDER — SUGAMMADEX SODIUM 200 MG/2ML IV SOLN
INTRAVENOUS | Status: DC | PRN
  Administered 2023-10-22: 200 mg via INTRAVENOUS

## 2023-10-22 MED ORDER — LIDOCAINE HCL (PF) 1 % IJ SOLN
INTRAMUSCULAR | Status: AC
  Filled 2023-10-22: qty 5

## 2023-10-22 MED ORDER — SUCCINYLCHOLINE CHLORIDE 200 MG/10ML IV SOSY
PREFILLED_SYRINGE | INTRAVENOUS | Status: AC
  Filled 2023-10-22: qty 10

## 2023-10-22 MED ORDER — INSULIN ASPART 100 UNIT/ML IJ SOLN
0.0000 [IU] | Freq: Three times a day (TID) | INTRAMUSCULAR | Status: DC
  Administered 2023-10-23 (×3): 8 [IU] via SUBCUTANEOUS
  Administered 2023-10-24: 5 [IU] via SUBCUTANEOUS
  Administered 2023-10-24 (×2): 8 [IU] via SUBCUTANEOUS
  Administered 2023-10-25: 11 [IU] via SUBCUTANEOUS
  Administered 2023-10-25: 5 [IU] via SUBCUTANEOUS
  Filled 2023-10-22 (×8): qty 1

## 2023-10-22 MED ORDER — ONDANSETRON HCL 4 MG/2ML IJ SOLN
INTRAMUSCULAR | Status: AC
  Filled 2023-10-22: qty 2

## 2023-10-22 MED ORDER — INSULIN ASPART 100 UNIT/ML IJ SOLN
INTRAMUSCULAR | Status: AC
  Filled 2023-10-22: qty 1

## 2023-10-22 MED ORDER — MIDAZOLAM HCL 2 MG/2ML IJ SOLN
1.0000 mg | INTRAMUSCULAR | Status: AC | PRN
Start: 2023-10-22 — End: 2023-10-22
  Administered 2023-10-22 (×2): 1 mg via INTRAVENOUS

## 2023-10-22 MED ORDER — FENTANYL CITRATE PF 50 MCG/ML IJ SOSY
PREFILLED_SYRINGE | INTRAMUSCULAR | Status: AC
  Filled 2023-10-22: qty 2

## 2023-10-22 MED ORDER — DEXAMETHASONE SODIUM PHOSPHATE 10 MG/ML IJ SOLN
INTRAMUSCULAR | Status: DC | PRN
  Administered 2023-10-22: 10 mg via INTRAVENOUS

## 2023-10-22 MED ORDER — OXYCODONE HCL 5 MG PO TABS: 5.0000 mg | ORAL_TABLET | Freq: Once | ORAL | Status: DC | PRN

## 2023-10-22 MED ORDER — GLYCOPYRROLATE 0.2 MG/ML IJ SOLN
INTRAMUSCULAR | Status: AC
  Filled 2023-10-22: qty 1

## 2023-10-22 MED ORDER — SODIUM CHLORIDE 0.9 % IV SOLN: INTRAVENOUS | Status: DC | PRN

## 2023-10-22 MED ORDER — DROPERIDOL 2.5 MG/ML IJ SOLN
INTRAMUSCULAR | Status: AC
  Filled 2023-10-22: qty 2

## 2023-10-22 MED ORDER — DEXAMETHASONE SODIUM PHOSPHATE 10 MG/ML IJ SOLN
INTRAMUSCULAR | Status: AC
Start: 2023-10-22 — End: ?
  Filled 2023-10-22: qty 1

## 2023-10-22 MED ORDER — ONDANSETRON HCL 4 MG/2ML IJ SOLN
INTRAMUSCULAR | Status: DC | PRN
  Administered 2023-10-22: 4 mg via INTRAVENOUS

## 2023-10-22 MED ORDER — ACETAMINOPHEN 10 MG/ML IV SOLN
INTRAVENOUS | Status: AC
  Filled 2023-10-22: qty 100

## 2023-10-22 MED ORDER — BUPIVACAINE LIPOSOME 1.3 % IJ SUSP
INTRAMUSCULAR | Status: AC
  Filled 2023-10-22: qty 20

## 2023-10-22 MED ORDER — BUPIVACAINE HCL (PF) 0.5 % IJ SOLN
INTRAMUSCULAR | Status: AC
  Filled 2023-10-22: qty 30

## 2023-10-22 MED ORDER — CLINDAMYCIN PHOSPHATE 600 MG/50ML IV SOLN
INTRAVENOUS | Status: AC
  Filled 2023-10-22: qty 50

## 2023-10-22 MED ORDER — LIDOCAINE HCL (PF) 1 % IJ SOLN
INTRAMUSCULAR | Status: DC | PRN
  Administered 2023-10-22: 2 mL
  Administered 2023-10-22: 3 mL

## 2023-10-22 MED ORDER — FENTANYL CITRATE (PF) 100 MCG/2ML IJ SOLN
INTRAMUSCULAR | Status: DC | PRN
  Administered 2023-10-22 (×6): 50 ug via INTRAVENOUS

## 2023-10-22 MED ORDER — BUPIVACAINE HCL (PF) 0.5 % IJ SOLN
INTRAMUSCULAR | Status: DC | PRN
  Administered 2023-10-22 (×2): 10 mL

## 2023-10-22 SURGICAL SUPPLY — 72 items
APL PRP STRL LF DISP 70% ISPRP (MISCELLANEOUS) ×2
BIT DRILL 2.5 X LONG (BIT) ×2
BIT DRILL FLEXIBLE LONG 12 (BIT) IMPLANT
BIT DRILL LCP QC 2X140 (BIT) IMPLANT
BIT DRILL LONG 4.2 (BIT) IMPLANT
BIT DRILL QC 2.5X135 (BIT) IMPLANT
BIT DRILL SHORT 4.2 (BIT) IMPLANT
BIT DRILL X LONG 2.5 (BIT) IMPLANT
BLADE SURG 15 STRL LF DISP TIS (BLADE) IMPLANT
BLADE SURG 15 STRL SS (BLADE) ×6
BLADE SURG SZ10 CARB STEEL (BLADE) ×4 IMPLANT
BNDG CMPR 5X4 KNIT ELC UNQ LF (GAUZE/BANDAGES/DRESSINGS) ×2
BNDG CMPR 6 X 5 YARDS HK CLSR (GAUZE/BANDAGES/DRESSINGS) ×2
BNDG ELASTIC 4INX 5YD STR LF (GAUZE/BANDAGES/DRESSINGS) ×2 IMPLANT
BNDG ELASTIC 6INX 5YD STR LF (GAUZE/BANDAGES/DRESSINGS) ×2 IMPLANT
BNDG ESMARCH 6 X 12 STRL LF (GAUZE/BANDAGES/DRESSINGS) ×2
BNDG ESMARCH 6X12 STRL LF (GAUZE/BANDAGES/DRESSINGS) ×2 IMPLANT
BNDG PLASTER FAST 4X5 WHT LF (CAST SUPPLIES) ×4 IMPLANT
BNDG PLASTER FAST 6X5 WHT LF (CAST SUPPLIES) ×4 IMPLANT
BNDG PLSTR 5X4 FST ST WHT LF (CAST SUPPLIES) ×4
BNDG PLSTR 5X6 FST ST WHT LF (CAST SUPPLIES) ×4
CHLORAPREP W/TINT 26 (MISCELLANEOUS) ×2 IMPLANT
CUFF TOURN SGL QUICK 42 (TOURNIQUET CUFF) IMPLANT
DRAPE C-ARM XRAY 36X54 (DRAPES) ×2 IMPLANT
DRAPE C-ARMOR (DRAPES) ×2 IMPLANT
DRAPE INCISE IOBAN 66X60 STRL (DRAPES) IMPLANT
DRAPE SHEET LG 3/4 BI-LAMINATE (DRAPES) ×4 IMPLANT
DRAPE TABLE BACK 80X90 (DRAPES) ×2 IMPLANT
DRILL BIT SHORT 4.2 (BIT) ×2
DRILL BIT X LONG 2.5 (BIT) ×2
ELECT CAUTERY BLADE 6.4 (BLADE) ×2 IMPLANT
ELECT REM PT RETURN 9FT ADLT (ELECTROSURGICAL) ×2
ELECTRODE REM PT RTRN 9FT ADLT (ELECTROSURGICAL) ×2 IMPLANT
GAUZE SPONGE 4X4 12PLY STRL (GAUZE/BANDAGES/DRESSINGS) ×2 IMPLANT
GAUZE XEROFORM 1X8 LF (GAUZE/BANDAGES/DRESSINGS) ×2 IMPLANT
GLOVE BIO SURGEON STRL SZ8 (GLOVE) ×4 IMPLANT
GLOVE BIOGEL PI IND STRL 8 (GLOVE) ×2 IMPLANT
GLOVE INDICATOR 8.0 STRL GRN (GLOVE) ×2 IMPLANT
GOWN STRL REUS W/ TWL LRG LVL3 (GOWN DISPOSABLE) ×2 IMPLANT
GOWN STRL REUS W/ TWL XL LVL3 (GOWN DISPOSABLE) ×2 IMPLANT
GOWN STRL REUS W/TWL LRG LVL3 (GOWN DISPOSABLE) ×2
GOWN STRL REUS W/TWL XL LVL3 (GOWN DISPOSABLE) ×2
GUIDEWIRE 3.2X400 (WIRE) IMPLANT
HANDLE YANKAUER SUCT BULB TIP (MISCELLANEOUS) ×2 IMPLANT
K-WIRE 1.6X150 (WIRE) ×2
KIT TURNOVER KIT A (KITS) ×2 IMPLANT
KWIRE 1.6X150 (WIRE) IMPLANT
MANIFOLD NEPTUNE II (INSTRUMENTS) ×2 IMPLANT
NAIL TIB TFNA STRL 9X360 (Nail) IMPLANT
NS IRRIG 1000ML POUR BTL (IV SOLUTION) ×2 IMPLANT
PACK EXTREMITY ARMC (MISCELLANEOUS) ×2 IMPLANT
PAD ABD DERMACEA PRESS 5X9 (GAUZE/BANDAGES/DRESSINGS) ×4 IMPLANT
PADDING CAST BLEND 4X4 NS (MISCELLANEOUS) ×6 IMPLANT
PLATE 3HOLE DISTAL FIB 2.7 ANK (Plate) IMPLANT
REAMER ROD 3.8 BALL TIP 3X950 (ORTHOPEDIC DISPOSABLE SUPPLIES) IMPLANT
SCREW CORT LP ST 3.5X16 (Screw) IMPLANT
SCREW CORT T15 12X3.5XST (Screw) IMPLANT
SCREW CORTEX 3.5X12MM (Screw) ×2 IMPLANT
SCREW LOCK IM 5X36 (Screw) ×2 IMPLANT
SCREW LOCK IM 5X50 (Screw) IMPLANT
SCREW LOCK IM 5X62 (Screw) IMPLANT
SCREW LOCK IM NAIL 5X34 (Screw) IMPLANT
SCREW LOCK VA ST 2.7X18 (Screw) IMPLANT
SCREW LOCK X25 36X5X IM NL (Screw) IMPLANT
SPONGE T-LAP 18X18 ~~LOC~~+RFID (SPONGE) ×2 IMPLANT
STAPLER SKIN PROX 35W (STAPLE) ×2 IMPLANT
STOCKINETTE M/LG 89821 (MISCELLANEOUS) IMPLANT
STRAP SAFETY 5IN WIDE (MISCELLANEOUS) ×2 IMPLANT
SUT VIC AB 0 CT1 36 (SUTURE) ×2 IMPLANT
SUT VICRYL AB 3-0 FS1 BRD 27IN (SUTURE) IMPLANT
TRAP FLUID SMOKE EVACUATOR (MISCELLANEOUS) ×2 IMPLANT
WATER STERILE IRR 500ML POUR (IV SOLUTION) ×2 IMPLANT

## 2023-10-22 NOTE — Inpatient Diabetes Management (Signed)
Inpatient Diabetes Program Recommendations  AACE/ADA: New Consensus Statement on Inpatient Glycemic Control (2015)  Target Ranges:  Prepandial:   less than 140 mg/dL      Peak postprandial:   less than 180 mg/dL (1-2 hours)      Critically ill patients:  140 - 180 mg/dL   Lab Results  Component Value Date   GLUCAP 270 (H) 10/22/2023   HGBA1C 11.6 (H) 04/03/2021    Review of Glycemic Control  Diabetes history: DM 2 Outpatient Diabetes medications: Lantus 55 units Daily, Metformin 1000 mg bid, Novolog 20 units tid + SSI Current orders for Inpatient glycemic control:  None  Needs updated A1c level  Inpatient Diabetes Program Recommendations:    -   Add Semglee 30 units -   Novolog 0-15 units tid + hs -   Novolog 4 units tid meal coverage if eating >50% of meals  Thanks,  Christena Deem RN, MSN, BC-ADM Inpatient Diabetes Coordinator Team Pager [phone] (8a-5p)

## 2023-10-22 NOTE — Assessment & Plan Note (Addendum)
Poorly controlled.  Diabetic teaching and education outpatient diabetes consult for management and improvement of her blood sugars for optimal healing and prevention of infection. Will start patient on glycemic protocol until she can resume a diet.. Will discharge patient on Janumet.

## 2023-10-22 NOTE — Anesthesia Postprocedure Evaluation (Signed)
Anesthesia Post Note  Patient: Mathea Azlin  Procedure(s) Performed: INTRAMEDULLARY (IM) NAIL TIBIAL (Left) OPEN REDUCTION INTERNAL FIXATION (ORIF) FIBULA FRACTURE  Patient location during evaluation: PACU Anesthesia Type: General and Regional Level of consciousness: awake and alert Pain management: pain level controlled Vital Signs Assessment: post-procedure vital signs reviewed and stable Respiratory status: spontaneous breathing, nonlabored ventilation, respiratory function stable and patient connected to nasal cannula oxygen Cardiovascular status: blood pressure returned to baseline and stable Postop Assessment: no apparent nausea or vomiting Anesthetic complications: no   No notable events documented.   Last Vitals:  Vitals:   10/22/23 1400 10/22/23 1415  BP: 115/62 (!) 115/91  Pulse: 90 87  Resp: 20 17  Temp:    SpO2: 97% 95%    Last Pain:  Vitals:   10/22/23 1415  TempSrc:   PainSc: Asleep                 Louie Boston

## 2023-10-22 NOTE — Assessment & Plan Note (Addendum)
Unclear the etiology of this patient's fall, will obtain UDS and ethanol level. Physical therapy consult per ortho and pain control.

## 2023-10-22 NOTE — Plan of Care (Signed)

## 2023-10-22 NOTE — Assessment & Plan Note (Addendum)
Will resume any home meds once med rec is available

## 2023-10-22 NOTE — Anesthesia Procedure Notes (Signed)
Anesthesia Regional Block: Popliteal block   Pre-Anesthetic Checklist: , timeout performed,  Correct Patient, Correct Site, Correct Laterality,  Correct Procedure, Correct Position, site marked,  Risks and benefits discussed,  Surgical consent,  Pre-op evaluation,  At surgeon's request and post-op pain management  Laterality: Lower and Left  Prep: chloraprep       Needles:  Injection technique: Single-shot  Needle Type: Echogenic Needle     Needle Length: 9cm  Needle Gauge: 21     Additional Needles:   Procedures:,,,, ultrasound used (permanent image in chart),,    Narrative:  Start time: 10/22/2023 9:21 AM End time: 10/22/2023 9:23 AM Injection made incrementally with aspirations every 5 mL.  Performed by: Personally  Anesthesiologist: Louie Boston, MD  Additional Notes: Patient's chart reviewed and they were deemed appropriate candidate for procedure, at surgeon's request. Patient educated about risks, benefits, and alternatives of the block including but not limited to: temporary or permanent nerve damage, bleeding, infection, damage to surround tissues, block failure, local anesthetic toxicity. Patient expressed understanding. A formal time-out was conducted consistent with institution rules.  Monitors were applied, and minimal sedation used. The site was prepped with skin prep and allowed to dry, and sterile gloves were used. A high frequency linear ultrasound probe with probe cover was utilized throughout. Popliteal artery pulsatile and visualized in popliteal fossa along with adjacent sciatic nerve and its branch point, which appeared anatomically normal, local anesthetic injected around them just proximal to the branch point, and echogenic block needle trajectory was monitored throughout. Aspiration performed every 5ml. Blood vessels were avoided. All injections were performed without resistance and free of blood and paresthesias. The patient tolerated the procedure  well.  Injectate: 10cc 0.5% bupivicaine  + 10cc Exparel

## 2023-10-22 NOTE — Discharge Instructions (Signed)
ORTHOPEDIC DISCHARGE INSTRUCTIONS  Follow Up Appointment:  Follow-up in the office in 10-14 days.  Please contact the [facility] at [phone]  to schedule your follow-up appointment.  Dressing and cast care instructions:  Keep the SPLINT clean, dry, and intact until follow-up in the office.  Weight-bearing status:  You are non-weightbearing on the LEFT LOWER EXTREMITY with your walker/crutches.   Diet:   You may resume your regular diet as tolerated.  Begin with clear liquids and slowly advance to your normal diet.  Medications:   Take your medicines as prescribed. You may have written prescriptions or your prescriptions may have been E-prescribed to your pharmacy. If you were given prescriptions for any blood thinners, it is a very important that you take these medications as prescribed to prevent blood clots.  General instructions:  Elevate the affected extremity to control pain and swelling. Apply ice packs to the affected area to control pain and swelling.  Surgery and pain medications may lead to constipation. It is easier to prevent this than it is to treat it. We recommend MiraLAX or Senna/Senakot for laxatives and Colace as a stool softener as needed after surgery. Drink plenty of fluids to stay well-hydrated. Consult your local pharmacist for other treatment recommendations.   Please perform deep breathing exercises every hour to increase the airflow in your lungs which could predispose you to running a low-grade fever and increase your risk of pneumonia.

## 2023-10-22 NOTE — Anesthesia Procedure Notes (Signed)
Anesthesia Regional Block: Adductor canal block   Pre-Anesthetic Checklist: , timeout performed,  Correct Patient, Correct Site, Correct Laterality,  Correct Procedure, Correct Position, site marked,  Risks and benefits discussed,  Surgical consent,  Pre-op evaluation,  At surgeon's request and post-op pain management  Laterality: Lower and Left  Prep: chloraprep       Needles:  Injection technique: Single-shot  Needle Type: Echogenic Needle     Needle Length: 9cm  Needle Gauge: 21     Additional Needles:   Procedures:,,,, ultrasound used (permanent image in chart),,    Narrative:  Start time: 10/22/2023 9:34 AM End time: 10/22/2023 9:36 AM Injection made incrementally with aspirations every 5 mL.  Performed by: Personally  Anesthesiologist: Louie Boston, MD  Additional Notes: Patient's chart reviewed and they were deemed appropriate candidate for procedure, per surgeon's request. Patient educated about risks, benefits, and alternatives of the block including but not limited to: temporary or permanent nerve damage, bleeding, infection, damage to surround tissues, block failure, local anesthetic toxicity. Patient expressed understanding. A formal time-out was conducted consistent with institution rules.  Monitors were applied, and minimal sedation used (see nursing record). The site was prepped with skin prep and allowed to dry, and sterile gloves were used. A high frequency linear ultrasound probe with probe cover was utilized throughout. Femoral artery visualized at mid-thigh level, local anesthetic injected anterolateral to it, and echogenic block needle trajectory was monitored throughout. Hydrodissection of saphenous nerve visualized and appeared anatomically normal. Aspiration performed every 5ml. Blood vessels were avoided. All injections were performed without resistance and free of blood and paresthesias. The patient tolerated the procedure well.  Injectate: 10cc 0.5%  bupivacaine + 10cc Exparel

## 2023-10-22 NOTE — Consult Note (Signed)
Consult Note:   Cassandra Flynn:[id number] DOB: 1996-02-02 DOA: 10/21/2023 PCP: [facility]  Requesting physician: Dr. Rosann Auerbach orthopedics Reason for consultation: Medical management   History of Present Illness:   Cassandra Flynn is an 27 y.o. female with medical history significant for morbid obesity, non-insulin-dependent diabetes mellitus poorly controlled diabetes with a glucosuria, allergy to penicillin and asthma, history of multifocal COVID-pneumonia, was admitted earlier on the orthopedic service for left ankle fracture yesterday came to the emergency room for severe pain with bearing weight difficulty with ambulation.  She was walking and slipped and twisted her left ankle patient was urgently seen in the emergency room for surgical intervention. Chart review shows that patient presented to the emergency room on the ninth with pain in her leg no loss of consciousness x-ray at that time showing fracture of the distal left tibia and left fibula, patient was sedated and had reduction postreduction x-rays showed improved anatomic alignment and patient was splinted in a short leg posterior splint with stirrup. Patient was then seen today for pain and left ankle x-ray showed slightly more displacement than the postreduction x-ray.  Patient underwent open reduction internal fixation of the fibula fracture and intramedullary nail for her tibial fracture.  Patient is postop and consult requested for medical management.  Review of Systems:  Review of Systems  Musculoskeletal:  Positive for joint pain.     Allergies:    Allergies  Allergen Reactions   Penicillins Itching, Swelling and Rash    Has patient had a PCN reaction causing immediate rash, facial/tongue/throat swelling, SOB or lightheadedness with hypotension: Unknown Has patient had a PCN reaction causing severe rash involving mucus membranes or skin necrosis: Unknown Has patient had a PCN  reaction that required hospitalization: Unknown Has patient had a PCN reaction occurring within the last 10 years: Unknown If all of the above answers are "NO", then may proceed with Cephalosporin use.     Past Medical History:    Past Medical History:  Diagnosis Date   Asthma    Diabetes mellitus without complication (HCC)    Diabetic ketoacidosis (HCC) 06/22/2019   Hypertension    Sleep apnea    Past Surgical History:   Past Surgical History:  Procedure Laterality Date   CESAREAN SECTION     CHOLECYSTECTOMY     SKIN GRAFT     TONSILLECTOMY      Current Medications:   Scheduled Meds:  heparin  5,000 Units Subcutaneous Q8H   [START ON 10/23/2023] insulin aspart  0-15 Units Subcutaneous TID WC   Continuous Infusions: PRN Meds:.diphenhydrAMINE, HYDROcodone-acetaminophen, HYDROcodone-acetaminophen, morphine injection, ondansetron  Social History:    reports that she has been smoking cigarettes. She has never used smokeless tobacco. She reports that she does not drink alcohol and does not use drugs.  Physical Exam:   Physical Exam Vitals and nursing note reviewed.  Constitutional:      General: She is not in acute distress.    Appearance: She is obese. She is not ill-appearing.  HENT:     Head: Normocephalic and atraumatic.     Right Ear: Hearing normal.     Left Ear: Hearing normal.     Nose: Nose normal. No nasal deformity.     Mouth/Throat:     Lips: Pink.     Tongue: No lesions.     Pharynx: Oropharynx is clear.  Eyes:     General: Lids are normal.  Extraocular Movements: Extraocular movements intact.  Cardiovascular:     Rate and Rhythm: Normal rate and regular rhythm.     Heart sounds: Normal heart sounds.  Pulmonary:     Effort: Pulmonary effort is normal.     Breath sounds: Normal breath sounds.  Abdominal:     General: Bowel sounds are normal. There is no distension.     Palpations: Abdomen is soft. There is no mass.     Tenderness: There is  no abdominal tenderness.  Skin:    General: Skin is warm.  Neurological:     Mental Status: She is alert and oriented to person, place, and time.     Cranial Nerves: Cranial nerves 2-12 are intact.  Psychiatric:        Attention and Perception: Attention normal.        Speech: Speech normal.        Behavior: Behavior normal. Behavior is cooperative.    Data Review:   Labs:  Basic Metabolic Panel: Recent Labs  Lab 10/21/23 1405  NA 132*  K 4.4  CL 99  CO2 20*  GLUCOSE 324*  BUN 11  CREATININE 0.93  CALCIUM 9.0   GFR Estimated Creatinine Clearance: 155.3 mL/min (by C-G formula based on SCr of 0.93 mg/dL). Liver Function Tests: No results for input(s): "AST", "ALT", "ALKPHOS", "BILITOT", "PROT", "ALBUMIN" in the last 168 hours. No results for input(s): "LIPASE", "AMYLASE" in the last 168 hours. No results for input(s): "AMMONIA" in the last 168 hours. Coagulation profile No results for input(s): "INR", "PROTIME" in the last 168 hours.  CBC: Recent Labs  Lab 10/21/23 1405  WBC 12.2*  HGB 14.2  HCT 41.1  MCV 87.3  PLT 282   Cardiac Enzymes: Recent Labs  Lab 10/21/23 1405  CKTOTAL 204   BNP: Invalid input(s): "POCBNP" CBG: Recent Labs  Lab 10/22/23 0802 10/22/23 1353 10/22/23 1441  GLUCAP 270* 356* 373*   D-Dimer No results for input(s): "DDIMER" in the last 72 hours. Hgb A1c No results for input(s): "HGBA1C" in the last 72 hours. Lipid Profile No results for input(s): "CHOL", "HDL", "LDLCALC", "TRIG", "CHOLHDL", "LDLDIRECT" in the last 72 hours. Thyroid function studies No results for input(s): "TSH", "T4TOTAL", "T3FREE", "THYROIDAB" in the last 72 hours.  Invalid input(s): "FREET3" Anemia work up No results for input(s): "VITAMINB12", "FOLATE", "FERRITIN", "TIBC", "IRON", "RETICCTPCT" in the last 72 hours. Urinalysis    Component Value Date/Time   COLORURINE YELLOW (A) 04/02/2021 2147   APPEARANCEUR CLEAR (A) 04/02/2021 2147   APPEARANCEUR  Clear 06/15/2014 0625   LABSPEC 1.043 (H) 04/02/2021 2147   LABSPEC 1.011 06/15/2014 0625   PHURINE 6.0 04/02/2021 2147   GLUCOSEU >=500 (A) 04/02/2021 2147   GLUCOSEU Negative 06/15/2014 0625   HGBUR SMALL (A) 04/02/2021 2147   BILIRUBINUR NEGATIVE 04/02/2021 2147   BILIRUBINUR Negative 06/15/2014 0625   KETONESUR 5 (A) 04/02/2021 2147   PROTEINUR NEGATIVE 04/02/2021 2147   NITRITE NEGATIVE 04/02/2021 2147   LEUKOCYTESUR NEGATIVE 04/02/2021 2147   LEUKOCYTESUR Negative 06/15/2014 0625   Sepsis Labs Recent Labs  Lab 10/21/23 1405  WBC 12.2*   Microbiology Recent Results (from the past 240 hour(s))  Surgical PCR screen     Status: None   Collection Time: 10/22/23  3:32 AM   Specimen: Nasal Mucosa; Nasal Swab  Result Value Ref Range Status   MRSA, PCR NEGATIVE NEGATIVE Final   Staphylococcus aureus NEGATIVE NEGATIVE Final    Comment: (NOTE) The Xpert SA Assay (FDA approved for  NASAL specimens in patients 52 years of age and older), is one component of a comprehensive surveillance program. It is not intended to diagnose infection nor to guide or monitor treatment. Performed at [facility], [address], [place], [place] [address]    Radiological Exams: DG Tibia/Fibula Left  Result Date: 10/22/2023 CLINICAL DATA:  Elective surgery. EXAM: LEFT TIBIA AND FIBULA - 2 VIEW COMPARISON:  Preoperative imaging. FINDINGS: Four fluoroscopic spot views of the left tibia and fibula obtained in the operating room. Tibial intramedullary nail with proximal and distal locking screw fixation traversing distal fibular fracture. Lateral plate and screw fixation of distal fibular fracture. Fluoroscopy time 2 minutes 39 seconds. Dose 11.73 mGy. IMPRESSION: Intraoperative fluoroscopy during tibial and fibular fracture fixation. Electronically Signed   By: Narda Rutherford M.D.   On: 10/22/2023 15:47   DG C-Arm 1-60 Min-No Report  Result Date: 10/22/2023 Fluoroscopy was utilized by  the requesting physician.  No radiographic interpretation.   DG C-Arm 1-60 Min-No Report  Result Date: 10/22/2023 Fluoroscopy was utilized by the requesting physician.  No radiographic interpretation.   DG C-Arm 1-60 Min-No Report  Result Date: 10/22/2023 Fluoroscopy was utilized by the requesting physician.  No radiographic interpretation.   [place] OR NERVE BLOCK-IMAGE ONLY [facility])  Result Date: 10/22/2023 There is no interpretation for this exam.  This order is for images obtained during a surgical procedure.  Please See "Surgeries" Tab for more information regarding the procedure.   DG Ankle Left Port  Result Date: 10/21/2023 CLINICAL DATA:  Recent fracture.  Increased pain. EXAM: PORTABLE LEFT ANKLE - 2 VIEW COMPARISON:  Left ankle radiographs dated October 20, 2023. FINDINGS: Again seen is an oblique fracture of the distal tibial diaphysis with approximately 11 mm of medial displacement of the distal fracture segment, compared to 8 mm previously, as well as 7 mm of anterior displacement, which is new since the prior exam. There is approximately 10 degrees of apex anterior angulation, which is new since the prior exam. Similar comminuted fracture of the left distal fibula with a proximally 5 mm of medial displacement of the distal fragment. No additional fracture identified. No evidence of dislocation. Diffuse soft tissue of the visualized lower extremity. IMPRESSION: 1. Increased anteromedial displacement and new mild anterior angulation of the oblique distal tibial fracture. 2. Similar comminuted mildly displaced fracture of the left distal fibula. Electronically Signed   By: Hart Robinsons M.D.   On: 10/21/2023 16:11    Assessment and Plan:   Assessment & Plan Fracture of left tibia and fibula, closed, initial encounter Unclear the etiology of this patient's fall, will obtain UDS and ethanol level. Physical therapy consult per ortho and pain control.   Diabetes (HCC) Poorly  controlled.  Diabetic teaching and education outpatient diabetes consult for management and improvement of her blood sugars for optimal healing and prevention of infection. Will start patient on glycemic protocol until she can resume a diet.. Will discharge patient on Janumet. Severe bipolar I disorder, most recent episode mixed, with psychotic features (HCC) Will resume any home meds once med rec is available OSA (obstructive sleep apnea) CPAP per home settings.   Thank you for this consultation.  We will follow the patient with you.  Time Spent on Consult: 35 minutes.   Gertha Calkin 10/22/2023, 7:35 PM

## 2023-10-22 NOTE — Transfer of Care (Signed)
Immediate Anesthesia Transfer of Care Note  Patient: [name]  Procedure(s) Performed: INTRAMEDULLARY (IM) NAIL TIBIAL (Left) OPEN REDUCTION INTERNAL FIXATION (ORIF) FIBULA FRACTURE  Patient Location: PACU  Anesthesia Type:General  Level of Consciousness: sedated  Airway & Oxygen Therapy: Patient Spontanous Breathing and Patient connected to face mask oxygen  Post-op Assessment: Report given to RN and Post -op Vital signs reviewed and stable  Post vital signs: Reviewed  Last Vitals:  Vitals Value Taken Time  BP 96/49 10/22/23 1338  Temp 36.7 C 10/22/23 1338  Pulse 91 10/22/23 1340  Resp 19 10/22/23 1340  SpO2 100 % 10/22/23 1340  Vitals shown include unfiled device data.  Last Pain:         Complications: No notable events documented.

## 2023-10-22 NOTE — Op Note (Signed)
ORTHOPEDIC OPERATIVE REPORT Perlie Ohlmann [id number] 10/22/2023 Pre-op Diagnosis:  Closed left distal tibia and left distal fibular fracture Post-op Diagnosis:  Closed left distal tibia and left distal fibular fracture Procedure:  INTRAMEDULLARY (IM) NAIL TIBIAL: 95284 (CPT) OPEN REDUCTION INTERNAL FIXATION (ORIF) FIBULA FRACTURE:  Surgeon:  Sundra Aland. Rosann Auerbach, MD Assistant Surgeon:  Circulator: Susa Simmonds, RN Relief Circulator: Carmon Ginsberg, RN Scrub Person: Lianne Bushy, Francene Boyers Vendor Representative : Leonides Schanz OR Clinical Technician: Molinda Bailiff Anesthesia Type:   General Endotracheal Anesthesia Fluids:   800 ml of crystalloid. Estimated Blood Loss:   100 ml Urine output:   No Foley was employed for this case. Tourniquet:    120 minutes at 285 mmHg Complications:   None. Disposition:   The patient was taken to the PACU in stable condition having tolerated the procedure well. Grafts/Implants:    Implant Name Type Inv. Item Serial No. Manufacturer Lot No. LRB No. Used Action  PLATE 3HOLE DISTAL FIB 2.7 ANK - [id number] Plate PLATE 3HOLE DISTAL FIB 2.7 ANK  [employer]  Left 1 Implanted  SCREW CORT LP ST 3.5X16 - [id number] Screw SCREW CORT LP ST 3.5X16  [employer]  Left 1 Implanted  SCREW CORTEX 3.5X12MM - [id number] Screw SCREW CORTEX 3.5X12MM  [employer]  Left 1 Implanted  SCREW LOCK VA ST 2.7X18 - [id number] Screw SCREW LOCK VA ST 2.7X18  [employer]  Left 5 Implanted  NAIL TIB TFNA STRL 9X360 - [id number] Nail NAIL TIB TFNA STRL 9X360  [employer] [id number] Left 1 Implanted  SCREW LOCK IM 5X62 - [id number] Screw SCREW LOCK IM 5X62  [employer] [id number] Left 1 Implanted  SCREW LOCK IM 5X62 - [id number] Screw SCREW LOCK IM 5X62  [employer] [id number] Left 1 Implanted  SCREW LOCK IM NAIL 5X34 - [id number] Screw SCREW LOCK IM NAIL 5X34  [employer] [id number] Left 1 Implanted  SCREW LOCK IM 5X36 -  [id number] Screw SCREW LOCK IM 5X36  [employer] [id number] Left 1 Implanted   Specimen(s):   None. Indication for surgery:   Patient is a 27 year old female who suffered a ground-level fall fracturing her left distal tibia and fibula.  Reduction/internal fixation was recommended. Operative technique:   After obtaining informed consent, the patient was taken to the operating room and placed on the operating room table in the supine position. A time-out then occurred to positively identify the patient and they were placed under General Endotracheal Anesthesia. Once an adequate level of anesthesia had been achieved, a tourniquet was placed on the left upper thigh. While the left lower extremity was held by an assistant, the left lower extremity was prepped and draped in routine fashion with Hibiclens and alcohol. I marked the proposed incision beginning at the tibial tuberosity extending to the inferior pole of the patella.   A time-out then occurred per Joint Commission recommendations to positively identify the patient, the site, the procedure, and the surgeon. All concurred.  The leg was held elevated and exsanguinated with an Esmarch. The tourniquet was inflated.   The proposed incision over the distal fibula was marked.  The incision made with a #10 blade.  The subcutaneous tissue was dissected down to the bone and the fracture site.  I used a periosteal elevator to elevate the periosteum off the fracture site of the distal tibia and the fracture hematoma was lavaged and evacuated with suction and a curette.  The appropriate size Synthes distal fibular variable angle  locking plate was evaluated.  Reduction forceps were used to reduce the fracture which was comminuted with an anterior fragment and a posterior fragment as well as the distal fragment.  When I was happy with the reduction the plate was fitted to the lateral aspect of the fibula and temporarily held in place through the slotted hole with  a nonlocking cortical screw.  Under fluoroscopic guidance I fine-tune the plate position.  Once the plate was positioned the distal holes were filled in locking fashion in standard technique by drilling, measuring, and inserting the appropriate length variable angle locking screws.  A second nonlocking screw was placed in the proximal aspect of the plate.  All screws received a final tightening.  Multiple fluoroscopic images were used to make sure that the distal fibula fracture was in anatomic alignment and the mortise was well reduced.  No syndesmosis screw was indicated.  The area was copiously lavaged with normal saline.  The deep fascia was closed with 0 Vicryl in an inverted interrupted pattern.  The subcutaneous tissue was closed with 3-0 Vicryl in an inverted pattern.  The skin was stapled.  I then turned my attention to placement of the tibial nail.  Tibia IM nail portion of the case.  The incision was made with a #10 blade. Hemostasis was achieved with electrocautery. Metzenbaum scissors were used to carefully dissect the subcutaneous tissue. The peritenon was carefully incised with a #15 blade and Metzenbaum scissors employed to dissect around the tendon to the proximal anterior aspect of the tibia. Under fluoroscopic guidance a guide pin was placed on the proximal anterior aspect of the tibia on midline and confirmed with fluoroscopy.  Because of the patient's body habitus closed reduction of the tibia was not satisfactory and I made an anterior incision through the skin and carefully dissected through the subcutaneous tissue to expose the anterior aspect of the tibia at the fracture site.  A key elevator was used to open the fracture site and the fracture hematoma was reduced.  A clamp was placed to hold the distal tibia fracture in anatomic alignment for the remainder of the case until the nail was locked.  I tapped the guide pin into the tibia and with a soft tissue protector in place used the  opening drill to open the canal. I then placed a ball-tipped guide wire down the canal to just proximal to the fracture site. I then held the fracture reduced and confirmed the position with fluoroscopy while the surgical assistant passed the guide pin gently into the distal tibia on midline to the level of the old physeal scar. The guide pin was measured and a 9 mm tibial nail was selected.  While the surgical assistant held the fracture reduced, I began reaming with an 8.5 mm starting reamer over the guide wire. I increased the reamers in half millimeter increments until good chatter was achieved at 10 mm. I selected a 9 mm x 360 mm Synthes tibial nail and attached it to its alignment jig and confirmed screw hole alignment. While the assistant held the fracture reduced, I gently tapped the nail over the guide wire and across the fracture site into the distal tibia. Fluoroscopy was employed during this process to make sure the fracture remained aligned and the rod appropriately placed. The guide wire was removed. I confirmed nail length proximally. When I was satisfied with the reduction of the fracture and the nail placement, 2 proximal oblique screws were placed in standard fashion through  the jig through small stab incisions. Fluoroscopy was again utilized to make sure of appropriate screw length during insertion.  I then turned my attention to the distal interlocking screws. I gently tapped on the bottom of the patient's foot to compress the fracture site. Using fluoroscopy I used the "perfect circle" technique to place 2 interlocking screws distally from medial to lateral and/or anterior to posterior through small stab incisions after using a hemostat to dissect down to the bone. Final images were obtained to confirm reduction, nail length, and screw placement.  The proximal incision was copiously lavaged to remove any bone fragments from the anterior aspect of the knee. The peritenon was closed with 4-0  Vicryl in a continuous pattern. The subcutaneous tissue was again lavaged with normal saline and closed with 2-0 Vicryl in an inverted interrupted pattern. The skin was stapled. The multiple small stab incisions for the interlocking screws were lavaged with normal saline and the skin stapled. A nonadherent dressing was applied and the patient placed into a well-padded posterior splint with medial and lateral side struts. All sponge and needle counts were correct at the end of the case. The tourniquet was deflated and brisk capillary refill time returned to all 5 toes at the end of the case. The patient was taken to the recovery room in stable condition having tolerated the procedure well.  The patient's family was updated on the surgical findings and the patient's current condition.  Implant: Synthes Tibial Nail 9 mm x 360 mm.    Cecil Cranker M.D. 10/22/2023 1:52 PM

## 2023-10-22 NOTE — Assessment & Plan Note (Addendum)
CPAP per home settings.   

## 2023-10-22 NOTE — Anesthesia Preprocedure Evaluation (Signed)
Anesthesia Evaluation  Patient identified by MRN, date of birth, ID band Patient awake    Reviewed: Allergy & Precautions, NPO status , Patient's Chart, lab work & pertinent test results  History of Anesthesia Complications Negative for: history of anesthetic complications  Airway Mallampati: III  TM Distance: >3 FB Neck ROM: full    Dental no notable dental hx.    Pulmonary asthma , sleep apnea , Current Smoker and Patient abstained from smoking.   Pulmonary exam normal        Cardiovascular hypertension, On Medications negative cardio ROS Normal cardiovascular exam     Neuro/Psych  PSYCHIATRIC DISORDERS Anxiety  Bipolar Disorder      GI/Hepatic negative GI ROS, Neg liver ROS,,,  Endo/Other  diabetes, Poorly Controlled, Insulin Dependent  Morbid obesity  Renal/GU      Musculoskeletal   Abdominal   Peds  Hematology negative hematology ROS (+)   Anesthesia Other Findings Past Medical History: No date: Asthma No date: Diabetes mellitus without complication (HCC) No date: Hypertension No date: Sleep apnea  Past Surgical History: No date: CESAREAN SECTION No date: CHOLECYSTECTOMY No date: SKIN GRAFT No date: TONSILLECTOMY  BMI    Body Mass Index: 49.14 kg/m      Reproductive/Obstetrics negative OB ROS                             Anesthesia Physical Anesthesia Plan  ASA: 3  Anesthesia Plan: General ETT   Post-op Pain Management: Tylenol PO (pre-op)* and Toradol IV (intra-op)*   Induction: Intravenous  PONV Risk Score and Plan: 2 and Ondansetron, Dexamethasone, Midazolam and Treatment may vary due to age or medical condition  Airway Management Planned: Oral ETT  Additional Equipment:   Intra-op Plan:   Post-operative Plan: Extubation in OR  Informed Consent: I have reviewed the patients History and Physical, chart, labs and discussed the procedure including the  risks, benefits and alternatives for the proposed anesthesia with the patient or authorized representative who has indicated his/her understanding and acceptance.     Dental Advisory Given  Plan Discussed with: Anesthesiologist, CRNA and Surgeon  Anesthesia Plan Comments: (Patient consented for risks of anesthesia including but not limited to:  - adverse reactions to medications - damage to eyes, teeth, lips or other oral mucosa - nerve damage due to positioning  - sore throat or hoarseness - Damage to heart, brain, nerves, lungs, other parts of body or loss of life  Patient voiced understanding and assent.)       Anesthesia Quick Evaluation

## 2023-10-22 NOTE — Anesthesia Procedure Notes (Signed)
Procedure Name: Intubation Date/Time: 10/22/2023 10:10 AM  Performed by: Mathews Argyle, CRNAPre-anesthesia Checklist: Patient identified, Patient being monitored, Timeout performed, Emergency Drugs available and Suction available Patient Re-evaluated:Patient Re-evaluated prior to induction Oxygen Delivery Method: Circle system utilized Preoxygenation: Pre-oxygenation with 100% oxygen Induction Type: IV induction Ventilation: Mask ventilation without difficulty Laryngoscope Size: 3 and McGraph Grade View: Grade I Tube type: Oral Tube size: 7.0 mm Number of attempts: 1 Airway Equipment and Method: Stylet and Video-laryngoscopy Placement Confirmation: ETT inserted through vocal cords under direct vision, positive ETCO2 and breath sounds checked- equal and bilateral Secured at: 23 cm Tube secured with: Tape Dental Injury: Teeth and Oropharynx as per pre-operative assessment

## 2023-10-23 ENCOUNTER — Encounter: Payer: Self-pay | Admitting: Orthopedic Surgery

## 2023-10-23 DIAGNOSIS — S82402A Unspecified fracture of shaft of left fibula, initial encounter for closed fracture: Secondary | ICD-10-CM | POA: Diagnosis not present

## 2023-10-23 DIAGNOSIS — S82202A Unspecified fracture of shaft of left tibia, initial encounter for closed fracture: Secondary | ICD-10-CM | POA: Diagnosis not present

## 2023-10-23 LAB — BASIC METABOLIC PANEL
Anion gap: 10 (ref 5–15)
BUN: 15 mg/dL (ref 6–20)
CO2: 24 mmol/L (ref 22–32)
Calcium: 8.6 mg/dL — ABNORMAL LOW (ref 8.9–10.3)
Chloride: 99 mmol/L (ref 98–111)
Creatinine, Ser: 0.75 mg/dL (ref 0.44–1.00)
GFR, Estimated: >60 mL/min (ref >60)
Glucose, Bld: 311 mg/dL — ABNORMAL HIGH (ref 70–99)
Potassium: 3.4 mmol/L — ABNORMAL LOW (ref 3.5–5.1)
Sodium: 133 mmol/L — ABNORMAL LOW (ref 135–145)

## 2023-10-23 LAB — GLUCOSE, CAPILLARY
Glucose-Capillary: 271 mg/dL — ABNORMAL HIGH (ref 70–99)
Glucose-Capillary: 289 mg/dL — ABNORMAL HIGH (ref 70–99)
Glucose-Capillary: 288 mg/dL — ABNORMAL HIGH (ref 70–99)
Glucose-Capillary: 296 mg/dL — ABNORMAL HIGH (ref 70–99)

## 2023-10-23 LAB — CBC WITH DIFFERENTIAL/PLATELET
Abs Immature Granulocytes: 0.05 10*3/uL (ref 0.00–0.07)
Basophils Absolute: 0 10*3/uL (ref 0.0–0.1)
Basophils Relative: 0 %
Eosinophils Absolute: 0.1 10*3/uL (ref 0.0–0.5)
Eosinophils Relative: 0 %
HCT: 37.4 % (ref 36.0–46.0)
Hemoglobin: 12.9 g/dL (ref 12.0–15.0)
Immature Granulocytes: 0 %
Lymphocytes Relative: 27 %
Lymphs Abs: 3.8 10*3/uL (ref 0.7–4.0)
MCH: 30.2 pg (ref 26.0–34.0)
MCHC: 34.5 g/dL (ref 30.0–36.0)
MCV: 87.6 fL (ref 80.0–100.0)
Monocytes Absolute: 1.2 10*3/uL — ABNORMAL HIGH (ref 0.1–1.0)
Monocytes Relative: 9 %
Neutro Abs: 8.7 10*3/uL — ABNORMAL HIGH (ref 1.7–7.7)
Neutrophils Relative %: 64 %
Platelets: 241 10*3/uL (ref 150–400)
RBC: 4.27 MIL/uL (ref 3.87–5.11)
RDW: 12 % (ref 11.5–15.5)
WBC: 13.8 10*3/uL — ABNORMAL HIGH (ref 4.0–10.5)
nRBC: 0 % (ref 0.0–0.2)

## 2023-10-23 LAB — MAGNESIUM: Magnesium: 1.8 mg/dL (ref 1.7–2.4)

## 2023-10-23 LAB — HEMOGLOBIN A1C
Hgb A1c MFr Bld: 11.2 % — ABNORMAL HIGH (ref 4.8–5.6)
Mean Plasma Glucose: 274.74 mg/dL

## 2023-10-23 LAB — PHOSPHORUS: Phosphorus: 3.2 mg/dL (ref 2.5–4.6)

## 2023-10-23 MED ORDER — KETOROLAC TROMETHAMINE 30 MG/ML IJ SOLN
30.0000 mg | Freq: Once | INTRAMUSCULAR | Status: AC
  Administered 2023-10-23: 30 mg via INTRAVENOUS
  Filled 2023-10-23: qty 1

## 2023-10-23 MED ORDER — OXYCODONE-ACETAMINOPHEN 7.5-325 MG PO TABS: 1.0000 | ORAL_TABLET | Freq: Four times a day (QID) | ORAL | Status: DC | PRN

## 2023-10-23 MED ORDER — OXYCODONE HCL 5 MG PO TABS
5.0000 mg | ORAL_TABLET | ORAL | Status: DC | PRN
  Administered 2023-10-23 – 2023-10-24 (×4): 10 mg via ORAL
  Administered 2023-10-24 – 2023-10-25 (×2): 5 mg via ORAL
  Administered 2023-10-25 (×2): 10 mg via ORAL
  Filled 2023-10-23: qty 1
  Filled 2023-10-23 (×7): qty 2

## 2023-10-23 MED ORDER — POTASSIUM CHLORIDE CRYS ER 20 MEQ PO TBCR
40.0000 meq | EXTENDED_RELEASE_TABLET | Freq: Once | ORAL | Status: AC
  Administered 2023-10-23: 40 meq via ORAL
  Filled 2023-10-23: qty 2

## 2023-10-23 MED ORDER — INSULIN GLARGINE-YFGN 100 UNIT/ML ~~LOC~~ SOLN
55.0000 [IU] | Freq: Every day | SUBCUTANEOUS | Status: DC
  Administered 2023-10-23 – 2023-10-24 (×2): 55 [IU] via SUBCUTANEOUS
  Filled 2023-10-23 (×3): qty 0.55

## 2023-10-23 MED ORDER — ASPIRIN 325 MG PO TBEC
325.0000 mg | DELAYED_RELEASE_TABLET | Freq: Two times a day (BID) | ORAL | Status: DC
  Administered 2023-10-23 – 2023-10-25 (×5): 325 mg via ORAL
  Filled 2023-10-23 (×5): qty 1

## 2023-10-23 MED ORDER — MAGNESIUM SULFATE 2 GM/50ML IV SOLN
2.0000 g | Freq: Once | INTRAVENOUS | Status: AC
  Administered 2023-10-23: 2 g via INTRAVENOUS
  Filled 2023-10-23: qty 50

## 2023-10-23 MED ORDER — KETOROLAC TROMETHAMINE 15 MG/ML IJ SOLN
15.0000 mg | Freq: Four times a day (QID) | INTRAMUSCULAR | Status: AC
  Administered 2023-10-23 – 2023-10-24 (×4): 15 mg via INTRAVENOUS
  Filled 2023-10-23 (×4): qty 1

## 2023-10-23 NOTE — Inpatient Diabetes Management (Addendum)
Inpatient Diabetes Program Recommendations  AACE/ADA: New Consensus Statement on Inpatient Glycemic Control (2015)  Target Ranges:  Prepandial:   less than 140 mg/dL      Peak postprandial:   less than 180 mg/dL (1-2 hours)      Critically ill patients:  140 - 180 mg/dL    Latest Reference Range & Units 10/22/23 19:31  Hemoglobin A1C 4.8 - 5.6 % 11.2 (H)  274 mg/dl  (H): Data is abnormally high  Latest Reference Range & Units 10/22/23 08:02 10/22/23 13:53 10/22/23 14:41 10/22/23 21:54  Glucose-Capillary 70 - 99 mg/dL 308 (H)  8 units Novolog  10 mg Decadron @1005   356 (H)  11 units Novolog  373 (H) 282 (H)  (H): Data is abnormally high  Latest Reference Range & Units 10/23/23 08:15  Glucose-Capillary 70 - 99 mg/dL 657 (H)  8 units Novolog   (H): Data is abnormally high     Admit with: L Tibia Fracture/ L Fibular Fracture after Fall  History: DM  Home DM Meds: Lantus 55 units Daily     Metformin 1000 mg bid     Novolog 20 units TID     Novolog SSI (0-15 units)  Current Orders: Novolog Moderate Correction Scale/ SSI (0-15 units) TID AC      Semglee 55 units Daily    Note pt received Decadron X 1 dose for Surgery yesterday  Semglee added to meds today   Addendum 11:45am--Attempted to speak with pt this AM but she was not feeling well and asked me to come back tomorrow.    --Will follow patient during hospitalization--  Ambrose Finland RN, MSN, CDCES Diabetes Coordinator Inpatient Glycemic Control Team Team Pager: [phone] (8a-5p)

## 2023-10-23 NOTE — Progress Notes (Addendum)
Subjective: 1 Day Post-Op Procedure(s) (LRB): INTRAMEDULLARY (IM) NAIL TIBIAL (Left) OPEN REDUCTION INTERNAL FIXATION (ORIF) FIBULA FRACTURE Patient reports pain as moderate.   Patient is  well but does report moderate pain in the left leg. PT and care management to assist with discharge planning Negative for chest pain and shortness of breath Fever: no Gastrointestinal:Negative for nausea and vomiting Reports that she is passing gas this morning.  Objective: Vital signs in last 24 hours: Temp:  [97 F (36.1 C)-98.3 F (36.8 C)] 98.3 F (36.8 C) (11/12 1214) Pulse Rate:  [78-110] 95 (11/12 1214) Resp:  [17-20] 18 (11/12 1214) BP: (128-152)/(76-93) 135/93 (11/12 1214) SpO2:  [91 %-100 %] 100 % (11/12 1214)  Intake/Output from previous day:  Intake/Output Summary (Last 24 hours) at 10/23/2023 1444 Last data filed at 10/23/2023 1125 Gross per 24 hour  Intake 340 ml  Output 500 ml  Net -160 ml    Intake/Output this shift: Total I/O In: 240 [P.O.:240] Out: -   Labs: Recent Labs    10/21/23 1405 10/23/23 0927  HGB 14.2 12.9   Recent Labs    10/21/23 1405 10/23/23 0927  WBC 12.2* 13.8*  RBC 4.71 4.27  HCT 41.1 37.4  PLT 282 241   Recent Labs    10/21/23 1405 10/23/23 0927  NA 132* 133*  K 4.4 3.4*  CL 99 99  CO2 20* 24  BUN 11 15  CREATININE 0.93 0.75  GLUCOSE 324* 311*  CALCIUM 9.0 8.6*   No results for input(s): "LABPT", "INR" in the last 72 hours.   EXAM General - Patient is Alert, Appropriate, and Oriented Abdomen soft with intact bowel sounds. Upon entering the room her ACE wraps have been removed and splint is not secure up against her leg. Small area of bloody drainage to the proximal aspect of the dressings but otherwise no drainage is noted. She is able to full flex and extend her toes, cap refill intact. Reports decreased sensation to light touch to the foot but intact pulse to the left foot. The splint was re-applied with ACE wraps to  the left leg.  Past Medical History:  Diagnosis Date   Asthma    Diabetes mellitus without complication (HCC)    Diabetic ketoacidosis (HCC) 06/22/2019   Hypertension    Sleep apnea     Assessment/Plan: 1 Day Post-Op Procedure(s) (LRB): INTRAMEDULLARY (IM) NAIL TIBIAL (Left) OPEN REDUCTION INTERNAL FIXATION (ORIF) FIBULA FRACTURE Principal Problem:   Fracture of left tibia and fibula, closed, initial encounter Active Problems:   Severe bipolar I disorder, most recent episode mixed, with psychotic features (HCC)   Diabetes (HCC)   OSA (obstructive sleep apnea)  Estimated body mass index is 49.14 kg/m as calculated from the following:   Height as of this encounter: 5\' 11"  (1.803 m).   Weight as of this encounter: 159.8 kg. Advance diet Up with therapy D/C IV fluids when tolerating po intake.  Labs reviewed, WBC 13.8.   Reports decreased sensation, but is able to flex and extend toes without issue. Splint was re-applied to the left leg. We discussed the significant importance of remaining non-weightbearing to the left leg. Up with PT today. Will plan on keeping her in splint for 10-14 days before likely transitioning to a short leg cast.  DVT Prophylaxis - Aspirin Non-weightbearing to the left leg.  Valeria Batman, PA-C [facility] Orthopaedic Surgery 10/23/2023, 2:44 PM    Addendum: I appreciate Dr. Ophelia Charter assuming care of this medically challenged patient,  and agree with today's assessment and plan for her orthopedic care as outlined in Horris Latino, PA-C's, note.  Maryagnes Amos, MD [facility] Orthopaedic Surgery 10/23/2023, 3:33 PM

## 2023-10-23 NOTE — Evaluation (Signed)
Physical Therapy Evaluation Patient Details Name: Cassandra Flynn MRN: [id number] DOB: [birth year] Today's Date: 10/23/2023  History of Present Illness  Pt is 27 y/o admitted 10/21/23 for closed fx of the left tibia and fibula. ORIF fibula fx procedure performed 10/22/23.  Clinical Impression  Pt received in bed and agreed to PT session. Pt reports that they are doing better in comparison to this morning, pain scale is currently 9/10. Pt performed bed mobility ModI with the use of BUE to mobilize LLE, x2STS with the use of RW (2wheels) MinA primarily for technique as the use of RW was unfamiliar for the pt while also keeping in mind WB precautions, and amb fwd/bwd at EOB with RW CGA prior to ending session in bed. Pt tolerated Tx well and will continue to benefit from skilled PT sessions to improve strength, activity tolerance, gait, and functional mobility in order to maximize safety/IND following D/C.      If plan is discharge home, recommend the following: A lot of help with walking and/or transfers;Assist for transportation;Help with stairs or ramp for entrance   Can travel by private vehicle        Equipment Recommendations Rolling walker (2 wheels);BSC/3in1 (Bariatric)  Recommendations for Other Services       Functional Status Assessment Patient has had a recent decline in their functional status and demonstrates the ability to make significant improvements in function in a reasonable and predictable amount of time.     Precautions / Restrictions Precautions Precautions: Fall Restrictions Weight Bearing Restrictions: Yes LLE Weight Bearing: Non weight bearing      Mobility  Bed Mobility Overal bed mobility: Modified Independent             General bed mobility comments: Pt performs bed mobility ModI via using their arms to assist their LLE with mobility.    Transfers Overall transfer level: Needs assistance Equipment used: Rolling walker (2  wheels) Transfers: Sit to/from Stand Sit to Stand: Min assist           General transfer comment: Pt required MinA for STS with the use of RW (2wheels) primarily due to this being the pt first time using RW while maintaining WB precautions.    Ambulation/Gait Ambulation/Gait assistance: Contact guard assist Gait Distance (Feet): 5 Feet Assistive device: Rolling walker (2 wheels) Gait Pattern/deviations: Step-to pattern, Antalgic Gait velocity: decreased, cautious     General Gait Details: Pt took fwd/bwd hops from EOB with the use of RW (2wheels) CGA. Pt did not report any s/sx relative to dizziness however, pt experienced an increase in LLE pain 10/10  Stairs            Wheelchair Mobility     Tilt Bed    Modified Rankin (Stroke Patients Only)       Balance Overall balance assessment: Needs assistance Sitting-balance support: Feet supported Sitting balance-Leahy Scale: Normal     Standing balance support: Bilateral upper extremity supported, During functional activity, Reliant on assistive device for balance Standing balance-Leahy Scale: Good Standing balance comment: CGA for saftey                             Pertinent Vitals/Pain Pain Assessment Pain Assessment: 0-10 Pain Score: 9  Pain Location: LLE Pain Descriptors / Indicators: Burning, Sharp, Constant, Throbbing Pain Intervention(s): Monitored during session, Limited activity within patient's tolerance, Ice applied    Home Living Family/patient expects to be discharged to:: Private residence  Living Arrangements: Other relatives Available Help at Discharge: Family;Available 24 hours/day Type of Home: Apartment Home Access: Stairs to enter Entrance Stairs-Rails: None Entrance Stairs-Number of Steps: 1   Home Layout: One level Home Equipment: Crutches;Hand held shower head Additional Comments: Pt reports that they live with their sister and neice.    Prior Function Prior Level of  Function : Independent/Modified Independent             Mobility Comments: Pt reports IND prior to admission. With initial injury, pt was instructed to use cructhes with NWB precautions. ADLs Comments: Pt reports IND prior to admission.     Extremity/Trunk Assessment   Upper Extremity Assessment Upper Extremity Assessment: Overall WFL for tasks assessed    Lower Extremity Assessment Lower Extremity Assessment: LLE deficits/detail LLE Deficits / Details: ORIF fibula fx recovery (Pt reports their toe mobility has come back this morning.) LLE Sensation: decreased light touch (Pt reports that they sensation is slowly coming back but they currently are unable to feel their toes.)       Communication   Communication Communication: No apparent difficulties Cueing Techniques: Verbal cues  Cognition Arousal: Alert Behavior During Therapy: WFL for tasks assessed/performed Overall Cognitive Status: Within Functional Limits for tasks assessed                                 General Comments: AOx4. Pt pleasant and willing to participate in PT session.        General Comments      Exercises     Assessment/Plan    PT Assessment Patient needs continued PT services  PT Problem List Decreased strength;Decreased range of motion;Decreased activity tolerance;Decreased balance;Decreased mobility;Decreased knowledge of use of DME;Pain       PT Treatment Interventions DME instruction;Gait training;Therapeutic activities;Functional mobility training;Balance training;Modalities    PT Goals (Current goals can be found in the Care Plan section)  Acute Rehab PT Goals Patient Stated Goal: To decrease pain PT Goal Formulation: With patient Time For Goal Achievement: 11/06/23 Potential to Achieve Goals: Good    Frequency Min 1X/week     Co-evaluation               AM-PAC PT "6 Clicks" Mobility  Outcome Measure Help needed turning from your back to your side  while in a flat bed without using bedrails?: None Help needed moving from lying on your back to sitting on the side of a flat bed without using bedrails?: A Little Help needed moving to and from a bed to a chair (including a wheelchair)?: A Little Help needed standing up from a chair using your arms (e.g., wheelchair or bedside chair)?: A Little Help needed to walk in hospital room?: A Lot Help needed climbing 3-5 steps with a railing? : A Lot 6 Click Score: 17    End of Session Equipment Utilized During Treatment: Gait belt Activity Tolerance: Patient tolerated treatment well;Patient limited by pain Patient left: in bed;with call bell/phone within reach Nurse Communication: Mobility status PT Visit Diagnosis: Other abnormalities of gait and mobility (R26.89);Muscle weakness (generalized) (M62.81);Pain Pain - Right/Left: Left Pain - part of body: Leg;Ankle and joints of foot    Time: 1345-1410 PT Time Calculation (min) (ACUTE ONLY): 25 min   Charges:   PT Evaluation $PT Eval Moderate Complexity: 1 Mod PT Treatments $Gait Training: 8-22 mins PT General Charges $$ ACUTE PT VISIT: 1 Visit  Adena Sima [employer] SPT, LAT, [name]  Lakindra Wible [place] 10/23/2023, 3:59 PM

## 2023-10-23 NOTE — Consult Note (Signed)
Initial Consultation Note   Patient: Cassandra Flynn ZOX:[id number] DOB: [birth year] PCP: [facility] DOA: 10/21/2023 DOS: the patient was seen and examined on 10/23/2023 Primary service: Cecil Cranker  Referring physician: Rosann Auerbach Reason for consult: Medical management   Assessment and Plan:  L tib/fib fracture S/p IM nailing PT consult Given her insurance plan, she is not eligible for SNF rehab or [facility] services, will need outpatient PT  Encourage OOB when possible  DM A1c is 11.2 - very poor control hold Glucophage Resume glargine Cover with moderate-scale SSI  DM coordinator consulted  Bipolar d/o No apparent home meds  OSA Continue CPAP  Morbid obesity Body mass index is 49.14 kg/m.Marland Kitchen  Weight loss should be encouraged Outpatient PCP/bariatric medicine/bariatric surgery f/u encouraged    Consultants: Orthopedics  Procedures: Reduction under conscious sedation 11/10 IM nailing 11/11  Antibiotics: Cefazolin x 1  30 Day Unplanned Readmission Risk Score    Flowsheet Row ED to Hosp-Admission (Current) from 10/21/2023 in [facility] (1A)  30 Day Unplanned Readmission Risk Score (%) 8.82 Filed at 10/23/2023 0401       This score is the patient's risk of an unplanned readmission within 30 days of being discharged (0 -100%). The score is based on dignosis, age, lab data, medications, orders, and past utilization.   Low:  0-14.9   Medium: 15-21.9   High: 22-29.9   Extreme: 30 and above            [facility] will continue to follow the patient.  We will assume care, as her primary issues post-operatively are mobility and DM-related.   HPI: Cassandra Flynn is a 27 y.o. female with past medical history of morbid obesity and poorly controlled DM who presented on 11/10 with L ankle fracture.  She underwent L tib/fib reduction and was splinted.  She later underwent IM nailing.  Patient seen this  AM, suggested discharge - she is adamantly opposed and reports that she is only now getting pain control.  She would probably benefit from SNF rehab due to need for 6 weeks of NWB status but she is unable to get rehab or even home health services based on her insurance plan.  Review of Systems: As mentioned in the history of present illness. All other systems reviewed and are negative. Past Medical History:  Diagnosis Date   Asthma    Diabetes mellitus without complication (HCC)    Diabetic ketoacidosis (HCC) 06/22/2019   Hypertension    Sleep apnea    Past Surgical History:  Procedure Laterality Date   CESAREAN SECTION     CHOLECYSTECTOMY     SKIN GRAFT     TONSILLECTOMY     Social History:  reports that she has been smoking cigarettes. She has never used smokeless tobacco. She reports that she does not drink alcohol and does not use drugs.  Allergies  Allergen Reactions   Penicillins Itching, Swelling and Rash    Has patient had a PCN reaction causing immediate rash, facial/tongue/throat swelling, SOB or lightheadedness with hypotension: Unknown Has patient had a PCN reaction causing severe rash involving mucus membranes or skin necrosis: Unknown Has patient had a PCN reaction that required hospitalization: Unknown Has patient had a PCN reaction occurring within the last 10 years: Unknown If all of the above answers are "NO", then may proceed with Cephalosporin use.     History reviewed. No pertinent family history.  Prior to Admission medications   Medication Sig  Start Date End Date Taking? Authorizing Provider  albuterol (PROVENTIL HFA;VENTOLIN HFA) 108 (90 Base) MCG/ACT inhaler Inhale 2 puffs into the lungs every 6 (six) hours as needed for wheezing or shortness of breath. Patient not taking: Reported on 04/02/2021 08/11/17   Willy Eddy, MD  blood glucose meter kit and supplies KIT Dispense based on patient and insurance preference. Use up to four times daily as  directed. 04/07/21   Tyrone Nine, MD  dextromethorphan-guaiFENesin (MUCINEX DM) 30-600 MG 12hr tablet Take 1 tablet by mouth 2 (two) times daily. 04/07/21   Tyrone Nine, MD  HYDROcodone-acetaminophen (NORCO) 5-325 MG tablet Take 1 tablet by mouth every 6 (six) hours as needed for up to 5 days for moderate pain (pain score 4-6). 10/20/23 10/25/23  Trinna Post, MD  insulin aspart (NOVOLOG) 100 UNIT/ML FlexPen Inject 20-35 Units into the skin 3 (three) times daily with meals. 20 units with meals with additional sliding scale dose based on blood sugar (0-15 units) 04/07/21   Tyrone Nine, MD  insulin glargine (LANTUS SOLOSTAR) 100 UNIT/ML Solostar Pen Inject 55 Units into the skin daily. 04/07/21   Tyrone Nine, MD  Insulin Pen Needle 32G X 4 MM MISC Use to inject insulin 4 times daily as directed 04/07/21   Tyrone Nine, MD  metFORMIN (GLUCOPHAGE) 500 MG tablet Take 2 tablets (1,000 mg total) by mouth 2 (two) times daily with a meal. 04/07/21 05/07/21  Tyrone Nine, MD  ondansetron (ZOFRAN) 4 MG tablet Take 1 tablet (4 mg total) by mouth every 6 (six) hours as needed for up to 7 days for nausea or vomiting. 10/20/23 10/27/23  Trinna Post, MD    Physical Exam: Vitals:   10/22/23 1441 10/22/23 1445 10/22/23 1511 10/23/23 0018  BP:   128/76 (!) 152/93  Pulse: 79  78 (!) 110  Resp: 20 20 17 18   Temp:  (!) 97 F (36.1 C)  97.8 F (36.6 C)  TempSrc:      SpO2: 98%  91% 98%  Weight:      Height:         Intake/Output Summary (Last 24 hours) at 10/23/2023 0747 Last data filed at 10/22/2023 2227 Gross per 24 hour  Intake 1050 ml  Output 600 ml  Net 450 ml   Filed Weights   10/21/23 1355 10/22/23 0824  Weight: (!) 159.8 kg (!) 159.8 kg    Exam:  General:  Appears calm and comfortable and is in NAD Eyes:  EOMI, normal lids, iris ENT:  grossly normal hearing, lips & tongue, mmm Neck:  no LAD, masses or thyromegaly Cardiovascular:  RRR, no m/r/g. No LE edema.  Respiratory:   CTA bilaterally  with no wheezes/rales/rhonchi.  Normal respiratory effort. Abdomen:  soft, NT, ND Skin:  no rash or induration seen on limited exam Musculoskeletal:  L leg is splinted Psychiatric:  grossly normal mood and affect, speech fluent and appropriate, AOx3 Neurologic:  CN 2-12 grossly intact, moves all extremities in coordinated fashion  Data Reviewed: I have reviewed the patient's lab results since admission.  Pertinent labs for today include:   Na++ 133 K+ 3.4 Glucose 311 WBC 13.8    Family Communication: None present; she is capable of communicating with family at this time  Primary team communication: I discussed with Dr. Joice Lofts and will assume care at this time  Thank you very much for involving [place] in the care of your patient.  Author: Jonah Blue, MD 10/23/2023 7:46 AM  For on call review [email or web address].

## 2023-10-23 NOTE — Progress Notes (Addendum)
PHARMACY CONSULT NOTE - ELECTROLYTES  Pharmacy Consult for Electrolyte Monitoring and Replacement   Recent Labs: Height: 5\' 11"  (180.3 cm) Weight: (!) 159.8 kg (352 lb 4.7 oz) IBW/kg (Calculated) : 70.8 Estimated Creatinine Clearance: 180.6 mL/min (by C-G formula based on SCr of 0.75 mg/dL). Potassium (mmol/L)  Date Value  10/23/2023 3.4 (L)  06/15/2014 4.1   Magnesium (mg/dL)  Date Value  16/09/9603 1.8   Calcium (mg/dL)  Date Value  54/08/8118 8.6 (L)   Calcium, Total (mg/dL)  Date Value  14/78/2956 8.8 (L)   Albumin (g/dL)  Date Value  21/30/8657 3.4 (L)  06/15/2014 3.4 (L)   Sodium (mmol/L)  Date Value  10/23/2023 133 (L)  06/15/2014 140   Corrected Ca: 9.1 mg/dL  Assessment  Cassandra Flynn is a 27 y.o. female presenting with tib/fib fx. PMH significant for DM, bipolar, obesity, asthma, HTN. Pharmacy has been consulted to monitor and replace electrolytes.  Diet: carb modified, BG elevated MIVF:  none    Pertinent medications:    Goal of Therapy: Electrolytes WNL  Plan:  KCL 3.4   -Will order KCL 40 meq po x 1, Mag 1.8    -will order Magnesium 2 gm IV x 1 Check BMP with AM labs  Thank you for allowing pharmacy to be a part of this patient's care.  Angelique Blonder, PharmD Clinical Pharmacist 10/23/2023 1:12 PM

## 2023-10-24 ENCOUNTER — Telehealth (HOSPITAL_COMMUNITY): Payer: Self-pay | Admitting: Pharmacy Technician

## 2023-10-24 ENCOUNTER — Other Ambulatory Visit (HOSPITAL_COMMUNITY): Payer: Self-pay

## 2023-10-24 DIAGNOSIS — Z794 Long term (current) use of insulin: Secondary | ICD-10-CM

## 2023-10-24 DIAGNOSIS — G4733 Obstructive sleep apnea (adult) (pediatric): Secondary | ICD-10-CM | POA: Diagnosis not present

## 2023-10-24 DIAGNOSIS — S82202A Unspecified fracture of shaft of left tibia, initial encounter for closed fracture: Secondary | ICD-10-CM | POA: Diagnosis not present

## 2023-10-24 DIAGNOSIS — E1165 Type 2 diabetes mellitus with hyperglycemia: Secondary | ICD-10-CM | POA: Diagnosis not present

## 2023-10-24 DIAGNOSIS — F3164 Bipolar disorder, current episode mixed, severe, with psychotic features: Secondary | ICD-10-CM

## 2023-10-24 LAB — CBC WITH DIFFERENTIAL/PLATELET
Abs Immature Granulocytes: 0.04 10*3/uL (ref 0.00–0.07)
Basophils Absolute: 0 10*3/uL (ref 0.0–0.1)
Basophils Relative: 0 %
Eosinophils Absolute: 0.2 10*3/uL (ref 0.0–0.5)
Eosinophils Relative: 3 %
HCT: 36.5 % (ref 36.0–46.0)
Hemoglobin: 12.2 g/dL (ref 12.0–15.0)
Immature Granulocytes: 0 %
Lymphocytes Relative: 32 %
Lymphs Abs: 2.8 10*3/uL (ref 0.7–4.0)
MCH: 30.2 pg (ref 26.0–34.0)
MCHC: 33.4 g/dL (ref 30.0–36.0)
MCV: 90.3 fL (ref 80.0–100.0)
Monocytes Absolute: 0.6 10*3/uL (ref 0.1–1.0)
Monocytes Relative: 7 %
Neutro Abs: 5.2 10*3/uL (ref 1.7–7.7)
Neutrophils Relative %: 58 %
Platelets: 218 10*3/uL (ref 150–400)
RBC: 4.04 MIL/uL (ref 3.87–5.11)
RDW: 11.9 % (ref 11.5–15.5)
WBC: 9 10*3/uL (ref 4.0–10.5)
nRBC: 0 % (ref 0.0–0.2)

## 2023-10-24 LAB — GLUCOSE, CAPILLARY
Glucose-Capillary: 251 mg/dL — ABNORMAL HIGH (ref 70–99)
Glucose-Capillary: 282 mg/dL — ABNORMAL HIGH (ref 70–99)
Glucose-Capillary: 182 mg/dL — ABNORMAL HIGH (ref 70–99)
Glucose-Capillary: 223 mg/dL — ABNORMAL HIGH (ref 70–99)

## 2023-10-24 LAB — BASIC METABOLIC PANEL
Anion gap: 11 (ref 5–15)
BUN: 16 mg/dL (ref 6–20)
CO2: 24 mmol/L (ref 22–32)
Calcium: 8.5 mg/dL — ABNORMAL LOW (ref 8.9–10.3)
Chloride: 98 mmol/L (ref 98–111)
Creatinine, Ser: 0.63 mg/dL (ref 0.44–1.00)
GFR, Estimated: >60 mL/min (ref >60)
Glucose, Bld: 347 mg/dL — ABNORMAL HIGH (ref 70–99)
Potassium: 4.1 mmol/L (ref 3.5–5.1)
Sodium: 133 mmol/L — ABNORMAL LOW (ref 135–145)

## 2023-10-24 NOTE — Inpatient Diabetes Management (Addendum)
Inpatient Diabetes Program Recommendations  AACE/ADA: New Consensus Statement on Inpatient Glycemic Control (2015)  Target Ranges:  Prepandial:   less than 140 mg/dL      Peak postprandial:   less than 180 mg/dL (1-2 hours)      Critically ill patients:  140 - 180 mg/dL    Latest Reference Range & Units 10/23/23 08:15 10/23/23 12:06 10/23/23 17:12 10/23/23 20:53  Glucose-Capillary 70 - 99 mg/dL 102 (H)  8 units Novolog  289 (H)  8 units Novolog  55 units Semglee  288 (H)  8 units Novolog  296 (H)  (H): Data is abnormally high  Latest Reference Range & Units 10/24/23 07:50  Glucose-Capillary 70 - 99 mg/dL 725 (H)  8 units Novolog  55 units Semglee  (H): Data is abnormally high   Admit with: L Tibia Fracture/ L Fibular Fracture after Fall   History: DM   Home DM Meds: Lantus 55 units Daily     Metformin 1000 mg bid     Novolog 20 units TID     Novolog SSI (0-15 units)   Current Orders: Novolog Moderate Correction Scale/ SSI (0-15 units) TID AC                            Semglee 55 units Daily      Got Decadron on 11/11 for Surgery  Semglee added 11/12    MD- Note CBG 251 this AM.  Semglee started yest.  Please consider:  1. Increase Semglee to 60 units Daily Please give an extra 5 units Semglee X 1 dose this AM since 55 unit dose already given  2. Start Novolog Meal Coverage: Novolog 6 units TID with meals  HOLD if pt NPO HOLD if pt eats <50% meals    Addendum 11:05am--Met w/ pt at bedside today.  Pt up in chair after working w/ PT and feeling much better today.  Pt told me she sees PCP at [facility]--Gets her Meds thru the Clinic.  Pt told me her PCP at the clinic stopped her insulin about 6 mos ago--She is unsure why?  Had been taking Insulin for over a year prior to stopping.  Did continue the Metformin.  Has CBG meter at home but only checking BID AC Bfast and Dinner.  Stated to me her PCP wants her to check more often.    Spoke with  patient about her current A1c of 11.2%.  Explained what an A1c is and what it measures.  Reminded patient that her goal A1c is 7% or less per ADA standards to prevent both acute and long-term complications.  Explained to patient the extreme importance of good glucose control at home especially to help her heal after Surgery and to prevent long term complications with her organs.  Reviewed goal CBGs for home.  Encouraged patient to check her CBGs at least TID [facility] and to record all CBGs in a logbook for her to review.  Discussed with pt that given her A1c, the discharging MD may want to d/c her back home on insulin.  Pt is OK with this plan and was instructed that we may likely have to change the doses she was taking 6 mos ago but that we will keep the insulins the same as best we can.  Pt has Lantus and Novolog at home and has CBG meter supplies at home.      --Will follow patient during hospitalization--  Doniel Maiello  Tawanna Sat RN, MSN, CDCES Diabetes Coordinator Inpatient Glycemic Control Team Team Pager: [phone] (8a-5p)

## 2023-10-24 NOTE — TOC Progression Note (Signed)
Transition of Care [facility]) - Progression Note    Patient Details  Name: Cassandra Flynn MRN: [id number] Date of Birth: 13-Dec-1995  Transition of Care [facility]) CM/SW Contact  Marlowe Sax, RN Phone Number: 10/24/2023, 3:31 PM  Clinical Narrative:     Received a call from [facility]  case manager Amy Body, she will email  a list to me for STR and Home health agencies that they work with.  I explained this has been an ongoing issue for patients with this insurance that none of the local agencies or SNF will accept the patient when they have this insurance, looking for the email to come thru for the list  Expected Discharge Plan: OP Rehab Barriers to Discharge: Continued Medical Work up  Expected Discharge Plan and Services   Discharge Planning Services: CM Consult   Living arrangements for the past 2 months: Single Family Home                 DME Arranged: Walker rolling, 3-N-1 DME Agency: [employer] Date DME Agency Contacted: 10/24/23 Time DME Agency Contacted: [phone] Representative spoke with at DME Agency: Osvaldo Angst Arranged: NA           Social Determinants of Health (SDOH) Interventions SDOH Screenings   Food Insecurity: No Food Insecurity (10/21/2023)  Housing: Low Risk  (10/21/2023)  Transportation Needs: No Transportation Needs (10/21/2023)  Utilities: Not At Risk (10/21/2023)  Tobacco Use: High Risk (10/22/2023)    Readmission Risk Interventions     No data to display

## 2023-10-24 NOTE — TOC Benefit Eligibility Note (Signed)
Patient Product/process development scientist completed.    The patient is insured through [employer] [place].     Ran test claim for Dexcom G7 Sensor and Requires Prior Authorization  Ran test claim for [employer] 3 Sensor and Requires Prior Authorization  This test claim was processed through [employer]- copay amounts may vary at other pharmacies due to [employer], or as the patient moves through the different stages of their insurance plan.     Roland Earl, CPHT Pharmacy Technician III Certified Patient Advocate [facility] Patient Advocate Team Direct Number: [phone]  Fax: [phone]

## 2023-10-24 NOTE — Progress Notes (Signed)
Patient is not able to walk the distance required to go the bathroom, or he/she is unable to safely negotiate stairs required to access the bathroom.  A 3in1 BSC will alleviate this problem  

## 2023-10-24 NOTE — Telephone Encounter (Signed)
Pharmacy Patient Advocate Encounter  Received notification from [facility] that Prior Authorization for FreeStyle Libre 3 Sensor  has been APPROVED from 10/24/2023 to 04/21/2024. Ran test claim, Copay is $0.00. This test claim was processed through [facility]- copay amounts may vary at other pharmacies due to pharmacy/plan contracts, or as the patient moves through the different stages of their insurance plan.   PA #/Case ID/Reference #: [id number]

## 2023-10-24 NOTE — Telephone Encounter (Signed)
Pharmacy Patient Advocate Encounter   Received notification that prior authorization for FreeStyle Libre 3 Sensoris required/requested.   Insurance verification completed.   The patient is insured through [employer] [place] .   Per test claim: PA required; PA submitted to above mentioned insurance via CoverMyMeds Key/confirmation #/EOC [id number] Status is pending

## 2023-10-24 NOTE — Progress Notes (Signed)
Physical Therapy Treatment Patient Details Name: Cassandra Flynn MRN: [id number] DOB: Jul 25, 1996 Today's Date: 10/24/2023   History of Present Illness Pt is 27 y/o admitted 10/21/23 for closed fx of the left tibia and fibula. ORIF fibula fx procedure performed 10/22/23.    PT Comments  Pt received in bed and agreed to PT session. Pt reports pain to be a on a scale of 7/10. Pt performed bed mobility ModI with the use of BUE to mobilize LLE, STS with the use of RW (2wheels) CGA, amb to hallway with RW CGA, and completed stair negotiation CGA. Pt reports an increase in pain to a scale of 10/10 and requested pain medications following session, RN notified. Pt tolerated Tx well today and will continue to benefit from skilled PT sessions to improve strength, activity tolerance, and functional mobility to maximize safety/IND following D/C.   If plan is discharge home, recommend the following: Assist for transportation;Help with stairs or ramp for entrance;A little help with walking and/or transfers   Can travel by private vehicle        Equipment Recommendations  Rolling walker (2 wheels);BSC/3in1    Recommendations for Other Services       Precautions / Restrictions Precautions Precautions: Fall Restrictions Weight Bearing Restrictions: Yes LLE Weight Bearing: Non weight bearing     Mobility  Bed Mobility Overal bed mobility: Modified Independent             General bed mobility comments: Pt performs bed mobility ModI via using their arms to assist their LLE with mobility.    Transfers Overall transfer level: Needs assistance Equipment used: Rolling walker (2 wheels) Transfers: Sit to/from Stand Sit to Stand: Contact guard assist           General transfer comment: Pt required CGA for STS with the use of RW (2wheels) while maintaining WB precautions. VC necessary for safe/efficient technique.    Ambulation/Gait Ambulation/Gait assistance: Contact guard  assist Gait Distance (Feet): 20 Feet Assistive device: Rolling walker (2 wheels) Gait Pattern/deviations: Step-to pattern, Antalgic Gait velocity: decreased, cautious General Gait Details: Pt amb to hallway with the use of RW (2wheels) CGA. Pt did not report any s/sx relative to dizziness however, pt experiences pain during mobility.   Stairs Stairs: Yes Stairs assistance: Contact guard assist Stair Management: No rails, Backwards, Forwards, With walker Number of Stairs: 1 General stair comments: Pt performed stair negotiation CGA. Pt demonstrated and expressed understanding/comfortability with stair negotiation while using RW (2wheels) following D/C.   Wheelchair Mobility     Tilt Bed    Modified Rankin (Stroke Patients Only)       Balance Overall balance assessment: Needs assistance Sitting-balance support: Feet supported Sitting balance-Leahy Scale: Normal     Standing balance support: Bilateral upper extremity supported, During functional activity, Reliant on assistive device for balance Standing balance-Leahy Scale: Good Standing balance comment: CGA for saftey                            Cognition Arousal: Alert Behavior During Therapy: WFL for tasks assessed/performed Overall Cognitive Status: Within Functional Limits for tasks assessed                                 General Comments: AOx4. Pt pleasant and willing to participate in PT session.        Exercises      General  Comments        Pertinent Vitals/Pain Pain Assessment Pain Assessment: 0-10 Pain Score: 7  Pain Location: LLE Pain Descriptors / Indicators: Burning, Sharp, Constant, Throbbing Pain Intervention(s): Monitored during session, Limited activity within patient's tolerance, Patient requesting pain meds-RN notified    Home Living                          Prior Function            PT Goals (current goals can now be found in the care plan  section) Acute Rehab PT Goals Patient Stated Goal: To decrease pain PT Goal Formulation: With patient Time For Goal Achievement: 11/06/23 Potential to Achieve Goals: Good Progress towards PT goals: Progressing toward goals    Frequency    7X/week      PT Plan      Co-evaluation              AM-PAC PT "6 Clicks" Mobility   Outcome Measure  Help needed turning from your back to your side while in a flat bed without using bedrails?: None Help needed moving from lying on your back to sitting on the side of a flat bed without using bedrails?: A Little Help needed moving to and from a bed to a chair (including a wheelchair)?: A Little Help needed standing up from a chair using your arms (e.g., wheelchair or bedside chair)?: A Little Help needed to walk in hospital room?: A Little Help needed climbing 3-5 steps with a railing? : A Lot 6 Click Score: 18    End of Session Equipment Utilized During Treatment: Gait belt Activity Tolerance: Patient tolerated treatment well;Patient limited by pain Patient left: in chair;with call bell/phone within reach Nurse Communication: Mobility status;Patient requests pain meds PT Visit Diagnosis: Other abnormalities of gait and mobility (R26.89);Muscle weakness (generalized) (M62.81);Pain Pain - Right/Left: Left Pain - part of body: Leg;Ankle and joints of foot     Time: 0921-0950 PT Time Calculation (min) (ACUTE ONLY): 29 min  Charges:                            Marily Konczal Sauvignon Howard SPT, LAT, [name]   Kally Cadden Sauvignon-Howard 10/24/2023, 10:49 AM

## 2023-10-24 NOTE — Progress Notes (Addendum)
Subjective: 2 Days Post-Op Procedure(s) (LRB): INTRAMEDULLARY (IM) NAIL TIBIAL (Left) OPEN REDUCTION INTERNAL FIXATION (ORIF) FIBULA FRACTURE Patient reports pain as moderate in the leg but patient is sleeping when I entered the room. Patient is  well but does report moderate pain in the left leg. PT and care management to assist with discharge planning Negative for chest pain and shortness of breath Fever: no Gastrointestinal:Negative for nausea and vomiting Reports that she is passing gas this morning.  Objective: Vital signs in last 24 hours: Temp:  [97.8 F (36.6 C)-98.5 F (36.9 C)] 98.5 F (36.9 C) (11/12 2331) Pulse Rate:  [93-105] 105 (11/12 2331) Resp:  [18-19] 18 (11/12 2331) BP: (135-161)/(89-96) 161/96 (11/12 2331) SpO2:  [97 %-100 %] 98 % (11/12 2331)  Intake/Output from previous day:  Intake/Output Summary (Last 24 hours) at 10/24/2023 0733 Last data filed at 10/23/2023 1800 Gross per 24 hour  Intake 600 ml  Output 1000 ml  Net -400 ml    Intake/Output this shift: No intake/output data recorded.  Labs: Recent Labs    10/21/23 1405 10/23/23 0927  HGB 14.2 12.9   Recent Labs    10/21/23 1405 10/23/23 0927  WBC 12.2* 13.8*  RBC 4.71 4.27  HCT 41.1 37.4  PLT 282 241   Recent Labs    10/21/23 1405 10/23/23 0927  NA 132* 133*  K 4.4 3.4*  CL 99 99  CO2 20* 24  BUN 11 15  CREATININE 0.93 0.75  GLUCOSE 324* 311*  CALCIUM 9.0 8.6*   No results for input(s): "LABPT", "INR" in the last 72 hours.  EXAM General - Patient is Alert, Appropriate, and Oriented Abdomen soft with intact bowel sounds. Splint is intact to the left leg this morning.  ACE wraps without loosening. She is able to full flex and extend her toes, cap refill intact. Reports decreased sensation to light touch to the foot but intact pulse to the left foot.  Past Medical History:  Diagnosis Date   Asthma    Diabetes mellitus without complication (HCC)    Diabetic  ketoacidosis (HCC) 06/22/2019   Hypertension    Sleep apnea    Assessment/Plan: 2 Days Post-Op Procedure(s) (LRB): INTRAMEDULLARY (IM) NAIL TIBIAL (Left) OPEN REDUCTION INTERNAL FIXATION (ORIF) FIBULA FRACTURE Principal Problem:   Fracture of left tibia and fibula, closed, initial encounter Active Problems:   Severe bipolar I disorder, most recent episode mixed, with psychotic features (HCC)   Diabetes (HCC)   OSA (obstructive sleep apnea)  Estimated body mass index is 49.14 kg/m as calculated from the following:   Height as of this encounter: 5\' 11"  (1.803 m).   Weight as of this encounter: 159.8 kg. Advance diet Up with therapy D/C IV fluids when tolerating po intake.  Vitals reviewed this AM, BP 105 this AM. Patient is no on the internal medicine service. Reports decreased sensation, but is able to flex and extend toes without issue. Splint intact to the left leg. We discussed the significant importance of remaining non-weightbearing to the left leg. Up with PT today. Will plan on keeping her in splint for 10-14 days before likely transitioning to a short leg cast. Following discharge, continue aspirin 325mg  BID for DVT prophylaxis.  DVT Prophylaxis - Aspirin Non-weightbearing to the left leg.  Valeria Batman, PA-C [facility] Orthopaedic Surgery 10/24/2023, 7:33 AM    Addendum: The patient has been examined.  Findings are as described above.  I agree with the proposed orthopedic plan as outlined above.  Maryagnes Amos, MD [facility] Orthopaedic Surgery 10/24/2023, 8:03 AM

## 2023-10-24 NOTE — TOC Progression Note (Signed)
Transition of Care [facility]) - Progression Note    Patient Details  Name: Genae Lemberger MRN: [id number] Date of Birth: 04-10-1996  Transition of Care [facility]) CM/SW Contact  Marlowe Sax, RN Phone Number: 10/24/2023, 11:29 AM  Clinical Narrative:    Unable to locate any [facility] agency that is accepting her Ins, I notified the physician that she will need to go to Outpatient PT,bariatric RW and 3 in1 to be delivered to the bedside by [employer] She lives at home with her sister and niece  Expected Discharge Plan: OP Rehab Barriers to Discharge: Continued Medical Work up  Expected Discharge Plan and Services   Discharge Planning Services: CM Consult   Living arrangements for the past 2 months: Single Family Home                 DME Arranged: Dan Humphreys rolling, 3-N-1 DME Agency: [employer] Date DME Agency Contacted: 10/24/23 Time DME Agency Contacted: [phone] Representative spoke with at DME Agency: Osvaldo Angst Arranged: NA           Social Determinants of Health (SDOH) Interventions SDOH Screenings   Food Insecurity: No Food Insecurity (10/21/2023)  Housing: Low Risk  (10/21/2023)  Transportation Needs: No Transportation Needs (10/21/2023)  Utilities: Not At Risk (10/21/2023)  Tobacco Use: High Risk (10/22/2023)    Readmission Risk Interventions     No data to display

## 2023-10-24 NOTE — Plan of Care (Signed)

## 2023-10-24 NOTE — Progress Notes (Signed)
Progress Note   Patient: Cassandra Flynn DOB: 07-25-1996 DOA: 10/21/2023     3 DOS: the patient was seen and examined on 10/24/2023   Brief hospital course: Cassandra Flynn is an 27 y.o. female with medical history significant for morbid obesity, non-insulin-dependent diabetes mellitus poorly controlled diabetes with a glucosuria, allergy to penicillin and asthma, history of multifocal COVID-pneumonia admitted to orthopedic service for left ankle fracture yesterday came to the emergency room for severe pain with bearing weight difficulty with ambulation.  Patient underwent open reduction internal fixation of the fibula fracture and intramedullary nail for her tibial fracture. Patient is postop and consult requested for medical management.   Assessment and Plan: L tib/fib fracture S/p IM nailing PT advised STR. TOC working with insurance for rehab placement. Encourage OOB when possible, NWB left lower extremity.   DM A1c is 11.2 - very poor control hold Glucophage Resume glargine home dose 55 units. Cover with moderate-scale SSI  DM coordinator on board, advised Freestyle Libre 3 glucose sensors.    Bipolar d/o No apparent home meds   OSA Continue CPAP   Morbid obesity Body mass index is 49.14 kg/m.Marland Kitchen  Weight loss should be encouraged Outpatient PCP/bariatric medicine/bariatric surgery f/u encouraged         Out of bed to chair. Incentive spirometry. Nursing supportive care. Fall, aspiration precautions. DVT prophylaxis   Code Status: Full Code  Subjective: Patient is seen and examined today morning. She is sitting in chair, NWB on left lower extremity. Blood sugars high.  Physical Exam: Vitals:   10/23/23 1549 10/23/23 2331 10/24/23 0751 10/24/23 1505  BP: (!) 142/89 (!) 161/96 (!) 133/95 131/85  Pulse: 93 (!) 105 94 91  Resp: 19 18 16 16   Temp: 97.8 F (36.6 C) 98.5 F (36.9 C) 97.6 F (36.4 C) 98.4 F (36.9 C)  TempSrc:      SpO2: 97%  98% 100% 100%  Weight:      Height:        General -  Young  morbidly obese African American female, no apparent distress HEENT - PERRLA, EOMI, atraumatic head, non tender sinuses. Lung - distant breath sounds, basal rales, rhonchi, no wheezes. Heart - S1, S2 heard, no murmurs, rubs, trace pedal edema. Abdomen - Soft, non tender, bowel sounds good Neuro - Alert, awake and oriented x 3, non focal exam. Skin - Warm and dry, left leg cast, dressing noted.  Data Reviewed:      Latest Ref Rng & Units 10/24/2023    1:16 PM 10/23/2023    9:27 AM 10/21/2023    2:05 PM  CBC  WBC 4.0 - 10.5 K/uL 9.0  13.8  12.2   Hemoglobin 12.0 - 15.0 g/dL 87.5  64.3  32.9   Hematocrit 36.0 - 46.0 % 36.5  37.4  41.1   Platelets 150 - 400 K/uL 218  241  282       Latest Ref Rng & Units 10/24/2023    1:16 PM 10/23/2023    9:27 AM 10/21/2023    2:05 PM  BMP  Glucose 70 - 99 mg/dL 518  841  660   BUN 6 - 20 mg/dL 16  15  11    Creatinine 0.44 - 1.00 mg/dL 6.30  1.60  1.09   Sodium 135 - 145 mmol/L 133  133  132   Potassium 3.5 - 5.1 mmol/L 4.1  3.4  4.4   Chloride 98 - 111 mmol/L 98  99  99  CO2 22 - 32 mmol/L 24  24  20    Calcium 8.9 - 10.3 mg/dL 8.5  8.6  9.0    No results found.   Family Communication: Discussed with patient, she understand and agree. All questions answereed.    Disposition: Status is: Inpatient Remains inpatient appropriate because: safe disposition  Planned Discharge Destination: Rehab     Time spent: 40 minutes  Author: Marcelino Duster, MD 10/24/2023 4:59 PM Secure chat 7am to 7pm For on call review [email or web address].

## 2023-10-24 NOTE — Progress Notes (Signed)
PHARMACY CONSULT NOTE - ELECTROLYTES  Pharmacy Consult for Electrolyte Monitoring and Replacement   Recent Labs: Height: 5\' 11"  (180.3 cm) Weight: (!) 159.8 kg (352 lb 4.7 oz) IBW/kg (Calculated) : 70.8 Estimated Creatinine Clearance: 180.6 mL/min (by C-G formula based on SCr of 0.63 mg/dL). Potassium (mmol/L)  Date Value  10/24/2023 4.1  06/15/2014 4.1   Magnesium (mg/dL)  Date Value  16/09/9603 1.8   Calcium (mg/dL)  Date Value  54/08/8118 8.5 (L)   Calcium, Total (mg/dL)  Date Value  14/78/2956 8.8 (L)   Albumin (g/dL)  Date Value  21/30/8657 3.4 (L)  06/15/2014 3.4 (L)   Phosphorus (mg/dL)  Date Value  84/69/6295 3.2   Sodium (mmol/L)  Date Value  10/24/2023 133 (L)  06/15/2014 140   Corrected Ca: 9.1 mg/dL  Assessment  Cassandra Flynn is a 27 y.o. female presenting with tib/fib fx. PMH significant for DM, bipolar, obesity, asthma, HTN. Pharmacy has been consulted to monitor and replace electrolytes.  Diet: carb modified, BG elevated MIVF:  none    Pertinent medications:    Goal of Therapy: Electrolytes WNL  Plan:  No supplementation needed Check BMP with AM labs  Thank you for allowing pharmacy to be a part of this patient's care.  Merryl Hacker, PharmD Clinical Pharmacist 10/24/2023 2:09 PM

## 2023-10-25 DIAGNOSIS — S82202A Unspecified fracture of shaft of left tibia, initial encounter for closed fracture: Secondary | ICD-10-CM | POA: Diagnosis not present

## 2023-10-25 DIAGNOSIS — S82402A Unspecified fracture of shaft of left fibula, initial encounter for closed fracture: Secondary | ICD-10-CM | POA: Diagnosis not present

## 2023-10-25 LAB — BASIC METABOLIC PANEL
Anion gap: 11 (ref 5–15)
BUN: 13 mg/dL (ref 6–20)
CO2: 25 mmol/L (ref 22–32)
Calcium: 8.9 mg/dL (ref 8.9–10.3)
Chloride: 99 mmol/L (ref 98–111)
Creatinine, Ser: 0.56 mg/dL (ref 0.44–1.00)
GFR, Estimated: >60 mL/min (ref >60)
Glucose, Bld: 217 mg/dL — ABNORMAL HIGH (ref 70–99)
Potassium: 4 mmol/L (ref 3.5–5.1)
Sodium: 135 mmol/L (ref 135–145)

## 2023-10-25 LAB — GLUCOSE, CAPILLARY
Glucose-Capillary: 204 mg/dL — ABNORMAL HIGH (ref 70–99)
Glucose-Capillary: 261 mg/dL — ABNORMAL HIGH (ref 70–99)

## 2023-10-25 MED ORDER — ONDANSETRON 4 MG PO TBDP: 4.0000 mg | ORAL_TABLET | Freq: Four times a day (QID) | ORAL | 0 refills | Status: DC | PRN

## 2023-10-25 MED ORDER — LANTUS SOLOSTAR 100 UNIT/ML ~~LOC~~ SOPN: 55.0000 [IU] | PEN_INJECTOR | Freq: Every day | SUBCUTANEOUS | 0 refills | Status: AC

## 2023-10-25 MED ORDER — INSULIN ASPART 100 UNIT/ML FLEXPEN: 15.0000 [IU] | PEN_INJECTOR | Freq: Three times a day (TID) | SUBCUTANEOUS | 3 refills | Status: AC

## 2023-10-25 MED ORDER — ASPIRIN 325 MG PO TBEC: 325.0000 mg | DELAYED_RELEASE_TABLET | Freq: Two times a day (BID) | ORAL | 0 refills | Status: DC

## 2023-10-25 MED ORDER — INSULIN PEN NEEDLE 32G X 4 MM MISC: 0 refills | Status: AC

## 2023-10-25 MED ORDER — INSULIN ASPART 100 UNIT/ML FLEXPEN: 15.0000 [IU] | PEN_INJECTOR | Freq: Three times a day (TID) | SUBCUTANEOUS | 3 refills | Status: DC

## 2023-10-25 MED ORDER — OXYCODONE HCL 5 MG PO TABS: 5.0000 mg | ORAL_TABLET | ORAL | 0 refills | Status: DC | PRN

## 2023-10-25 MED ORDER — INSULIN GLARGINE-YFGN 100 UNIT/ML ~~LOC~~ SOLN
60.0000 [IU] | Freq: Every day | SUBCUTANEOUS | Status: DC
  Administered 2023-10-25: 60 [IU] via SUBCUTANEOUS
  Filled 2023-10-25: qty 0.6

## 2023-10-25 NOTE — Progress Notes (Signed)
Subjective: 3 Days Post-Op Procedure(s) (LRB): INTRAMEDULLARY (IM) NAIL TIBIAL (Left) OPEN REDUCTION INTERNAL FIXATION (ORIF) FIBULA FRACTURE Patient reports pain as moderate in the left leg but does feel that it is improving. Patient is  well but does report moderate pain in the left leg. PT and care management to assist with discharge planning Negative for chest pain and shortness of breath Fever: no Gastrointestinal:Negative for nausea and vomiting Reports that she is passing gas this morning.  Objective: Vital signs in last 24 hours: Temp:  [97.6 F (36.4 C)-98.8 F (37.1 C)] 98.8 F (37.1 C) (11/13 2327) Pulse Rate:  [90-94] 90 (11/13 2327) Resp:  [16-20] 20 (11/13 2327) BP: (131-149)/(85-95) 149/89 (11/13 2327) SpO2:  [98 %-100 %] 98 % (11/13 2327)  Intake/Output from previous day:  Intake/Output Summary (Last 24 hours) at 10/25/2023 0723 Last data filed at 10/24/2023 1500 Gross per 24 hour  Intake 120 ml  Output --  Net 120 ml    Intake/Output this shift: No intake/output data recorded.  Labs: Recent Labs    10/23/23 0927 10/24/23 1316  HGB 12.9 12.2   Recent Labs    10/23/23 0927 10/24/23 1316  WBC 13.8* 9.0  RBC 4.27 4.04  HCT 37.4 36.5  PLT 241 218   Recent Labs    10/23/23 0927 10/24/23 1316  NA 133* 133*  K 3.4* 4.1  CL 99 98  CO2 24 24  BUN 15 16  CREATININE 0.75 0.63  GLUCOSE 311* 347*  CALCIUM 8.6* 8.5*   No results for input(s): "LABPT", "INR" in the last 72 hours.  EXAM General - Patient is Alert, Appropriate, and Oriented Abdomen soft with intact bowel sounds. Splint is intact to the left leg this morning.  ACE wraps without loosening. Some mild bloody drainage noted to the lateral aspect of the splint, no active drainage or bleeding. She is able to full flex and extend her toes, cap refill intact.   Improved sensation to the left foot this morning.    Past Medical History:  Diagnosis Date   Asthma    Diabetes mellitus  without complication (HCC)    Diabetic ketoacidosis (HCC) 06/22/2019   Hypertension    Sleep apnea    Assessment/Plan: 3 Days Post-Op Procedure(s) (LRB): INTRAMEDULLARY (IM) NAIL TIBIAL (Left) OPEN REDUCTION INTERNAL FIXATION (ORIF) FIBULA FRACTURE Principal Problem:   Fracture of left tibia and fibula, closed, initial encounter Active Problems:   Severe bipolar I disorder, most recent episode mixed, with psychotic features (HCC)   Diabetes (HCC)   OSA (obstructive sleep apnea)  Estimated body mass index is 49.14 kg/m as calculated from the following:   Height as of this encounter: 5\' 11"  (1.803 m).   Weight as of this encounter: 159.8 kg. Advance diet Up with therapy D/C IV fluids when tolerating po intake.  Vitals reviewed this AM, BP 149/89.  WBC 9.0 Sensation has improved to the left foot this morning.  Cap refill intact to each toe. Splint intact to the left leg.  Mild bloody drainage to the lateral aspect, will keep dressing intact for now, no active drainage is noted. We discussed the significant importance of remaining non-weightbearing to the left leg. Up with PT today. Will plan on keeping her in splint for 10-14 days before likely transitioning to a short leg cast. Following discharge, continue aspirin 325mg  BID for DVT prophylaxis.  DVT Prophylaxis - Aspirin Non-weightbearing to the left leg.  Valeria Batman, PA-C [facility] Orthopaedic Surgery 10/25/2023, 7:23 AM

## 2023-10-25 NOTE — Inpatient Diabetes Management (Addendum)
Inpatient Diabetes Program Recommendations  AACE/ADA: New Consensus Statement on Inpatient Glycemic Control (2015)  Target Ranges:  Prepandial:   less than 140 mg/dL      Peak postprandial:   less than 180 mg/dL (1-2 hours)      Critically ill patients:  140 - 180 mg/dL    Latest Reference Range & Units 10/24/23 07:50 10/24/23 11:46 10/24/23 16:54 10/24/23 21:24  Glucose-Capillary 70 - 99 mg/dL 161 (H)  8 units Novolog  55 units Semglee  282 (H)  8 units Novolog  182 (H)  5 units Novolog  223 (H)  (H): Data is abnormally high  Latest Reference Range & Units 10/25/23 07:58  Glucose-Capillary 70 - 99 mg/dL 096 (H)  5 units Novolog   (H): Data is abnormally high  Admit with: L Tibia Fracture/ L Fibular Fracture after Fall   History: DM   Home DM Meds: Lantus 55 units Daily     Metformin 1000 mg bid     Novolog 20 units TID     Novolog SSI (0-15 units)   Current Orders: Novolog Moderate Correction Scale/ SSI (0-15 units) TID AC                            Semglee 60 units Daily    Note Semglee increased this AM  MD- Please also consider starting Novolog Meal Coverage: Novolog 6 units TID with meals HOLD if pt NPO HOLD if pt eats <50% meals  Will need to restart Insulin (Lantus and Novolog) at time of discharge    Addendum 11:25am--Met w/ pt at bedside again today.  Discussed w/ pt that she has been approved to get the [employer] 3 CGM for home for $0 co-pay.  Pt very excited to hear she can use this for home.  Helped pt to download the FSL3 app to her phone and explained how to go through the app for complete set up.  Explained to to pt how to apply 1st sensor at home and referred her to the d/c packet and video online on the FSL3 if she needed any help to apply the 1st sensor at  home.  Pt stated she is fairly confident she can apply the 1st sensor independently.  Reviewed Freestyle Libre 3 CGM system (how to use, how to place new sensor, sensor life,  troubleshooting, warm-up time, how to use app on phone, rotation of insertion sites, etc).  Pt knows she will able to scan for CBG 1 hr after sensor placed.  Pt instructed to replace sensor in 14 days.  Encouraged pt to monitor glucose pre and post meals.  Reviewed healthy glucose goals for home.  Pt asked to seek refills for the sensors from their PCP.  Pt educated to check fingerstick CBG with traditional CBG meter when glucose reading does not match how they feels.       --Will follow patient during hospitalization--  Ambrose Finland RN, MSN, CDCES Diabetes Coordinator Inpatient Glycemic Control Team Team Pager: [phone] (8a-5p)

## 2023-10-25 NOTE — Progress Notes (Signed)
Physical Therapy Treatment Patient Details Name: Cassandra Flynn MRN: [id number] DOB: 08-Aug-1996 Today's Date: 10/25/2023   History of Present Illness Pt is 27 y/o admitted 10/21/23 for closed fx of the left tibia and fibula. ORIF fibula fx procedure performed 10/22/23.    PT Comments  Pt received in bed and agreed to PT session. Pt reports pain scale to be 9/10, next dose of medications due an hour from the start of session. Pt performed bed mobility SUP, STS and amb with the use of RW (2wheels) CGA for safety. Pt presented with more confidence with mobility and demonstrates appropriate mobility/safety mechanics for D/C home. Communication via Secure Chat with team occurred to discuss pt D/C as they met all acute care PT goals. We will continue to work with pt while admitted.   If plan is discharge home, recommend the following: Assist for transportation;Help with stairs or ramp for entrance;A little help with walking and/or transfers   Can travel by private vehicle        Equipment Recommendations  Rolling walker (2 wheels);BSC/3in1    Recommendations for Other Services       Precautions / Restrictions Precautions Precautions: Fall Restrictions Weight Bearing Restrictions: Yes LLE Weight Bearing: Non weight bearing     Mobility  Bed Mobility Overal bed mobility: Modified Independent             General bed mobility comments: Pt performs bed mobility ModI via using their arms to assist their LLE with mobility.    Transfers Overall transfer level: Needs assistance Equipment used: Rolling walker (2 wheels) Transfers: Sit to/from Stand Sit to Stand: Contact guard assist           General transfer comment: Pt required CGA for STS with the use of RW (2wheels) while maintaining WB precautions.    Ambulation/Gait Ambulation/Gait assistance: Contact guard assist Gait Distance (Feet): 7 Feet Assistive device: Rolling walker (2 wheels) Gait Pattern/deviations:  Step-to pattern, Antalgic Gait velocity: cautious     General Gait Details: Pt amb in room and to the recliner with the use of RW (2wheels) CGA. Pt did not report any s/sx relative to dizziness.   Stairs             Wheelchair Mobility     Tilt Bed    Modified Rankin (Stroke Patients Only)       Balance Overall balance assessment: Needs assistance Sitting-balance support: Feet supported Sitting balance-Leahy Scale: Normal     Standing balance support: Bilateral upper extremity supported, During functional activity, Reliant on assistive device for balance Standing balance-Leahy Scale: Good Standing balance comment: CGA for saftey                            Cognition Arousal: Alert Behavior During Therapy: WFL for tasks assessed/performed Overall Cognitive Status: Within Functional Limits for tasks assessed                                 General Comments: AOx4. Pt pleasant and willing to participate in PT session.        Exercises      General Comments        Pertinent Vitals/Pain Pain Assessment Pain Assessment: 0-10 Pain Score: 9  Pain Location: LLE Pain Descriptors / Indicators: Burning, Sharp, Constant, Throbbing Pain Intervention(s): Monitored during session    Home Living  Prior Function            PT Goals (current goals can now be found in the care plan section) Acute Rehab PT Goals Patient Stated Goal: To decrease pain and to go home PT Goal Formulation: With patient Time For Goal Achievement: 11/06/23 Potential to Achieve Goals: Good Progress towards PT goals: Goals met/education completed, patient discharged from PT;Progressing toward goals    Frequency    7X/week      PT Plan      Co-evaluation              AM-PAC PT "6 Clicks" Mobility   Outcome Measure  Help needed turning from your back to your side while in a flat bed without using bedrails?:  None Help needed moving from lying on your back to sitting on the side of a flat bed without using bedrails?: A Little Help needed moving to and from a bed to a chair (including a wheelchair)?: A Little Help needed standing up from a chair using your arms (e.g., wheelchair or bedside chair)?: A Little Help needed to walk in hospital room?: A Little Help needed climbing 3-5 steps with a railing? : A Little 6 Click Score: 19    End of Session Equipment Utilized During Treatment: Gait belt Activity Tolerance: Patient tolerated treatment well Patient left: in chair;with call bell/phone within reach Nurse Communication: Mobility status PT Visit Diagnosis: Other abnormalities of gait and mobility (R26.89);Muscle weakness (generalized) (M62.81);Pain Pain - Right/Left: Left Pain - part of body: Leg;Ankle and joints of foot     Time: 0913-0925 PT Time Calculation (min) (ACUTE ONLY): 12 min  Charges:    $Gait Training: 8-22 mins PT General Charges $$ ACUTE PT VISIT: 1 Visit                     [name], LAT, [name]   [name] 10/25/2023, 11:43 AM

## 2023-10-25 NOTE — Plan of Care (Signed)

## 2023-10-25 NOTE — Progress Notes (Signed)
PHARMACY CONSULT NOTE - ELECTROLYTES  Pharmacy Consult for Electrolyte Monitoring and Replacement   Recent Labs: Height: 5\' 11"  (180.3 cm) Weight: (!) 159.8 kg (352 lb 4.7 oz) IBW/kg (Calculated) : 70.8 Estimated Creatinine Clearance: 180.6 mL/min (by C-G formula based on SCr of 0.56 mg/dL). Potassium (mmol/L)  Date Value  10/25/2023 4.0  06/15/2014 4.1   Magnesium (mg/dL)  Date Value  62/95/2841 1.8   Calcium (mg/dL)  Date Value  32/44/0102 8.9   Calcium, Total (mg/dL)  Date Value  72/53/6644 8.8 (L)   Albumin (g/dL)  Date Value  03/47/4259 3.4 (L)  06/15/2014 3.4 (L)   Phosphorus (mg/dL)  Date Value  56/38/7564 3.2   Sodium (mmol/L)  Date Value  10/25/2023 135  06/15/2014 140   Corrected Ca: 9.1 mg/dL  Assessment  Cassandra Flynn is a 27 y.o. female presenting with tib/fib fx. PMH significant for DM, bipolar, obesity, asthma, HTN. Pharmacy has been consulted to monitor and replace electrolytes.  Diet: carb modified, BG elevated MIVF:  none    Pertinent medications:    Goal of Therapy: Electrolytes WNL  Plan:  No supplementation needed. Labs have been stable for multiple days. Pharmacy will sign off of electrolyte replacement and continue to monitor peripherally Thank you for allowing pharmacy to be a part of this patient's care.  Elliot Gurney, PharmD, BCPS Clinical Pharmacist  10/25/2023 9:02 AM

## 2023-10-25 NOTE — Discharge Summary (Signed)
Physician Discharge Summary   Patient: Cassandra Flynn MRN: [id number] DOB: [birth year]  Admit date:     10/21/2023  Discharge date: 10/25/23  Discharge Physician: Cassandra Flynn   PCP: [facility]   Recommendations at discharge:   PCP follow up for diabetes management. Restarted Insulin regimen. Orthopedic follow up as scheduled.  Discharge Diagnoses: Principal Problem:   Fracture of left tibia and fibula, closed, initial encounter Active Problems:   Diabetes (HCC)   Severe bipolar I disorder, most recent episode mixed, with psychotic features (HCC)   OSA (obstructive sleep apnea)  Resolved Problems:   * No resolved hospital problems. *  Hospital Course: Cassandra Flynn is an 27 y.o. female with medical history significant for morbid obesity, non-insulin-dependent diabetes mellitus poorly controlled diabetes with a glucosuria, allergy to penicillin and asthma, history of multifocal COVID-pneumonia admitted to orthopedic service for left ankle fracture yesterday came to the emergency room for severe pain with bearing weight difficulty with ambulation.  Patient underwent open reduction internal fixation of the fibula fracture and intramedullary nail for her tibial fracture. Patient is postop and consult requested for medical management.    Assessment and Plan: L tib/fib fracture S/p IM nailing by ortho team. NWB status for left lower extremity. PT/ OT worked with her, she is safe to be discharged home Bedside commode, rolling walker ordered. Follow up with ortho as scheduled.   Uncontrolled type 2 diabetes mellitus A1c is 11.2 - very poor control Blood sugars around 250. She was on glargine home dose 55 units prior but not taking since last 6 months. Resumed 55 units Lantus. Advised to continue 15 units Aspart along with moderate-scale SSI. Advised Freestyle Libre 3 glucose sensors and keep a log of blood sugars for PCP to adjust insulin  dose. She understands and agrees.   Bipolar d/o No apparent home meds   OSA Continue CPAP   Morbid obesity Body mass index is 49.14 kg/m.Marland Kitchen  Weight loss should be encouraged Outpatient PCP/bariatric medicine/bariatric surgery f/u encouraged      Consultants: Medical service. Procedures performed: s/p IM nailing for left tib/ fib fracture. Disposition: Home Diet recommendation:  Discharge Diet Orders (From admission, onward)     Start     Ordered   10/25/23 0000  Diet - low sodium heart healthy        10/25/23 1252           Cardiac and Carb modified diet DISCHARGE MEDICATION: Allergies as of 10/25/2023       Reactions   Penicillins Itching, Swelling, Rash   Has patient had a PCN reaction causing immediate rash, facial/tongue/throat swelling, SOB or lightheadedness with hypotension: Unknown Has patient had a PCN reaction causing severe rash involving mucus membranes or skin necrosis: Unknown Has patient had a PCN reaction that required hospitalization: Unknown Has patient had a PCN reaction occurring within the last 10 years: Unknown If all of the above answers are "NO", then may proceed with Cephalosporin use.        Medication List     STOP taking these medications    dextromethorphan-guaiFENesin 30-600 MG 12hr tablet Commonly known as: MUCINEX DM   HYDROcodone-acetaminophen 5-325 MG tablet Commonly known as: Norco   ondansetron 4 MG tablet Commonly known as: Zofran       TAKE these medications    albuterol 108 (90 Base) MCG/ACT inhaler Commonly known as: VENTOLIN HFA Inhale 2 puffs into the lungs every 6 (six) hours as needed for  wheezing or shortness of breath.   aspirin EC 325 MG tablet Take 1 tablet (325 mg total) by mouth 2 (two) times daily with a meal.   blood glucose meter kit and supplies Kit Dispense based on patient and insurance preference. Use up to four times daily as directed.   insulin aspart 100 UNIT/ML FlexPen Commonly  known as: NOVOLOG Inject 15 Units into the skin 3 (three) times daily with meals. 15 units with meals with additional sliding scale dose based on blood sugar (0-15 units) as below - CBG 70 - 120: 0 units, CBG 121 - 150: 2 units, CBG 151 - 200: 3 units, CBG 201 - 250: 5 units, CBG 251 - 300: 8 units, CBG 301 - 350: 11 units, CBG 351 - 400: 15 units, CBG <70 or > 400 call MD What changed:  how much to take additional instructions   Insulin Pen Needle 32G X 4 MM Misc Use to inject insulin 4 times daily as directed   Lantus SoloStar 100 UNIT/ML Solostar Pen Generic drug: insulin glargine Inject 55 Units into the skin daily.   metFORMIN 500 MG tablet Commonly known as: GLUCOPHAGE Take 2 tablets (1,000 mg total) by mouth 2 (two) times daily with a meal.   ondansetron 4 MG disintegrating tablet Commonly known as: ZOFRAN-ODT Take 1 tablet (4 mg total) by mouth every 6 (six) hours as needed for nausea or vomiting.   oxyCODONE 5 MG immediate release tablet Commonly known as: Oxy IR/ROXICODONE Take 1-2 tablets (5-10 mg total) by mouth every 4 (four) hours as needed for moderate pain (pain score 4-6) or severe pain (pain score 7-10).               Durable Medical Equipment  (From admission, onward)           Start     Ordered   10/24/23 1123  For home use only DME Walker rolling  Once       Comments: Bariatric rolling walker needed  Question Answer Comment  Walker: With 5 Inch Wheels   Patient needs a walker to treat with the following condition Impaired mobility      10/24/23 1123   10/24/23 1123  For home use only DME Bedside commode  Once       Comments: Bariatric  Question:  Patient needs a bedside commode to treat with the following condition  Answer:  Impaired mobility   10/24/23 1123            Follow-up Information     Anson Oregon, PA-C Follow up in 14 day(s).   Specialty: Physician Assistant Why: Staple removal and x-rays. Contact information: [address] [place] [address] [phone]         [facility] Follow up in 1 week(s).   Specialty: General Practice Contact information: [address] [place] [address] [phone]                Discharge Exam: Ceasar Mons Weights   10/21/23 1355 10/22/23 0824  Weight: (!) 159.8 kg (!) 159.8 kg   General -  Young  morbidly obese African American female, no apparent distress HEENT - PERRLA, EOMI, atraumatic head, non tender sinuses. Lung - distant breath sounds, basal rales, rhonchi, no wheezes. Heart - S1, S2 heard, no murmurs, rubs, trace pedal edema. Abdomen - Soft, non tender, bowel sounds good Neuro - Alert, awake and oriented x 3, non focal exam. Skin -  Warm and dry, left leg cast, dressing noted.    Condition at discharge: stable  The results of significant diagnostics from this hospitalization (including imaging, microbiology, ancillary and laboratory) are listed below for reference.   Imaging Studies: DG Tibia/Fibula Left  Result Date: 10/22/2023 CLINICAL DATA:  Elective surgery. EXAM: LEFT TIBIA AND FIBULA - 2 VIEW COMPARISON:  Preoperative imaging. FINDINGS: Four fluoroscopic spot views of the left tibia and fibula obtained in the operating room. Tibial intramedullary nail with proximal and distal locking screw fixation traversing distal fibular fracture. Lateral plate and screw fixation of distal fibular fracture. Fluoroscopy time 2 minutes 39 seconds. Dose 11.73 mGy. IMPRESSION: Intraoperative fluoroscopy during tibial and fibular fracture fixation. Electronically Signed   By: Narda Rutherford M.D.   On: 10/22/2023 15:47   DG C-Arm 1-60 Min-No Report  Result Date: 10/22/2023 Fluoroscopy was utilized by the requesting physician.  No radiographic interpretation.   DG C-Arm 1-60 Min-No Report  Result Date: 10/22/2023 Fluoroscopy was utilized by the requesting physician.  No radiographic interpretation.    DG C-Arm 1-60 Min-No Report  Result Date: 10/22/2023 Fluoroscopy was utilized by the requesting physician.  No radiographic interpretation.   [place] OR NERVE BLOCK-IMAGE ONLY [facility])  Result Date: 10/22/2023 There is no interpretation for this exam.  This order is for images obtained during a surgical procedure.  Please See "Surgeries" Tab for more information regarding the procedure.   DG Ankle Left Port  Result Date: 10/21/2023 CLINICAL DATA:  Recent fracture.  Increased pain. EXAM: PORTABLE LEFT ANKLE - 2 VIEW COMPARISON:  Left ankle radiographs dated October 20, 2023. FINDINGS: Again seen is an oblique fracture of the distal tibial diaphysis with approximately 11 mm of medial displacement of the distal fracture segment, compared to 8 mm previously, as well as 7 mm of anterior displacement, which is new since the prior exam. There is approximately 10 degrees of apex anterior angulation, which is new since the prior exam. Similar comminuted fracture of the left distal fibula with a proximally 5 mm of medial displacement of the distal fragment. No additional fracture identified. No evidence of dislocation. Diffuse soft tissue of the visualized lower extremity. IMPRESSION: 1. Increased anteromedial displacement and new mild anterior angulation of the oblique distal tibial fracture. 2. Similar comminuted mildly displaced fracture of the left distal fibula. Electronically Signed   By: Hart Robinsons M.D.   On: 10/21/2023 16:11   DG Ankle 2 Views Left  Result Date: 10/20/2023 CLINICAL DATA:  Post reduction images. EXAM: LEFT ANKLE - 2 VIEW COMPARISON:  None Available. FINDINGS: The left ankle was imaged in a fiberglass cast with subsequently obscured osseous and soft tissue detail. Acute fracture deformities are seen involving the shafts of the distal left tibia and distal left fibula, with gross anatomic alignment. There is no evidence of dislocation. Moderate severity diffuse soft tissue swelling is  noted. IMPRESSION: Status post reduction of acute fractures of the distal left tibia and distal left fibula, with gross anatomic alignment. Electronically Signed   By: Aram Candela M.D.   On: 10/20/2023 03:30   DG Tibia/Fibula Left  Result Date: 10/20/2023 CLINICAL DATA:  Status post fall. EXAM: LEFT TIBIA AND FIBULA - 2 VIEW COMPARISON:  None Available. FINDINGS: Acute fracture deformities are seen involving the distal shafts of the left tibia and left fibula. There is anterior angulation of the fracture apices, without evidence of dislocation. Moderate severity diffuse soft tissue swelling is noted. IMPRESSION: Acute fractures of the distal shafts of  the left tibia and left fibula. Electronically Signed   By: Aram Candela M.D.   On: 10/20/2023 03:28   DG Foot 2 Views Left  Result Date: 10/20/2023 CLINICAL DATA:  Status post fall. EXAM: LEFT FOOT - 2 VIEW COMPARISON:  None Available. FINDINGS: Acute fracture deformities are seen involving the distal shafts of the left tibia and left fibula. There is no evidence of dislocation. There is no evidence of arthropathy or other focal bone abnormality. Soft tissue swelling is seen along the previously noted fracture sites. IMPRESSION: Acute fractures of the distal shafts of the left tibia and left fibula. Electronically Signed   By: Aram Candela M.D.   On: 10/20/2023 03:28   DG Ankle Complete Left  Result Date: 10/20/2023 CLINICAL DATA:  Status post fall. EXAM: LEFT ANKLE COMPLETE - 3+ VIEW COMPARISON:  None Available. FINDINGS: Acute fracture deformities are seen involving the distal shafts of the left tibia and left fibula. Extension to involve the portion of the left lateral malleolus is noted. Anterior angulation of the fracture apices is seen, without evidence of dislocation. Marked severity diffuse soft tissue swelling is present. IMPRESSION: Acute fractures of the distal left tibia and left fibula. Electronically Signed   By: Aram Candela M.D.   On: 10/20/2023 03:26    Microbiology: Results for orders placed or performed during the hospital encounter of 10/21/23  Surgical PCR screen     Status: None   Collection Time: 10/22/23  3:32 AM   Specimen: Nasal Mucosa; Nasal Swab  Result Value Ref Range Status   MRSA, PCR NEGATIVE NEGATIVE Final   Staphylococcus aureus NEGATIVE NEGATIVE Final    Comment: (NOTE) The Xpert SA Assay (FDA approved for NASAL specimens in patients 8 years of age and older), is one component of a comprehensive surveillance program. It is not intended to diagnose infection nor to guide or monitor treatment. Performed at [facility], [address] [place], [place] [address]     Labs: CBC: Recent Labs  Lab 10/21/23 1405 10/23/23 0927 10/24/23 1316  WBC 12.2* 13.8* 9.0  NEUTROABS  --  8.7* 5.2  HGB 14.2 12.9 12.2  HCT 41.1 37.4 36.5  MCV 87.3 87.6 90.3  PLT 282 241 218   Basic Metabolic Panel: Recent Labs  Lab 10/21/23 1405 10/23/23 0927 10/24/23 1316 10/25/23 0822  NA 132* 133* 133* 135  K 4.4 3.4* 4.1 4.0  CL 99 99 98 99  CO2 20* 24 24 25   GLUCOSE 324* 311* 347* 217*  BUN 11 15 16 13   CREATININE 0.93 0.75 0.63 0.56  CALCIUM 9.0 8.6* 8.5* 8.9  MG  --  1.8  --   --   PHOS  --  3.2  --   --    Liver Function Tests: Recent Labs  Lab 10/22/23 1931  AST 115*  ALT 54*  ALKPHOS 90  BILITOT 0.6  PROT 7.2  ALBUMIN 3.4*   CBG: Recent Labs  Lab 10/24/23 1146 10/24/23 1654 10/24/23 2124 10/25/23 0758 10/25/23 1133  GLUCAP 282* 182* 223* 204* 261*    Discharge time spent: 39 minutes.  Signed: Marcelino Duster, MD [facility] 10/25/2023

## 2023-10-27 ENCOUNTER — Encounter: Payer: Self-pay | Admitting: Emergency Medicine

## 2023-10-27 ENCOUNTER — Emergency Department: Payer: MEDICAID

## 2023-10-27 ENCOUNTER — Observation Stay
Admission: EM | Admit: 2023-10-27 | Discharge: 2023-10-28 | Disposition: A | Discharge status: Home / Self Care | Payer: MEDICAID | Attending: Osteopathic Medicine | Admitting: Osteopathic Medicine

## 2023-10-27 ENCOUNTER — Other Ambulatory Visit: Payer: Self-pay

## 2023-10-27 DIAGNOSIS — E871 Hypo-osmolality and hyponatremia: Secondary | ICD-10-CM

## 2023-10-27 DIAGNOSIS — R6510 Systemic inflammatory response syndrome (SIRS) of non-infectious origin without acute organ dysfunction: Principal | ICD-10-CM

## 2023-10-27 DIAGNOSIS — Z794 Long term (current) use of insulin: Secondary | ICD-10-CM | POA: Diagnosis not present

## 2023-10-27 DIAGNOSIS — R400 Somnolence: Secondary | ICD-10-CM

## 2023-10-27 DIAGNOSIS — Z7982 Long term (current) use of aspirin: Secondary | ICD-10-CM | POA: Insufficient documentation

## 2023-10-27 DIAGNOSIS — J45909 Unspecified asthma, uncomplicated: Secondary | ICD-10-CM | POA: Insufficient documentation

## 2023-10-27 DIAGNOSIS — F3164 Bipolar disorder, current episode mixed, severe, with psychotic features: Secondary | ICD-10-CM | POA: Diagnosis present

## 2023-10-27 DIAGNOSIS — E1110 Type 2 diabetes mellitus with ketoacidosis without coma: Secondary | ICD-10-CM | POA: Diagnosis not present

## 2023-10-27 DIAGNOSIS — G9340 Encephalopathy, unspecified: Secondary | ICD-10-CM | POA: Diagnosis not present

## 2023-10-27 DIAGNOSIS — Z7984 Long term (current) use of oral hypoglycemic drugs: Secondary | ICD-10-CM | POA: Diagnosis not present

## 2023-10-27 DIAGNOSIS — R0600 Dyspnea, unspecified: Secondary | ICD-10-CM

## 2023-10-27 DIAGNOSIS — R55 Syncope and collapse: Secondary | ICD-10-CM

## 2023-10-27 DIAGNOSIS — R4182 Altered mental status, unspecified: Secondary | ICD-10-CM | POA: Diagnosis present

## 2023-10-27 DIAGNOSIS — F17210 Nicotine dependence, cigarettes, uncomplicated: Secondary | ICD-10-CM | POA: Diagnosis not present

## 2023-10-27 DIAGNOSIS — E1165 Type 2 diabetes mellitus with hyperglycemia: Secondary | ICD-10-CM

## 2023-10-27 LAB — COMPREHENSIVE METABOLIC PANEL
ALT: 32 U/L (ref 0–44)
AST: 25 U/L (ref 15–41)
Albumin: 3.5 g/dL (ref 3.5–5.0)
Alkaline Phosphatase: 97 U/L (ref 38–126)
Anion gap: 11 (ref 5–15)
BUN: 16 mg/dL (ref 6–20)
CO2: 24 mmol/L (ref 22–32)
Calcium: 9 mg/dL (ref 8.9–10.3)
Chloride: 98 mmol/L (ref 98–111)
Creatinine, Ser: 0.77 mg/dL (ref 0.44–1.00)
GFR, Estimated: >60 mL/min (ref >60)
Glucose, Bld: 374 mg/dL — ABNORMAL HIGH (ref 70–99)
Potassium: 4.1 mmol/L (ref 3.5–5.1)
Sodium: 133 mmol/L — ABNORMAL LOW (ref 135–145)
Total Bilirubin: 0.5 mg/dL (ref <1.2)
Total Protein: 8.4 g/dL — ABNORMAL HIGH (ref 6.5–8.1)

## 2023-10-27 LAB — CBC WITH DIFFERENTIAL/PLATELET
Abs Immature Granulocytes: 0.09 10*3/uL — ABNORMAL HIGH (ref 0.00–0.07)
Basophils Absolute: 0.1 10*3/uL (ref 0.0–0.1)
Basophils Relative: 1 %
Eosinophils Absolute: 0.3 10*3/uL (ref 0.0–0.5)
Eosinophils Relative: 3 %
HCT: 40.5 % (ref 36.0–46.0)
Hemoglobin: 13.6 g/dL (ref 12.0–15.0)
Immature Granulocytes: 1 %
Lymphocytes Relative: 29 %
Lymphs Abs: 3.7 10*3/uL (ref 0.7–4.0)
MCH: 30.1 pg (ref 26.0–34.0)
MCHC: 33.6 g/dL (ref 30.0–36.0)
MCV: 89.6 fL (ref 80.0–100.0)
Monocytes Absolute: 0.9 10*3/uL (ref 0.1–1.0)
Monocytes Relative: 7 %
Neutro Abs: 7.5 10*3/uL (ref 1.7–7.7)
Neutrophils Relative %: 59 %
Platelets: 348 10*3/uL (ref 150–400)
RBC: 4.52 MIL/uL (ref 3.87–5.11)
RDW: 11.6 % (ref 11.5–15.5)
WBC: 12.5 10*3/uL — ABNORMAL HIGH (ref 4.0–10.5)
nRBC: 1 % — ABNORMAL HIGH (ref 0.0–0.2)

## 2023-10-27 LAB — APTT: aPTT: 28 s (ref 24–36)

## 2023-10-27 LAB — TROPONIN I (HIGH SENSITIVITY): Troponin I (High Sensitivity): 2 ng/L (ref <18)

## 2023-10-27 LAB — LACTIC ACID, PLASMA: Lactic Acid, Venous: 2.3 mmol/L (ref 0.5–1.9)

## 2023-10-27 LAB — PROTIME-INR
INR: 1.1 (ref 0.8–1.2)
Prothrombin Time: 14.2 s (ref 11.4–15.2)

## 2023-10-27 LAB — CBG MONITORING, ED: Glucose-Capillary: 379 mg/dL — ABNORMAL HIGH (ref 70–99)

## 2023-10-27 MED ORDER — ACETAMINOPHEN 325 MG RE SUPP: 650.0000 mg | Freq: Four times a day (QID) | RECTAL | Status: DC | PRN

## 2023-10-27 MED ORDER — ACETAMINOPHEN 325 MG PO TABS
650.0000 mg | ORAL_TABLET | Freq: Four times a day (QID) | ORAL | Status: DC | PRN
  Administered 2023-10-28: 650 mg via ORAL
  Filled 2023-10-27: qty 2

## 2023-10-27 MED ORDER — ASPIRIN 325 MG PO TBEC
325.0000 mg | DELAYED_RELEASE_TABLET | Freq: Every day | ORAL | Status: DC
  Filled 2023-10-27: qty 1

## 2023-10-27 MED ORDER — ONDANSETRON HCL 4 MG/2ML IJ SOLN: 4.0000 mg | Freq: Four times a day (QID) | INTRAMUSCULAR | Status: DC | PRN

## 2023-10-27 MED ORDER — TRAZODONE HCL 50 MG PO TABS
25.0000 mg | ORAL_TABLET | Freq: Every evening | ORAL | Status: DC | PRN
  Administered 2023-10-28: 25 mg via ORAL
  Filled 2023-10-27: qty 1

## 2023-10-27 MED ORDER — ASPIRIN 325 MG PO TBEC: 325.0000 mg | DELAYED_RELEASE_TABLET | Freq: Two times a day (BID) | ORAL | Status: DC

## 2023-10-27 MED ORDER — ONDANSETRON HCL 4 MG PO TABS: 4.0000 mg | ORAL_TABLET | Freq: Four times a day (QID) | ORAL | Status: DC | PRN

## 2023-10-27 MED ORDER — IOHEXOL 350 MG/ML SOLN
100.0000 mL | Freq: Once | INTRAVENOUS | Status: AC | PRN
  Administered 2023-10-27: 100 mL via INTRAVENOUS

## 2023-10-27 MED ORDER — MAGNESIUM HYDROXIDE 400 MG/5ML PO SUSP: 30.0000 mL | Freq: Every day | ORAL | Status: DC | PRN

## 2023-10-27 MED ORDER — OXYCODONE HCL 5 MG PO TABS: 5.0000 mg | ORAL_TABLET | ORAL | Status: DC | PRN

## 2023-10-27 MED ORDER — KETOROLAC TROMETHAMINE 15 MG/ML IJ SOLN
15.0000 mg | Freq: Four times a day (QID) | INTRAMUSCULAR | Status: DC | PRN
  Administered 2023-10-28 (×2): 15 mg via INTRAVENOUS
  Filled 2023-10-27 (×3): qty 1

## 2023-10-27 MED ORDER — SODIUM CHLORIDE 0.9 % IV SOLN
INTRAVENOUS | Status: DC
Start: 2023-10-27 — End: 2023-10-28

## 2023-10-27 MED ORDER — INSULIN GLARGINE-YFGN 100 UNIT/ML ~~LOC~~ SOLN
55.0000 [IU] | Freq: Every day | SUBCUTANEOUS | Status: DC
  Administered 2023-10-28: 55 [IU] via SUBCUTANEOUS
  Filled 2023-10-27: qty 0.55

## 2023-10-27 MED ORDER — ENOXAPARIN SODIUM 80 MG/0.8ML IJ SOSY
80.0000 mg | PREFILLED_SYRINGE | INTRAMUSCULAR | Status: DC
  Administered 2023-10-28: 80 mg via SUBCUTANEOUS
  Filled 2023-10-27: qty 0.8

## 2023-10-27 NOTE — ED Triage Notes (Signed)
Patient brought in from home tonight by [facility], initial call was for drug overdose. Patient was found down at home, is alert but not completely oriented it seems. Patient just had surgery on her leg, states she fell and could not get back up. Endorses taking 2 oxy PTA and feeling dizzy. On arrival patient has some slurred speech. She keeps repeating "something does not feel right".

## 2023-10-27 NOTE — ED Provider Notes (Signed)
[facility] Provider Note    Event Date/Time   First MD Initiated Contact with Patient 10/27/23 2140     (approximate)   History   Altered mental status,  HPI  Cassandra Flynn is a 27 y.o. female history of diabetes, recent open reduction and fixation of the left lower tib-fib ankle  Patient was at home, evidently started feeling very confused.  It is unclear of the exact time of onset, but estimated to be within about 30 minutes of the time EMS received a call.  Patient reports she was using her phone started feeling woozy lightheaded this was not too long after suspected or she thinks she took her pain medicine.  She then reports she office and felt dizzy lightheaded short of breath.  No headache.  No numbness or weakness.  She felt weak on her feet, and had a fall.  Not sure if she struck her head did not lose consciousness though.  Reports she feels sore over her left chest  EMS was called.  Her family member who is typically with her at stepped out for about 30 minutes and returned home to find EMS at home  Right now she reports that she feels "sorry" but cannot really describe why she feels sorry.  She also reports she feels sort of drowsy and lightheaded.  She is not complaining of pain     Physical Exam   Triage Vital Signs: ED Triage Vitals  Encounter Vitals Group     BP      Systolic BP Percentile      Diastolic BP Percentile      Pulse      Resp      Temp      Temp src      SpO2      Weight      Height      Head Circumference      Peak Flow      Pain Score      Pain Loc      Pain Education      Exclude from Growth Chart     Most recent vital signs: Vitals:   10/27/23 2157  BP: 130/69  Pulse: 100  Resp: 18  Temp: 98.1 F (36.7 C)  SpO2: 100%     General: Awake, no distress.  States seems to have slight slurring of speech, occasionally repetitive words.  She has no noted focal deficits however.  She demonstrates no  pronator drift in any extremity.  She lifts the right leg off the bed without difficulty the left leg she is able to wiggle the toes and flex slightly at the knee, expected that she would not be able to use this extremity well given its splinting and recent surgery.  However she appears to be neurologically intact in all extremities.  She denies headache no neck pain or tenderness.  Speech again is slightly slurred.  Extraocular movements are normal facial expressions are equal but tongue protrudes at midline and her smile is symmetric CV:  Good peripheral perfusion.  Mild to minimal tachycardia no murmur Resp:  Normal effort.  Clear bilateral.  No tenderness noted palpation of the chest wall no bruising or bleeding of the abdomen chest back or flanks Normocephalic atraumatic Abd:  No distention.  Obese, but soft nontender Other:  Right lower extremity moves well 5/5 strength no deficits.  Left lower extremity limitation in movement due to splint, but wiggles toes well vascularly  intact with normal cap refill toes left foot.  She denies severe pain in the foot or ankle.  Splint in place.  No active bleeding or discharge     ED Results / Procedures / Treatments   Labs (all labs ordered are listed, but only abnormal results are displayed) Labs Reviewed  LACTIC ACID, PLASMA - Abnormal; Notable for the following components:      Result Value   Lactic Acid, Venous 2.3 (*)    All other components within normal limits  COMPREHENSIVE METABOLIC PANEL - Abnormal; Notable for the following components:   Sodium 133 (*)    Glucose, Bld 374 (*)    Total Protein 8.4 (*)    All other components within normal limits  CBC WITH DIFFERENTIAL/PLATELET - Abnormal; Notable for the following components:   WBC 12.5 (*)    nRBC 1.0 (*)    Abs Immature Granulocytes 0.09 (*)    All other components within normal limits  CBG MONITORING, ED - Abnormal; Notable for the following components:   Glucose-Capillary 379  (*)    All other components within normal limits  CULTURE, BLOOD (SINGLE)  PROTIME-INR  APTT  URINE DRUG SCREEN, QUALITATIVE ([facility] ONLY)  URINALYSIS, W/ REFLEX TO CULTURE (INFECTION SUSPECTED)  LACTIC ACID, PLASMA  ETHANOL  POC URINE PREG, ED  TROPONIN I (HIGH SENSITIVITY)     EKG  And interpreted by me at 2157 heart rate 100 QRS 80 QTc 420 Sinus tachycardia, mild nonspecific T wave abnormality no evidence of acute ischemia   RADIOLOGY   CT head and CT PE study interpreted by me as grossly negative for obvious acute finding  CT Head Wo Contrast  Result Date: 10/27/2023 CLINICAL DATA:  Recent fall with facial trauma, initial encounter EXAM: CT HEAD WITHOUT CONTRAST CT CERVICAL SPINE WITHOUT CONTRAST TECHNIQUE: Multidetector CT imaging of the head and cervical spine was performed following the standard protocol without intravenous contrast. Multiplanar CT image reconstructions of the cervical spine were also generated. RADIATION DOSE REDUCTION: This exam was performed according to the departmental dose-optimization program which includes automated exposure control, adjustment of the mA and/or kV according to patient size and/or use of iterative reconstruction technique. COMPARISON:  None Available. FINDINGS: CT HEAD FINDINGS Brain: No evidence of acute infarction, hemorrhage, hydrocephalus, extra-axial collection or mass lesion/mass effect. Vascular: No hyperdense vessel or unexpected calcification. Skull: Normal. Negative for fracture or focal lesion. Sinuses/Orbits: No acute finding. Other: None. CT CERVICAL SPINE FINDINGS Alignment: Loss of the normal cervical lordosis is noted. Skull base and vertebrae: 7 cervical segments are well visualized. Vertebral body height is well maintained. No acute fracture or acute facet abnormality is noted. The odontoid is within normal limits. Soft tissues and spinal canal: Surrounding soft tissue structures are within normal limits. Upper chest:  Visualized lung apices are unremarkable. Other: None IMPRESSION: CT of the head: No acute intracranial abnormality noted. CT of cervical spine: No acute bony abnormality is noted. Electronically Signed   By: Alcide Clever M.D.   On: 10/27/2023 22:42   CT Cervical Spine Wo Contrast  Result Date: 10/27/2023 CLINICAL DATA:  Recent fall with facial trauma, initial encounter EXAM: CT HEAD WITHOUT CONTRAST CT CERVICAL SPINE WITHOUT CONTRAST TECHNIQUE: Multidetector CT imaging of the head and cervical spine was performed following the standard protocol without intravenous contrast. Multiplanar CT image reconstructions of the cervical spine were also generated. RADIATION DOSE REDUCTION: This exam was performed according to the departmental dose-optimization program which includes automated exposure control, adjustment  of the mA and/or kV according to patient size and/or use of iterative reconstruction technique. COMPARISON:  None Available. FINDINGS: CT HEAD FINDINGS Brain: No evidence of acute infarction, hemorrhage, hydrocephalus, extra-axial collection or mass lesion/mass effect. Vascular: No hyperdense vessel or unexpected calcification. Skull: Normal. Negative for fracture or focal lesion. Sinuses/Orbits: No acute finding. Other: None. CT CERVICAL SPINE FINDINGS Alignment: Loss of the normal cervical lordosis is noted. Skull base and vertebrae: 7 cervical segments are well visualized. Vertebral body height is well maintained. No acute fracture or acute facet abnormality is noted. The odontoid is within normal limits. Soft tissues and spinal canal: Surrounding soft tissue structures are within normal limits. Upper chest: Visualized lung apices are unremarkable. Other: None IMPRESSION: CT of the head: No acute intracranial abnormality noted. CT of cervical spine: No acute bony abnormality is noted. Electronically Signed   By: Alcide Clever M.D.   On: 10/27/2023 22:42   CT Angio Chest PE W and/or Wo  Contrast  Result Date: 10/27/2023 CLINICAL DATA:  Found down, drug overdose, fell, recent leg surgery EXAM: CT ANGIOGRAPHY CHEST WITH CONTRAST TECHNIQUE: Multidetector CT imaging of the chest was performed using the standard protocol during bolus administration of intravenous contrast. Multiplanar CT image reconstructions and MIPs were obtained to evaluate the vascular anatomy. RADIATION DOSE REDUCTION: This exam was performed according to the departmental dose-optimization program which includes automated exposure control, adjustment of the mA and/or kV according to patient size and/or use of iterative reconstruction technique. CONTRAST:  OMNIPAQUE IOHEXOL 350 MG/ML SOLN COMPARISON:  04/02/2021, 10/27/2023 FINDINGS: Cardiovascular: This is a technically adequate evaluation of the pulmonary vasculature. No filling defects or pulmonary emboli. The heart is unremarkable without pericardial effusion. No evidence of thoracic aortic aneurysm or dissection. Mediastinum/Nodes: No enlarged mediastinal, hilar, or axillary lymph nodes. Thyroid gland, trachea, and esophagus demonstrate no significant findings. Lungs/Pleura: No acute airspace disease, effusion, or pneumothorax. Central airways are patent. Upper Abdomen: No acute abnormality. Musculoskeletal: No acute or destructive bony abnormalities. Reconstructed images demonstrate no additional findings. Review of the MIP images confirms the above findings. IMPRESSION: 1. No evidence of pulmonary embolus. 2. No acute intrathoracic process. Electronically Signed   By: Sharlet Salina M.D.   On: 10/27/2023 22:41   DG Chest Port 1 View  Result Date: 10/27/2023 CLINICAL DATA:  Possible sepsis/possible drug overdose EXAM: PORTABLE CHEST 1 VIEW COMPARISON:  None Available. FINDINGS: The heart size and mediastinal contours are within normal limits. Both lungs are clear. The visualized skeletal structures are unremarkable. IMPRESSION: No active disease. Electronically  Signed   By: Alcide Clever M.D.   On: 10/27/2023 22:24      PROCEDURES:  Critical Care performed: Yes, see critical care procedure note(s)  Procedures   MEDICATIONS ORDERED IN ED: Medications  iohexol (OMNIPAQUE) 350 MG/ML injection 100 mL (100 mLs Intravenous Contrast Given 10/27/23 2224)     IMPRESSION / MDM / ASSESSMENT AND PLAN / ED COURSE  I reviewed the triage vital signs and the nursing notes.                              Differential diagnosis includes, but is not limited to, possible encephalopathy, questionable if possible medication effect, also high on the differential would be concerns such as pulmonary embolism given dizziness and associated feeling of shortness of breath that seems to have improved but occurred at the time of onset, her pupils are equal round and reactive  she has slight slurring of speech, and I suspect unlikely to represent an acute ischemic stroke or intracranial hemorrhage but this would remain on her differential, but given the nonfocal nature of her symptoms and examination I do not find clear concern for ischemia.  Will obtain CT of the head, CT PE study.  Await labs evaluate for infection metabolic cause etc. the differential diagnosis is quite broad.  No seizure-like activity  Patient's presentation is most consistent with acute presentation with potential threat to life or bodily function.   The patient is on the cardiac monitor to evaluate for evidence of arrhythmia and/or significant heart rate changes.  ----------------------------------------- 11:20 PM on 10/27/2023 ----------------------------------------- Patient is currently resting she is in no acute distress.  Her speech clearing, no longer stuttering, her mental status is more alert and she is resting with eyes open and conversant.  She reports a mild sense of a frontal headache at this time, but feels this is much better.  The severe dizziness she had is better.  Consulted with and  patient will be admitted to the service of Dr. Arville Care for further evaluation.  Urinalysis is pending as well .  She has evidence of a mildly elevated lactic acid, uncontrolled hyperglycemia without evidence of acute elevation of anion gap or acidosis, mild leukocytosis.  Further workup pending, need for observation.  Consideration for other treatment such as MRIs given, but at this juncture symptoms are improving and she has no focal findings would be highly suggestive of acute ischemia.   FINAL CLINICAL IMPRESSION(S) / ED DIAGNOSES   Final diagnoses:  SIRS (systemic inflammatory response syndrome) (HCC)  Dyspnea, unspecified type  Somnolence     Rx / DC Orders   ED Discharge Orders     None        Note:  This document was prepared using Dragon voice recognition software and may include unintentional dictation errors.   Sharyn Creamer, MD 10/27/23 [phone]

## 2023-10-27 NOTE — Assessment & Plan Note (Addendum)
-   We will place the patient on supplemental coverage with NovoLog. - We will continue basal coverage. - We will hold off metformin.

## 2023-10-27 NOTE — Assessment & Plan Note (Signed)
-   Will hydrate with IV normal saline and follow BMP.

## 2023-10-27 NOTE — Progress Notes (Signed)
PHARMACIST - PHYSICIAN COMMUNICATION  CONCERNING:  Enoxaparin (Lovenox) for DVT Prophylaxis    RECOMMENDATION: Patient was prescribed enoxaprin 40mg  q24 hours for VTE prophylaxis.   Filed Weights   10/27/23 2157  Weight: (!) 159.8 kg (352 lb 4.7 oz)    Body mass index is 49.14 kg/m.  Estimated Creatinine Clearance: 180.6 mL/min (by C-G formula based on SCr of 0.77 mg/dL).   Based on [facility] policy patient is candidate for enoxaparin 0.5mg /kg TBW SQ every 24 hours based on BMI being >30.  DESCRIPTION: Pharmacy has adjusted enoxaparin dose per [facility] policy.  Patient is now receiving enoxaparin 0.5 mg/kg every 24 hours   Otelia Sergeant, PharmD, [facility] 10/27/2023 11:38 PM

## 2023-10-27 NOTE — ED Notes (Signed)
Fanny Bien, MD notified of critical lactic of 2.3

## 2023-10-27 NOTE — Assessment & Plan Note (Signed)
-   This is of unclear etiology.  It could be related to polypharmacy with opiates being the mainstay of pain management. - She does not seem to have clear infectious etiology or symptoms of infection. - She will be admitted to a medical observation bed. - We will follow neurochecks every 4 hours for 24 hours. - Will continue hydration with IV normal saline.

## 2023-10-27 NOTE — H&P (Incomplete)
[place]   PATIENT NAME: Cassandra Flynn    MR#:  [id number]  DATE OF BIRTH:  1996-10-06  DATE OF ADMISSION:  10/27/2023  PRIMARY CARE PHYSICIAN: [facility]   Patient is coming from: Home  REQUESTING/REFERRING PHYSICIAN: Sharyn Creamer, MD  CHIEF COMPLAINT:   Chief Complaint  Patient presents with   Altered Mental Status    HISTORY OF PRESENT ILLNESS:  Cassandra Flynn is a 27 y.o. African-American female with medical history significant for asthma, type 2 diabetes mellitus, morbid obesity, hypertension and obstructive sleep apnea, presented to the emergency room with acute onset of altered mental status with confusion and lightheadedness.  She was noted to be confused by EMS with slurred speech as well as stuttering.  The patient had a fall about a week ago with subsequent distal tibia/fibular fracture and due to worsening pain later on was admitted here on 10/21/2023 and underwent ORIF and was discharged 2 days ago.  She stated that she took 2 oxycodone 5 mg tablets and waited an hour before getting up to the bathroom.  She believes that she got dizzy and passed out and fell on the floor.  No head injuries.  She denies any fever or chills at home.  No dysuria oliguria, hematuria or flank pain.  She has been having occasional chest pressure or palpitations.  No cough or wheezing or dyspnea.  No nausea or vomiting or abdominal pain.  No dysuria, oliguria or hematuria or flank pain.  ED Course: When she came to the ER vital signs were within normal.  Labs revealed mild hyponatremia and hyperglycemia of 374 lactic acid of 2.3 and leukocytosis of 12.5 on her CBC.  Coagulation profile was normal.  Blood culture were drawn. EKG as reviewed by me : EKG showed normal sinus tachycardia with a rate of 105 with no acute findings. Imaging: Noncontrasted head CT scan revealed no acute intracranial normalities.  C-spine CT showed no acute bony abnormalities.   CTA of the chest showed no evidence of PE or acute intrathoracic process.  The patient will be admitted to the medical observation bed for further evaluation and management. PAST MEDICAL HISTORY:   Past Medical History:  Diagnosis Date   Asthma    Diabetes mellitus without complication (HCC)    Diabetic ketoacidosis (HCC) 06/22/2019   Hypertension    Sleep apnea     PAST SURGICAL HISTORY:   Past Surgical History:  Procedure Laterality Date   CESAREAN SECTION     CHOLECYSTECTOMY     ORIF FIBULA FRACTURE  10/22/2023   Procedure: OPEN REDUCTION INTERNAL FIXATION (ORIF) FIBULA FRACTURE;  Surgeon: Cecil Cranker, MD;  Location: [facility] ORS;  Service: Orthopedics;;   SKIN GRAFT     TIBIA IM NAIL INSERTION Left 10/22/2023   Procedure: INTRAMEDULLARY (IM) NAIL TIBIAL;  Surgeon: Cecil Cranker, MD;  Location: [facility] ORS;  Service: Orthopedics;  Laterality: Left;   TONSILLECTOMY      SOCIAL HISTORY:   Social History   Tobacco Use   Smoking status: Every Day    Types: Cigarettes   Smokeless tobacco: Never  Substance Use Topics   Alcohol use: No    FAMILY HISTORY:  History reviewed. No pertinent family history.  DRUG ALLERGIES:   Allergies  Allergen Reactions   Penicillins Itching, Swelling and Rash    Has patient had a PCN reaction causing immediate rash, facial/tongue/throat swelling, SOB or lightheadedness with hypotension: Unknown Has patient had  a PCN reaction causing severe rash involving mucus membranes or skin necrosis: Unknown Has patient had a PCN reaction that required hospitalization: Unknown Has patient had a PCN reaction occurring within the last 10 years: Unknown If all of the above answers are "NO", then may proceed with Cephalosporin use.     REVIEW OF SYSTEMS:   ROS As per history of present illness. All pertinent systems were reviewed above. Constitutional, HEENT, cardiovascular, respiratory, GI, GU, musculoskeletal, neuro, psychiatric,  endocrine, integumentary and hematologic systems were reviewed and are otherwise negative/unremarkable except for positive findings mentioned above in the HPI.   MEDICATIONS AT HOME:   Prior to Admission medications   Medication Sig Start Date End Date Taking? Authorizing Provider  albuterol (PROVENTIL HFA;VENTOLIN HFA) 108 (90 Base) MCG/ACT inhaler Inhale 2 puffs into the lungs every 6 (six) hours as needed for wheezing or shortness of breath. Patient not taking: Reported on 04/02/2021 08/11/17   Willy Eddy, MD  aspirin EC 325 MG tablet Take 1 tablet (325 mg total) by mouth 2 (two) times daily with a meal. 10/25/23   Anson Oregon, PA-C  blood glucose meter kit and supplies KIT Dispense based on patient and insurance preference. Use up to four times daily as directed. Patient not taking: Reported on 10/23/2023 04/07/21   Tyrone Nine, MD  insulin aspart (NOVOLOG) 100 UNIT/ML FlexPen Inject 15 Units into the skin 3 (three) times daily with meals. 15 units with meals with additional sliding scale dose based on blood sugar (0-15 units) as below - CBG 70 - 120: 0 units, CBG 121 - 150: 2 units, CBG 151 - 200: 3 units, CBG 201 - 250: 5 units, CBG 251 - 300: 8 units, CBG 301 - 350: 11 units, CBG 351 - 400: 15 units, CBG <70 or > 400 call MD 10/25/23   Marcelino Duster, MD  insulin glargine (LANTUS SOLOSTAR) 100 UNIT/ML Solostar Pen Inject 55 Units into the skin daily. 10/25/23 02/22/24  Marcelino Duster, MD  Insulin Pen Needle 32G X 4 MM MISC Use to inject insulin 4 times daily as directed 10/25/23   Marcelino Duster, MD  metFORMIN (GLUCOPHAGE) 500 MG tablet Take 2 tablets (1,000 mg total) by mouth 2 (two) times daily with a meal. 04/07/21 05/07/21  Tyrone Nine, MD  ondansetron (ZOFRAN-ODT) 4 MG disintegrating tablet Take 1 tablet (4 mg total) by mouth every 6 (six) hours as needed for nausea or vomiting. 10/25/23   Marcelino Duster, MD  oxyCODONE (OXY IR/ROXICODONE) 5 MG  immediate release tablet Take 1-2 tablets (5-10 mg total) by mouth every 4 (four) hours as needed for moderate pain (pain score 4-6) or severe pain (pain score 7-10). 10/25/23   Anson Oregon, PA-C      VITAL SIGNS:  Blood pressure 130/69, pulse 100, temperature 98.1 F (36.7 C), temperature source Oral, resp. rate 18, height 5\' 11"  (1.803 m), weight (!) 159.8 kg, SpO2 100%.  PHYSICAL EXAMINATION:  Physical Exam  GENERAL:  27 y.o.-year-old African-American female patient lying in the bed with no acute distress.  EYES: Pupils equal, round, reactive to light and accommodation. No scleral icterus. Extraocular muscles intact.  HEENT: Head atraumatic, normocephalic. Oropharynx and nasopharynx clear.  NECK:  Supple, no jugular venous distention. No thyroid enlargement, no tenderness.  LUNGS: Normal breath sounds bilaterally, no wheezing, rales,rhonchi or crepitation. No use of accessory muscles of respiration.  CARDIOVASCULAR: Regular rate and rhythm, S1, S2 normal. No murmurs, rubs, or gallops.  ABDOMEN: Soft, nondistended, nontender.  Bowel sounds present. No organomegaly or mass.  EXTREMITIES: No pedal edema, cyanosis, or clubbing.  NEUROLOGIC: Cranial nerves II through XII are intact. Muscle strength 5/5 in all extremities. Sensation intact. Gait not checked.  PSYCHIATRIC: The patient is alert and oriented x 3.  Normal affect and good eye contact. SKIN: No obvious rash, lesion, or ulcer.   LABORATORY PANEL:   CBC Recent Labs  Lab 10/27/23 2210  WBC 12.5*  HGB 13.6  HCT 40.5  PLT 348   ------------------------------------------------------------------------------------------------------------------  Chemistries  Recent Labs  Lab 10/23/23 0927 10/24/23 1316 10/27/23 2210  NA 133*   < > 133*  K 3.4*   < > 4.1  CL 99   < > 98  CO2 24   < > 24  GLUCOSE 311*   < > 374*  BUN 15   < > 16  CREATININE 0.75   < > 0.77  CALCIUM 8.6*   < > 9.0  MG 1.8  --   --   AST  --   --   25  ALT  --   --  32  ALKPHOS  --   --  97  BILITOT  --   --  0.5   < > = values in this interval not displayed.   ------------------------------------------------------------------------------------------------------------------  Cardiac Enzymes No results for input(s): "TROPONINI" in the last 168 hours. ------------------------------------------------------------------------------------------------------------------  RADIOLOGY:  CT Head Wo Contrast  Result Date: 10/27/2023 CLINICAL DATA:  Recent fall with facial trauma, initial encounter EXAM: CT HEAD WITHOUT CONTRAST CT CERVICAL SPINE WITHOUT CONTRAST TECHNIQUE: Multidetector CT imaging of the head and cervical spine was performed following the standard protocol without intravenous contrast. Multiplanar CT image reconstructions of the cervical spine were also generated. RADIATION DOSE REDUCTION: This exam was performed according to the departmental dose-optimization program which includes automated exposure control, adjustment of the mA and/or kV according to patient size and/or use of iterative reconstruction technique. COMPARISON:  None Available. FINDINGS: CT HEAD FINDINGS Brain: No evidence of acute infarction, hemorrhage, hydrocephalus, extra-axial collection or mass lesion/mass effect. Vascular: No hyperdense vessel or unexpected calcification. Skull: Normal. Negative for fracture or focal lesion. Sinuses/Orbits: No acute finding. Other: None. CT CERVICAL SPINE FINDINGS Alignment: Loss of the normal cervical lordosis is noted. Skull base and vertebrae: 7 cervical segments are well visualized. Vertebral body height is well maintained. No acute fracture or acute facet abnormality is noted. The odontoid is within normal limits. Soft tissues and spinal canal: Surrounding soft tissue structures are within normal limits. Upper chest: Visualized lung apices are unremarkable. Other: None IMPRESSION: CT of the head: No acute intracranial abnormality  noted. CT of cervical spine: No acute bony abnormality is noted. Electronically Signed   By: Alcide Clever M.D.   On: 10/27/2023 22:42   CT Cervical Spine Wo Contrast  Result Date: 10/27/2023 CLINICAL DATA:  Recent fall with facial trauma, initial encounter EXAM: CT HEAD WITHOUT CONTRAST CT CERVICAL SPINE WITHOUT CONTRAST TECHNIQUE: Multidetector CT imaging of the head and cervical spine was performed following the standard protocol without intravenous contrast. Multiplanar CT image reconstructions of the cervical spine were also generated. RADIATION DOSE REDUCTION: This exam was performed according to the departmental dose-optimization program which includes automated exposure control, adjustment of the mA and/or kV according to patient size and/or use of iterative reconstruction technique. COMPARISON:  None Available. FINDINGS: CT HEAD FINDINGS Brain: No evidence of acute infarction, hemorrhage, hydrocephalus, extra-axial collection or mass lesion/mass effect. Vascular: No hyperdense vessel or unexpected  calcification. Skull: Normal. Negative for fracture or focal lesion. Sinuses/Orbits: No acute finding. Other: None. CT CERVICAL SPINE FINDINGS Alignment: Loss of the normal cervical lordosis is noted. Skull base and vertebrae: 7 cervical segments are well visualized. Vertebral body height is well maintained. No acute fracture or acute facet abnormality is noted. The odontoid is within normal limits. Soft tissues and spinal canal: Surrounding soft tissue structures are within normal limits. Upper chest: Visualized lung apices are unremarkable. Other: None IMPRESSION: CT of the head: No acute intracranial abnormality noted. CT of cervical spine: No acute bony abnormality is noted. Electronically Signed   By: Alcide Clever M.D.   On: 10/27/2023 22:42   CT Angio Chest PE W and/or Wo Contrast  Result Date: 10/27/2023 CLINICAL DATA:  Found down, drug overdose, fell, recent leg surgery EXAM: CT ANGIOGRAPHY CHEST  WITH CONTRAST TECHNIQUE: Multidetector CT imaging of the chest was performed using the standard protocol during bolus administration of intravenous contrast. Multiplanar CT image reconstructions and MIPs were obtained to evaluate the vascular anatomy. RADIATION DOSE REDUCTION: This exam was performed according to the departmental dose-optimization program which includes automated exposure control, adjustment of the mA and/or kV according to patient size and/or use of iterative reconstruction technique. CONTRAST:  OMNIPAQUE IOHEXOL 350 MG/ML SOLN COMPARISON:  04/02/2021, 10/27/2023 FINDINGS: Cardiovascular: This is a technically adequate evaluation of the pulmonary vasculature. No filling defects or pulmonary emboli. The heart is unremarkable without pericardial effusion. No evidence of thoracic aortic aneurysm or dissection. Mediastinum/Nodes: No enlarged mediastinal, hilar, or axillary lymph nodes. Thyroid gland, trachea, and esophagus demonstrate no significant findings. Lungs/Pleura: No acute airspace disease, effusion, or pneumothorax. Central airways are patent. Upper Abdomen: No acute abnormality. Musculoskeletal: No acute or destructive bony abnormalities. Reconstructed images demonstrate no additional findings. Review of the MIP images confirms the above findings. IMPRESSION: 1. No evidence of pulmonary embolus. 2. No acute intrathoracic process. Electronically Signed   By: Sharlet Salina M.D.   On: 10/27/2023 22:41   DG Chest Port 1 View  Result Date: 10/27/2023 CLINICAL DATA:  Possible sepsis/possible drug overdose EXAM: PORTABLE CHEST 1 VIEW COMPARISON:  None Available. FINDINGS: The heart size and mediastinal contours are within normal limits. Both lungs are clear. The visualized skeletal structures are unremarkable. IMPRESSION: No active disease. Electronically Signed   By: Alcide Clever M.D.   On: 10/27/2023 22:24      IMPRESSION AND PLAN:  Assessment and Plan: * Acute encephalopathy -  This is of unclear etiology.  It could be related to polypharmacy with opiates being the mainstay of pain management.  This has been likely associated with syncope per her report. - She does not seem to have clear infectious etiology or symptoms of infection. - She will be admitted to a medical observation bed. - We will follow neurochecks every 4 hours for 24 hours. - Will continue hydration with IV normal saline. - Will check orthostatics. - We will monitor for arrhythmias. - Will monitor blood glucose measures.  Uncontrolled type 2 diabetes mellitus with hyperglycemia, with long-term current use of insulin (HCC) - We will place the patient on supplemental coverage with NovoLog. - We will continue basal coverage. - We will hold off metformin.  Severe bipolar I disorder, most recent episode mixed, with psychotic features (HCC) - We will continue her mood stabilizers while holding off for excessive sedation.  Hyponatremia - Will hydrate with IV normal saline and follow BMP.     DVT prophylaxis: Lovenox.  [facility]  Planning:  Code Status: full code.  Family Communication:  The plan of care was discussed in details with the patient (and family). I answered all questions. The patient agreed to proceed with the above mentioned plan. Further management will depend upon hospital course. Disposition Plan: Back to previous home environment Consults called: none.  All the records are reviewed and case discussed with ED provider.  Status is: Observation  I certify that at the time of admission, it is my clinical judgment that the patient will require  hospital care extending less than 2 midnights.                            Dispo: The patient is from: Home              Anticipated d/c is to: Home              Patient currently is not medically stable to d/c.              Difficult to place patient: No  Hannah Beat M.D on 10/28/2023 at 12:20 AM  [facility]   From 7 PM-7  AM, contact night-coverage [email or web address]  CC: Primary care physician; [facility]

## 2023-10-27 NOTE — Assessment & Plan Note (Signed)
-   We will place the patient on supplemental coverage with NovoLog. - We will continue basal coverage. - We will hold off metformin.

## 2023-10-27 NOTE — Assessment & Plan Note (Signed)
-   We will continue her mood stabilizers while holding off for excessive sedation.

## 2023-10-28 ENCOUNTER — Observation Stay: Payer: MEDICAID

## 2023-10-28 ENCOUNTER — Encounter: Payer: Self-pay | Admitting: Radiology

## 2023-10-28 DIAGNOSIS — R55 Syncope and collapse: Secondary | ICD-10-CM

## 2023-10-28 DIAGNOSIS — G9340 Encephalopathy, unspecified: Secondary | ICD-10-CM | POA: Diagnosis not present

## 2023-10-28 LAB — URINALYSIS, W/ REFLEX TO CULTURE (INFECTION SUSPECTED)
Bacteria, UA: NONE SEEN
Bilirubin Urine: NEGATIVE
Glucose, UA: >=500 mg/dL — AB
Hgb urine dipstick: NEGATIVE
Ketones, ur: 5 mg/dL — AB
Leukocytes,Ua: NEGATIVE
Nitrite: NEGATIVE
Protein, ur: NEGATIVE mg/dL
Specific Gravity, Urine: >1.046 — ABNORMAL HIGH (ref 1.005–1.030)
pH: 6 (ref 5.0–8.0)

## 2023-10-28 LAB — CBC
HCT: 36.9 % (ref 36.0–46.0)
Hemoglobin: 12.4 g/dL (ref 12.0–15.0)
MCH: 30.2 pg (ref 26.0–34.0)
MCHC: 33.6 g/dL (ref 30.0–36.0)
MCV: 90 fL (ref 80.0–100.0)
Platelets: 253 10*3/uL (ref 150–400)
RBC: 4.1 MIL/uL (ref 3.87–5.11)
RDW: 11.7 % (ref 11.5–15.5)
WBC: 9.9 10*3/uL (ref 4.0–10.5)
nRBC: 0 % (ref 0.0–0.2)

## 2023-10-28 LAB — URINE DRUG SCREEN, QUALITATIVE (ARMC ONLY)
Amphetamines, Ur Screen: NOT DETECTED
Barbiturates, Ur Screen: NOT DETECTED
Benzodiazepine, Ur Scrn: NOT DETECTED
Cannabinoid 50 Ng, Ur ~~LOC~~: POSITIVE — AB
Cocaine Metabolite,Ur ~~LOC~~: NOT DETECTED
MDMA (Ecstasy)Ur Screen: NOT DETECTED
Methadone Scn, Ur: NOT DETECTED
Opiate, Ur Screen: NOT DETECTED
Phencyclidine (PCP) Ur S: NOT DETECTED
Tricyclic, Ur Screen: NOT DETECTED

## 2023-10-28 LAB — BASIC METABOLIC PANEL
Anion gap: 11 (ref 5–15)
BUN: 17 mg/dL (ref 6–20)
CO2: 24 mmol/L (ref 22–32)
Calcium: 8.5 mg/dL — ABNORMAL LOW (ref 8.9–10.3)
Chloride: 100 mmol/L (ref 98–111)
Creatinine, Ser: 0.64 mg/dL (ref 0.44–1.00)
GFR, Estimated: >60 mL/min (ref >60)
Glucose, Bld: 275 mg/dL — ABNORMAL HIGH (ref 70–99)
Potassium: 3.7 mmol/L (ref 3.5–5.1)
Sodium: 135 mmol/L (ref 135–145)

## 2023-10-28 LAB — CBG MONITORING, ED
Glucose-Capillary: 329 mg/dL — ABNORMAL HIGH (ref 70–99)
Glucose-Capillary: 253 mg/dL — ABNORMAL HIGH (ref 70–99)

## 2023-10-28 LAB — ETHANOL: Alcohol, Ethyl (B): <10 mg/dL (ref <10)

## 2023-10-28 LAB — D-DIMER, QUANTITATIVE: D-Dimer, Quant: 1.59 ug{FEU}/mL — ABNORMAL HIGH (ref 0.00–0.50)

## 2023-10-28 LAB — LACTIC ACID, PLASMA: Lactic Acid, Venous: 1 mmol/L (ref 0.5–1.9)

## 2023-10-28 MED ORDER — LORAZEPAM 2 MG/ML IJ SOLN: 0.5000 mg | Freq: Once | INTRAMUSCULAR | Status: DC

## 2023-10-28 MED ORDER — INSULIN ASPART 100 UNIT/ML IJ SOLN
0.0000 [IU] | Freq: Every day | INTRAMUSCULAR | Status: DC
  Administered 2023-10-28: 4 [IU] via SUBCUTANEOUS
  Filled 2023-10-28: qty 1

## 2023-10-28 MED ORDER — ACETAMINOPHEN 325 MG PO TABS: 650.0000 mg | ORAL_TABLET | Freq: Four times a day (QID) | ORAL | 0 refills | Status: DC | PRN

## 2023-10-28 MED ORDER — INSULIN ASPART 100 UNIT/ML IJ SOLN
0.0000 [IU] | Freq: Three times a day (TID) | INTRAMUSCULAR | Status: DC
  Administered 2023-10-28: 11 [IU] via SUBCUTANEOUS
  Filled 2023-10-28: qty 1

## 2023-10-28 NOTE — ED Notes (Addendum)
This RN at bedside with patient for approximately 1 hour; during this time, patient had multiple near syncopal episodes. Each episode is the same; patient complains of the room spinning and pounding headache, eyes appear heavy and speech becomes delayed. Vitals and EKG remain WDL with each episode.  States this is how she felt at home tonight prior to syncopal episode.   Unable to obtain orthostatics due to this reason.  Hospitalist, Mansy made aware of patient presentation; see new orders.

## 2023-10-28 NOTE — ED Notes (Signed)
D/c papers have been given to the pt.

## 2023-10-28 NOTE — Hospital Course (Signed)
HPI: Cassandra Flynn is a 27 y.o. African-American female with medical history significant for asthma, type 2 diabetes mellitus, morbid obesity, hypertension and obstructive sleep apnea, presented to the emergency room 10/27/23 with acute onset of altered mental status with confusion and lightheadedness. EMS noted slurred speech/stuttering. Of note, recent admission -  fall about a week prior with subsequent distal tibia/fibular fracture and due to worsening pain later on was admitted on 10/21/23, underwent ORIF and was discharged 10/25/23.  She stated that on date of presentation, 10/27/23, she took 2 oxycodone 5 mg tablets and waited an hour before getting up to the bathroom.  She believes that she got dizzy and passed out and fell on the floor.   Hospital course / significant events:  ***  Consultants:  ***  Procedures/Surgeries: ***      ASSESSMENT & PLAN:   Acute encephalopathy Unclear etiology.  It could be related to polypharmacy particularly with opiates which likely associated with syncope per her report. No clear infectious etiology neurochecks every 4 hours for 24 hours. hydration with IV normal saline. check orthostatics. monitor for arrhythmias. monitor blood glucose measures.   Uncontrolled type 2 diabetes mellitus with hyperglycemia, with long-term current use of insulin (HCC) supplemental coverage with NovoLog. continue basal coverage. hold off metformin.   Severe bipolar I disorder, most recent episode mixed, with psychotic features (HCC) Not on medications    Hyponatremia hydrate with IV normal saline and follow BMP.      Morbid obesity based on BMI: Body mass index is 49.14 kg/m.  Underweight - under 18.5  normal weight - 18.5 to 24.9 overweight - 25 to 29.9 obese - 30 or more   DVT prophylaxis: *** IV fluids: *** continuous IV fluids  Nutrition: *** Central lines / invasive devices: ***  Code Status: *** ACP documentation reviewed: ***  none on file in [facility]  TOC needs: *** Barriers to dispo / significant pending items: ***

## 2023-10-28 NOTE — Discharge Summary (Signed)
Physician Discharge Summary   Patient: Cassandra Flynn MRN: [id number]  DOB: October 29, 1996   Admit:     Date of Admission: 10/27/2023 Admitted from: home   Discharge: Date of discharge: 10/28/23 Disposition: Home Condition at discharge: good  CODE STATUS: FULL CODE     Discharge Physician: Sunnie Nielsen, DO [facility]     PCP: Center, [facility]  Recommendations for Outpatient Follow-up:  Follow up with PCP Center, [facility] in 1-2 weeks Please obtain labs/tests: CBC, BMP in 1-2 weeks Please follow up on the following pending results: none    Discharge Instructions     Diet - low sodium heart healthy   Complete by: As directed    Diet Carb Modified   Complete by: As directed    Increase activity slowly   Complete by: As directed          Discharge Diagnoses:  Principal Problem:   Acute encephalopathy Active Problems:   Uncontrolled type 2 diabetes mellitus with hyperglycemia, with long-term current use of insulin (HCC)   Severe bipolar I disorder, most recent episode mixed, with psychotic features (HCC)   Hyponatremia   Syncope       HPI: Cassandra Flynn is a 27 y.o. African-American female with medical history significant for asthma, type 2 diabetes mellitus, morbid obesity, hypertension and obstructive sleep apnea, presented to the emergency room 10/27/23 with acute onset of altered mental status with confusion and lightheadedness. EMS noted slurred speech/stuttering. Of note, recent admission -  fall about a week prior with subsequent distal tibia/fibular fracture and due to worsening pain later on was admitted on 10/21/23, underwent ORIF and was discharged 10/25/23.  She stated that on date of presentation, 10/27/23, she took 2 oxycodone 5 mg tablets and waited an hour before getting up to the bathroom.  She believes that she got dizzy and passed out and fell on the floor.   Hospital course /  significant events:  Workup with BMP, CBC, UA, UDS --> hyperglycemia and associated pseudohyponatremia, Mild elevation lactic acid resolved w/ IV fluids, mild elevation WBC resolved on repeat check, (+)DDImer and r/o PE w/ CTA chest, no UTI, (+)cannabis. CT S-spine, CT head and MRI brain no concerns. EKG no concerns. Suspect concussion as cause of headache and pt was not encephalopathic this morning. Tolerating diet, ambulating independently w/ assistive device given leg fracture, stable for d/c home to self-care   Consultants:  none  Procedures/Surgeries: none      ASSESSMENT & PLAN:   Acute encephalopathy Concussion Unclear etiology.  Seems likely to be related to polypharmacy particularly with opiates and cannabis use  Concussion self care Follow outpatient   Uncontrolled type 2 diabetes mellitus with hyperglycemia, with long-term current use of insulin (HCC) supplemental coverage with NovoLog. continue basal coverage. hold off metformin.   Severe bipolar I disorder, most recent episode mixed, with psychotic features [facility]) Not on medications  Follow outpatient           Discharge Instructions  Allergies as of 10/28/2023       Reactions   Penicillins Itching, Swelling, Rash   Has patient had a PCN reaction causing immediate rash, facial/tongue/throat swelling, SOB or lightheadedness with hypotension: Unknown Has patient had a PCN reaction causing severe rash involving mucus membranes or skin necrosis: Unknown Has patient had a PCN reaction that required hospitalization: Unknown Has patient had a PCN reaction occurring within the last 10 years: Unknown If all of  the above answers are "NO", then may proceed with Cephalosporin use.        Medication List     TAKE these medications    acetaminophen 325 MG tablet Commonly known as: TYLENOL Take 2 tablets (650 mg total) by mouth every 6 (six) hours as needed for mild pain (pain score 1-3), moderate pain (pain  score 4-6) or headache (or Fever >/= 101 - TAKE WITH OXYCODONE).   albuterol 108 (90 Base) MCG/ACT inhaler Commonly known as: VENTOLIN HFA Inhale 2 puffs into the lungs every 6 (six) hours as needed for wheezing or shortness of breath.   aspirin EC 325 MG tablet Take 1 tablet (325 mg total) by mouth 2 (two) times daily with a meal.   blood glucose meter kit and supplies Kit Dispense based on patient and insurance preference. Use up to four times daily as directed.   insulin aspart 100 UNIT/ML FlexPen Commonly known as: NOVOLOG Inject 15 Units into the skin 3 (three) times daily with meals. 15 units with meals with additional sliding scale dose based on blood sugar (0-15 units) as below - CBG 70 - 120: 0 units, CBG 121 - 150: 2 units, CBG 151 - 200: 3 units, CBG 201 - 250: 5 units, CBG 251 - 300: 8 units, CBG 301 - 350: 11 units, CBG 351 - 400: 15 units, CBG <70 or > 400 call MD   Insulin Pen Needle 32G X 4 MM Misc Use to inject insulin 4 times daily as directed   Lantus SoloStar 100 UNIT/ML Solostar Pen Generic drug: insulin glargine Inject 55 Units into the skin daily.   metFORMIN 500 MG tablet Commonly known as: GLUCOPHAGE Take 2 tablets (1,000 mg total) by mouth 2 (two) times daily with a meal.   ondansetron 4 MG disintegrating tablet Commonly known as: ZOFRAN-ODT Take 1 tablet (4 mg total) by mouth every 6 (six) hours as needed for nausea or vomiting.   oxyCODONE 5 MG immediate release tablet Commonly known as: Oxy IR/ROXICODONE Take 1-2 tablets (5-10 mg total) by mouth every 4 (four) hours as needed for moderate pain (pain score 4-6) or severe pain (pain score 7-10).         Follow-up Information     Center, [facility]. Schedule an appointment as soon as possible for a visit.   Specialty: General Practice Why: HOSPITAL FOLLOW UP ASAP Contact information: [address] [place] [address] [phone]                  Allergies  Allergen Reactions   Penicillins Itching, Swelling and Rash    Has patient had a PCN reaction causing immediate rash, facial/tongue/throat swelling, SOB or lightheadedness with hypotension: Unknown Has patient had a PCN reaction causing severe rash involving mucus membranes or skin necrosis: Unknown Has patient had a PCN reaction that required hospitalization: Unknown Has patient had a PCN reaction occurring within the last 10 years: Unknown If all of the above answers are "NO", then may proceed with Cephalosporin use.      Subjective: reports mild headache this morning, tolerating diet, no other concerns this morning, denies SOB, no nausea/vomiting   Discharge Exam: BP (!) 137/101   Pulse 88   Temp 98.1 F (36.7 C) (Oral)   Resp 19   Ht 5\' 11"  (1.803 m)   Wt (!) 159.8 kg   SpO2 100%   BMI 49.14 kg/m  General: Pt is alert, awake, not in acute distress  Cardiovascular: RRR, S1/S2, no rubs, no gallops Respiratory: CTA bilaterally, no wheezing, no rhonchi Abdominal: Soft, NT, ND, bowel sounds + Extremities: no edema, no cyanosis. RLE wrapped in spint which was not removed, toes were warm and sensation intact      The results of significant diagnostics from this hospitalization (including imaging, microbiology, ancillary and laboratory) are listed below for reference.     Microbiology: Recent Results (from the past 240 hour(s))  Surgical PCR screen     Status: None   Collection Time: 10/22/23  3:32 AM   Specimen: Nasal Mucosa; Nasal Swab  Result Value Ref Range Status   MRSA, PCR NEGATIVE NEGATIVE Final   Staphylococcus aureus NEGATIVE NEGATIVE Final    Comment: (NOTE) The Xpert SA Assay (FDA approved for NASAL specimens in patients 37 years of age and older), is one component of a comprehensive surveillance program. It is not intended to diagnose infection nor to guide or monitor treatment. Performed at [facility], [address]  [place], [place] [address]   Blood culture (single)     Status: None (Preliminary result)   Collection Time: 10/27/23 10:08 PM   Specimen: BLOOD  Result Value Ref Range Status   Specimen Description BLOOD BLOOD RIGHT ARM  Final   Special Requests   Final    BOTTLES DRAWN AEROBIC ONLY Blood Culture adequate volume   Culture   Final    NO GROWTH < 12 HOURS Performed at [facility], [address], [place], [place] [address]    Report Status PENDING  Incomplete     Labs: BNP (last 3 results) No results for input(s): "BNP" in the last 8760 hours. Basic Metabolic Panel: Recent Labs  Lab 10/23/23 0927 10/24/23 1316 10/25/23 0822 10/27/23 2210 10/28/23 0714  NA 133* 133* 135 133* 135  K 3.4* 4.1 4.0 4.1 3.7  CL 99 98 99 98 100  CO2 24 24 25 24 24   GLUCOSE 311* 347* 217* 374* 275*  BUN 15 16 13 16 17   CREATININE 0.75 0.63 0.56 0.77 0.64  CALCIUM 8.6* 8.5* 8.9 9.0 8.5*  MG 1.8  --   --   --   --   PHOS 3.2  --   --   --   --    Liver Function Tests: Recent Labs  Lab 10/22/23 1931 10/27/23 2210  AST 115* 25  ALT 54* 32  ALKPHOS 90 97  BILITOT 0.6 0.5  PROT 7.2 8.4*  ALBUMIN 3.4* 3.5   No results for input(s): "LIPASE", "AMYLASE" in the last 168 hours. No results for input(s): "AMMONIA" in the last 168 hours. CBC: Recent Labs  Lab 10/21/23 1405 10/23/23 0927 10/24/23 1316 10/27/23 2210 10/28/23 0714  WBC 12.2* 13.8* 9.0 12.5* 9.9  NEUTROABS  --  8.7* 5.2 7.5  --   HGB 14.2 12.9 12.2 13.6 12.4  HCT 41.1 37.4 36.5 40.5 36.9  MCV 87.3 87.6 90.3 89.6 90.0  PLT 282 241 218 348 253   Cardiac Enzymes: Recent Labs  Lab 10/21/23 1405  CKTOTAL 204   BNP: Invalid input(s): "POCBNP" CBG: Recent Labs  Lab 10/25/23 0758 10/25/23 1133 10/27/23 2151 10/28/23 0117 10/28/23 0709  GLUCAP 204* 261* 379* 329* 253*   D-Dimer Recent Labs    10/27/23 2208  DDIMER 1.59*   Hgb A1c No results for input(s): "HGBA1C" in the last 72 hours. Lipid Profile No  results for input(s): "CHOL", "HDL", "LDLCALC", "TRIG", "CHOLHDL", "LDLDIRECT" in the last 72 hours. Thyroid function  studies No results for input(s): "TSH", "T4TOTAL", "T3FREE", "THYROIDAB" in the last 72 hours.  Invalid input(s): "FREET3" Anemia work up No results for input(s): "VITAMINB12", "FOLATE", "FERRITIN", "TIBC", "IRON", "RETICCTPCT" in the last 72 hours. Urinalysis    Component Value Date/Time   COLORURINE YELLOW (A) 10/27/2023 2210   APPEARANCEUR CLEAR (A) 10/27/2023 2210   APPEARANCEUR Clear 06/15/2014 0625   LABSPEC >1.046 (H) 10/27/2023 2210   LABSPEC 1.011 06/15/2014 0625   PHURINE 6.0 10/27/2023 2210   GLUCOSEU >=500 (A) 10/27/2023 2210   GLUCOSEU Negative 06/15/2014 0625   HGBUR NEGATIVE 10/27/2023 2210   BILIRUBINUR NEGATIVE 10/27/2023 2210   BILIRUBINUR Negative 06/15/2014 0625   KETONESUR 5 (A) 10/27/2023 2210   PROTEINUR NEGATIVE 10/27/2023 2210   NITRITE NEGATIVE 10/27/2023 2210   LEUKOCYTESUR NEGATIVE 10/27/2023 2210   LEUKOCYTESUR Negative 06/15/2014 0625   Sepsis Labs Recent Labs  Lab 10/23/23 0927 10/24/23 1316 10/27/23 2210 10/28/23 0714  WBC 13.8* 9.0 12.5* 9.9   Microbiology Recent Results (from the past 240 hour(s))  Surgical PCR screen     Status: None   Collection Time: 10/22/23  3:32 AM   Specimen: Nasal Mucosa; Nasal Swab  Result Value Ref Range Status   MRSA, PCR NEGATIVE NEGATIVE Final   Staphylococcus aureus NEGATIVE NEGATIVE Final    Comment: (NOTE) The Xpert SA Assay (FDA approved for NASAL specimens in patients 25 years of age and older), is one component of a comprehensive surveillance program. It is not intended to diagnose infection nor to guide or monitor treatment. Performed at [facility], [address] [place], [place] [address]   Blood culture (single)     Status: None (Preliminary result)   Collection Time: 10/27/23 10:08 PM   Specimen: BLOOD  Result Value Ref Range Status   Specimen Description  BLOOD BLOOD RIGHT ARM  Final   Special Requests   Final    BOTTLES DRAWN AEROBIC ONLY Blood Culture adequate volume   Culture   Final    NO GROWTH < 12 HOURS Performed at [facility], [address], [place], [place] [address]    Report Status PENDING  Incomplete   Imaging MR BRAIN WO CONTRAST  Result Date: 10/28/2023 CLINICAL DATA:  Initial evaluation for mental status change, unknown cause. EXAM: MRI HEAD WITHOUT CONTRAST TECHNIQUE: Multiplanar, multiecho pulse sequences of the brain and surrounding structures were obtained without intravenous contrast. COMPARISON:  CT from 10/27/2023. FINDINGS: Brain: Cerebral volume within normal limits for age. No focal parenchymal signal abnormality. No abnormal foci of restricted diffusion to suggest acute or subacute ischemia. Gray-white matter differentiation well maintained. No encephalomalacia to suggest chronic cortical infarction or other insult. No foci of susceptibility artifact indicative of acute or chronic intracranial blood products. No mass lesion, midline shift or mass effect. Ventricles normal in size and morphology without hydrocephalus. No extra-axial fluid collection. Pituitary gland and suprasellar region within normal limits. Vascular: Major intracranial vascular flow voids are well maintained. Skull and upper cervical spine: Craniocervical junction within normal limits. Visualized upper cervical spine demonstrates no significant finding. Bone marrow signal intensity within normal limits. No scalp soft tissue abnormality. Sinuses/Orbits: Globes and orbital soft tissues are within normal limits. Paranasal sinuses are largely clear. No significant mastoid effusion. Other: None. IMPRESSION: Normal brain MRI. No acute intracranial abnormality identified. Electronically Signed   By: Rise Mu M.D.   On: 10/28/2023 02:42   CT Head Wo Contrast  Result Date: 10/27/2023 CLINICAL DATA:  Recent fall with facial trauma,  initial  encounter EXAM: CT HEAD WITHOUT CONTRAST CT CERVICAL SPINE WITHOUT CONTRAST TECHNIQUE: Multidetector CT imaging of the head and cervical spine was performed following the standard protocol without intravenous contrast. Multiplanar CT image reconstructions of the cervical spine were also generated. RADIATION DOSE REDUCTION: This exam was performed according to the departmental dose-optimization program which includes automated exposure control, adjustment of the mA and/or kV according to patient size and/or use of iterative reconstruction technique. COMPARISON:  None Available. FINDINGS: CT HEAD FINDINGS Brain: No evidence of acute infarction, hemorrhage, hydrocephalus, extra-axial collection or mass lesion/mass effect. Vascular: No hyperdense vessel or unexpected calcification. Skull: Normal. Negative for fracture or focal lesion. Sinuses/Orbits: No acute finding. Other: None. CT CERVICAL SPINE FINDINGS Alignment: Loss of the normal cervical lordosis is noted. Skull base and vertebrae: 7 cervical segments are well visualized. Vertebral body height is well maintained. No acute fracture or acute facet abnormality is noted. The odontoid is within normal limits. Soft tissues and spinal canal: Surrounding soft tissue structures are within normal limits. Upper chest: Visualized lung apices are unremarkable. Other: None IMPRESSION: CT of the head: No acute intracranial abnormality noted. CT of cervical spine: No acute bony abnormality is noted. Electronically Signed   By: Alcide Clever M.D.   On: 10/27/2023 22:42   CT Cervical Spine Wo Contrast  Result Date: 10/27/2023 CLINICAL DATA:  Recent fall with facial trauma, initial encounter EXAM: CT HEAD WITHOUT CONTRAST CT CERVICAL SPINE WITHOUT CONTRAST TECHNIQUE: Multidetector CT imaging of the head and cervical spine was performed following the standard protocol without intravenous contrast. Multiplanar CT image reconstructions of the cervical spine were also generated.  RADIATION DOSE REDUCTION: This exam was performed according to the departmental dose-optimization program which includes automated exposure control, adjustment of the mA and/or kV according to patient size and/or use of iterative reconstruction technique. COMPARISON:  None Available. FINDINGS: CT HEAD FINDINGS Brain: No evidence of acute infarction, hemorrhage, hydrocephalus, extra-axial collection or mass lesion/mass effect. Vascular: No hyperdense vessel or unexpected calcification. Skull: Normal. Negative for fracture or focal lesion. Sinuses/Orbits: No acute finding. Other: None. CT CERVICAL SPINE FINDINGS Alignment: Loss of the normal cervical lordosis is noted. Skull base and vertebrae: 7 cervical segments are well visualized. Vertebral body height is well maintained. No acute fracture or acute facet abnormality is noted. The odontoid is within normal limits. Soft tissues and spinal canal: Surrounding soft tissue structures are within normal limits. Upper chest: Visualized lung apices are unremarkable. Other: None IMPRESSION: CT of the head: No acute intracranial abnormality noted. CT of cervical spine: No acute bony abnormality is noted. Electronically Signed   By: Alcide Clever M.D.   On: 10/27/2023 22:42   CT Angio Chest PE W and/or Wo Contrast  Result Date: 10/27/2023 CLINICAL DATA:  Found down, drug overdose, fell, recent leg surgery EXAM: CT ANGIOGRAPHY CHEST WITH CONTRAST TECHNIQUE: Multidetector CT imaging of the chest was performed using the standard protocol during bolus administration of intravenous contrast. Multiplanar CT image reconstructions and MIPs were obtained to evaluate the vascular anatomy. RADIATION DOSE REDUCTION: This exam was performed according to the departmental dose-optimization program which includes automated exposure control, adjustment of the mA and/or kV according to patient size and/or use of iterative reconstruction technique. CONTRAST:  OMNIPAQUE IOHEXOL 350  MG/ML SOLN COMPARISON:  04/02/2021, 10/27/2023 FINDINGS: Cardiovascular: This is a technically adequate evaluation of the pulmonary vasculature. No filling defects or pulmonary emboli. The heart is unremarkable without pericardial effusion. No evidence of thoracic aortic aneurysm  or dissection. Mediastinum/Nodes: No enlarged mediastinal, hilar, or axillary lymph nodes. Thyroid gland, trachea, and esophagus demonstrate no significant findings. Lungs/Pleura: No acute airspace disease, effusion, or pneumothorax. Central airways are patent. Upper Abdomen: No acute abnormality. Musculoskeletal: No acute or destructive bony abnormalities. Reconstructed images demonstrate no additional findings. Review of the MIP images confirms the above findings. IMPRESSION: 1. No evidence of pulmonary embolus. 2. No acute intrathoracic process. Electronically Signed   By: Sharlet Salina M.D.   On: 10/27/2023 22:41   DG Chest Port 1 View  Result Date: 10/27/2023 CLINICAL DATA:  Possible sepsis/possible drug overdose EXAM: PORTABLE CHEST 1 VIEW COMPARISON:  None Available. FINDINGS: The heart size and mediastinal contours are within normal limits. Both lungs are clear. The visualized skeletal structures are unremarkable. IMPRESSION: No active disease. Electronically Signed   By: Alcide Clever M.D.   On: 10/27/2023 22:24      Time coordinating discharge: over 30 minutes  SIGNED:  Sunnie Nielsen DO [facility]

## 2023-10-28 NOTE — ED Notes (Signed)
Lab notified to add on ddimer

## 2023-10-28 NOTE — ED Notes (Signed)
Patient transported to MRI 

## 2023-10-28 NOTE — ED Notes (Signed)
Crackers placed @bedside  while awaiting breakfast after administering AM sliding scale insulin

## 2023-10-28 NOTE — ED Notes (Signed)
Pt given a bus pass for a ride home.

## 2023-10-28 NOTE — ED Notes (Signed)
Pt calling for ride at this time.  °

## 2023-11-01 LAB — CULTURE, BLOOD (SINGLE)
Culture: NO GROWTH
Special Requests: ADEQUATE

## 2023-11-21 ENCOUNTER — Emergency Department: Payer: MEDICAID

## 2023-11-21 ENCOUNTER — Other Ambulatory Visit: Payer: Self-pay

## 2023-11-21 ENCOUNTER — Emergency Department
Admission: EM | Admit: 2023-11-21 | Discharge: 2023-11-21 | Disposition: A | Discharge status: Home / Self Care | Payer: MEDICAID | Attending: Emergency Medicine | Admitting: Emergency Medicine

## 2023-11-21 DIAGNOSIS — J45909 Unspecified asthma, uncomplicated: Secondary | ICD-10-CM | POA: Diagnosis not present

## 2023-11-21 DIAGNOSIS — M9689 Other intraoperative and postprocedural complications and disorders of the musculoskeletal system: Secondary | ICD-10-CM | POA: Diagnosis present

## 2023-11-21 DIAGNOSIS — E119 Type 2 diabetes mellitus without complications: Secondary | ICD-10-CM | POA: Insufficient documentation

## 2023-11-21 DIAGNOSIS — M79605 Pain in left leg: Secondary | ICD-10-CM | POA: Insufficient documentation

## 2023-11-21 MED ORDER — HYDROCODONE-ACETAMINOPHEN 5-325 MG PO TABS
2.0000 | ORAL_TABLET | Freq: Once | ORAL | Status: AC
  Administered 2023-11-21: 2 via ORAL
  Filled 2023-11-21: qty 2

## 2023-11-21 MED ORDER — FENTANYL CITRATE PF 50 MCG/ML IJ SOSY
50.0000 ug | PREFILLED_SYRINGE | Freq: Once | INTRAMUSCULAR | Status: AC
Start: 2023-11-21 — End: 2023-11-21
  Administered 2023-11-21: 50 ug via INTRAMUSCULAR
  Filled 2023-11-21: qty 1

## 2023-11-21 MED ORDER — OXYCODONE-ACETAMINOPHEN 5-325 MG PO TABS
1.0000 | ORAL_TABLET | Freq: Once | ORAL | Status: AC
  Administered 2023-11-21: 1 via ORAL
  Filled 2023-11-21: qty 1

## 2023-11-21 NOTE — ED Notes (Signed)
This RN, Jerilee Field, RN and Darl Pikes PA to bedside to remove pt's cast.

## 2023-11-21 NOTE — ED Triage Notes (Addendum)
Pt to ED [facility] from home woke up with "extreme" pain to left leg. Reports fall a month ago, having surgery, fall a few days ago without injuries. Left leg currently in hard cast.  Received fentanyl IM PTA Reports took oxy last night

## 2023-11-21 NOTE — ED Notes (Signed)
Dr Vicente Males to triage to assess, no new orders. Pt currently on phone talking when staff not around.

## 2023-11-21 NOTE — ED Notes (Signed)
Pt screaming, shaking and crying loudly regarding left leg pain. Dr Vicente Males notified.

## 2023-11-21 NOTE — ED Provider Notes (Signed)
[facility] Provider Note    Event Date/Time   First MD Initiated Contact with Patient 11/21/23 1112     (approximate)   History   Post-op Problem   HPI  Cassandra Flynn is a 27 y.o. female with history of diabetes, asthma presents emergency department with complaints of left leg pain.  Patient had surgery for a fractured tib-fib.  Has rod and plates in place.  Surgery was about 4 weeks ago.  States foot is swollen and she has been having a lot of pain.  He did miss her appointment that she was supposed to go to on Monday but has rescheduled for next Monday.  States no fever or chills.  No new falls      Physical Exam   Triage Vital Signs: ED Triage Vitals  Encounter Vitals Group     BP 11/21/23 1022 105/80     Systolic BP Percentile --      Diastolic BP Percentile --      Pulse Rate 11/21/23 1022 (!) 120     Resp 11/21/23 1022 (!) 22     Temp 11/21/23 1022 97.7 F (36.5 C)     Temp src --      SpO2 11/21/23 1022 100 %     Weight 11/21/23 1021 (!) 352 lb 11.8 oz (160 kg)     Height 11/21/23 1021 5\' 11"  (1.803 m)     Head Circumference --      Peak Flow --      Pain Score 11/21/23 1021 10     Pain Loc --      Pain Education --      Exclude from Growth Chart --     Most recent vital signs: Vitals:   11/21/23 1130 11/21/23 1400  BP: 127/82 136/87  Pulse: 98 98  Resp: 18 18  Temp:    SpO2: 99% 100%     General: Awake, no distress.   CV:  Good peripheral perfusion. regular rate and  rhythm Resp:  Normal effort.  Abd:  No distention.   Other:  Left foot appears to be swollen, pulses intact, after splint was removed sutures are well intact, no redness pus or swelling, tender along the posterior   ED Results / Procedures / Treatments   Labs (all labs ordered are listed, but only abnormal results are displayed) Labs Reviewed - No data to display   EKG     RADIOLOGY X-ray of the left tib-fib, ultrasound left tib-fib for  DVT    PROCEDURES:   Procedures   MEDICATIONS ORDERED IN ED: Medications  HYDROcodone-acetaminophen (NORCO/VICODIN) 5-325 MG per tablet 2 tablet (2 tablets Oral Given 11/21/23 1032)  oxyCODONE-acetaminophen (PERCOCET/ROXICET) 5-325 MG per tablet 1 tablet (1 tablet Oral Given 11/21/23 1124)  fentaNYL (SUBLIMAZE) injection 50 mcg (50 mcg Intramuscular Given 11/21/23 1245)     IMPRESSION / MDM / ASSESSMENT AND PLAN / ED COURSE  I reviewed the triage vital signs and the nursing notes.                              Differential diagnosis includes, but is not limited to, hardware complication, cellulitis, osteoarthritis, DVT  Patient's presentation is most consistent with acute illness / injury with system symptoms.   X-ray of the left tib-fib was independently reviewed and interpreted by me as being negative for any acute abnormality, radiologist comments no complication with hardware  Did have to cut the patient's cast off for ultrasound for DVT.  Suture line appears to be well intact, no redness pus or drainage noted  Ultrasound for DVT independently reviewed interpreted by me as being negative for any acute abnormality.  See med list for pain medications given in the ED.  Patient is requesting more pain medicine prior to discharge and we explained to her that she had maxed out and needed to take Tylenol or ibuprofen and elevate and ice her leg when she returns home as there is no new fracture or DVT.  Consult to Horris Latino, PA-C, I did send [name] a message to see if he wanted [place] to put her in a posterior splint or cam boot, at this time he prefers [place] to put her in a splint and he will replace cast on Monday.  I did explain everything to the patient, she is in agreement treatment plan.  Discharged stable condition.      FINAL CLINICAL IMPRESSION(S) / ED DIAGNOSES   Final diagnoses:  Left leg pain     Rx / DC Orders   ED Discharge Orders     None        Note:   This document was prepared using Dragon voice recognition software and may include unintentional dictation errors.    Faythe Ghee, PA-C 11/21/23 1523    Trinna Post, MD 11/22/23 2207

## 2023-11-21 NOTE — ED Notes (Signed)
Verbal order for pain medication Dr Vicente Males. No imaging at this time

## 2023-12-26 ENCOUNTER — Emergency Department: Payer: MEDICAID

## 2023-12-26 ENCOUNTER — Emergency Department
Admission: EM | Admit: 2023-12-26 | Discharge: 2023-12-26 | Disposition: A | Discharge status: Home / Self Care | Payer: MEDICAID | Attending: Emergency Medicine | Admitting: Emergency Medicine

## 2023-12-26 ENCOUNTER — Other Ambulatory Visit: Payer: Self-pay

## 2023-12-26 DIAGNOSIS — S82832S Other fracture of upper and lower end of left fibula, sequela: Secondary | ICD-10-CM | POA: Diagnosis not present

## 2023-12-26 DIAGNOSIS — S99912S Unspecified injury of left ankle, sequela: Secondary | ICD-10-CM | POA: Diagnosis present

## 2023-12-26 DIAGNOSIS — W1840XS Slipping, tripping and stumbling without falling, unspecified, sequela: Secondary | ICD-10-CM | POA: Insufficient documentation

## 2023-12-26 MED ORDER — OXYCODONE HCL 5 MG PO TABS: 5.0000 mg | ORAL_TABLET | Freq: Three times a day (TID) | ORAL | 0 refills | Status: DC | PRN

## 2023-12-26 MED ORDER — OXYCODONE-ACETAMINOPHEN 5-325 MG PO TABS
1.0000 | ORAL_TABLET | Freq: Once | ORAL | Status: AC
  Administered 2023-12-26: 1 via ORAL
  Filled 2023-12-26: qty 1

## 2023-12-26 NOTE — ED Provider Notes (Signed)
 [facility] Provider Note    Event Date/Time   First MD Initiated Contact with Patient 12/26/23 2307     (approximate)   History   Ankle Pain   HPI Cassandra Flynn is a 28 y.o. female with history of recent distal tibia/fibula fracture presenting today for ankle injury.  Patient states she missed a step and felt a sharp pain in her left ankle.  She denies any worsening pain or tingling in the foot.  Boot is still in place.  No other trauma elsewhere.  Reviewed chart notes from surgery back in December.     Physical Exam   Triage Vital Signs: ED Triage Vitals  Encounter Vitals Group     BP 12/26/23 2039 (!) 154/116     Systolic BP Percentile --      Diastolic BP Percentile --      Pulse Rate 12/26/23 2039 (!) 111     Resp 12/26/23 2039 (!) 22     Temp 12/26/23 2039 98.5 F (36.9 C)     Temp Source 12/26/23 2039 Oral     SpO2 12/26/23 2039 100 %     Weight 12/26/23 2040 (!) 353 lb (160.1 kg)     Height 12/26/23 2040 5\' 11"  (1.803 m)     Head Circumference --      Peak Flow --      Pain Score 12/26/23 2039 10     Pain Loc --      Pain Education --      Exclude from Growth Chart --     Most recent vital signs: Vitals:   12/26/23 2039  BP: (!) 154/116  Pulse: (!) 111  Resp: (!) 22  Temp: 98.5 F (36.9 C)  SpO2: 100%   I have reviewed the vital signs. General:  Awake, alert, no acute distress. Head:  Normocephalic, Atraumatic. EENT:  PERRL, EOMI, Oral mucosa pink and moist, Neck is supple. Cardiovascular: Regular rate, 2+ distal pulses. Respiratory:  Normal respiratory effort, symmetrical expansion, no distress.   Extremities: Boot in place to left lower extremity.  Unwrapped with no obvious deformity.  Sensation intact throughout foot.  No other tenderness palpation elsewhere throughout the foot. Neuro:  Alert and oriented.  Interacting appropriately.   Skin:  Warm, dry, no rash.   Psych: Appropriate affect.    ED Results /  Procedures / Treatments   Labs (all labs ordered are listed, but only abnormal results are displayed) Labs Reviewed - No data to display   EKG    RADIOLOGY Independently interpreted x-ray of left ankle with intact hardware.  Slight change in the fracture pattern but no significant alteration.   PROCEDURES:  Critical Care performed: No  Procedures   MEDICATIONS ORDERED IN ED: Medications  oxyCODONE -acetaminophen  (PERCOCET/ROXICET) 5-325 MG per tablet 1 tablet (1 tablet Oral Given 12/26/23 2350)     IMPRESSION / MDM / ASSESSMENT AND PLAN / ED COURSE  I reviewed the triage vital signs and the nursing notes.                              Differential diagnosis includes, but is not limited to, fibula fracture, tibia fracture, broken hardware  Patient's presentation is most consistent with acute complicated illness / injury requiring diagnostic workup.  Patient is a 28 year old female with a recent surgery on distal left tibia/fibula presenting today for reinjury to that ankle.  Neurovascularly intact.  X-ray  shows intact hardware with maybe slight change in the fracture pattern.  Otherwise hardware is intact and in correct position.  No emergent changes needed at this time.  Will discharge and have her follow-up with her orthopedic team.  Patient was agreeable with this plan.     FINAL CLINICAL IMPRESSION(S) / ED DIAGNOSES   Final diagnoses:  Closed fracture of distal end of left fibula, unspecified fracture morphology, sequela     Rx / DC Orders   ED Discharge Orders          Ordered    oxyCODONE  (ROXICODONE ) 5 MG immediate release tablet  Every 8 hours PRN        12/26/23 2348             Note:  This document was prepared using Dragon voice recognition software and may include unintentional dictation errors.   Kandee Orion, MD 12/26/23 2351

## 2023-12-26 NOTE — ED Triage Notes (Addendum)
 Pt arrives via [facility] with CC of sharp L ankle pain after mechanical misstep. Recent foot surgery with rod placement on 10/22/2023 due to tib/fib fractures - premade boot in place. Sensation, cap refill, and pulses intact at this time.

## 2023-12-26 NOTE — Discharge Instructions (Signed)
 Please call your orthopedic team tomorrow to discuss follow-up.  X-ray shows intact hardware.

## 2023-12-28 ENCOUNTER — Emergency Department
Admission: EM | Admit: 2023-12-28 | Discharge: 2023-12-29 | Disposition: A | Discharge status: Other Acute Inpatient Hospital | Payer: MEDICAID | Attending: Emergency Medicine | Admitting: Emergency Medicine

## 2023-12-28 ENCOUNTER — Other Ambulatory Visit: Payer: Self-pay

## 2023-12-28 DIAGNOSIS — F250 Schizoaffective disorder, bipolar type: Secondary | ICD-10-CM | POA: Diagnosis not present

## 2023-12-28 DIAGNOSIS — F259 Schizoaffective disorder, unspecified: Secondary | ICD-10-CM | POA: Diagnosis present

## 2023-12-28 DIAGNOSIS — R45851 Suicidal ideations: Secondary | ICD-10-CM | POA: Insufficient documentation

## 2023-12-28 DIAGNOSIS — F32A Depression, unspecified: Secondary | ICD-10-CM | POA: Insufficient documentation

## 2023-12-28 DIAGNOSIS — F3130 Bipolar disorder, current episode depressed, mild or moderate severity, unspecified: Secondary | ICD-10-CM

## 2023-12-28 LAB — CBC
HCT: 45.1 % (ref 36.0–46.0)
Hemoglobin: 15.4 g/dL — ABNORMAL HIGH (ref 12.0–15.0)
MCH: 30.1 pg (ref 26.0–34.0)
MCHC: 34.1 g/dL (ref 30.0–36.0)
MCV: 88.1 fL (ref 80.0–100.0)
Platelets: 285 10*3/uL (ref 150–400)
RBC: 5.12 MIL/uL — ABNORMAL HIGH (ref 3.87–5.11)
RDW: 11.9 % (ref 11.5–15.5)
WBC: 8.7 10*3/uL (ref 4.0–10.5)
nRBC: 0 % (ref 0.0–0.2)

## 2023-12-28 LAB — COMPREHENSIVE METABOLIC PANEL
ALT: 21 U/L (ref 0–44)
AST: 23 U/L (ref 15–41)
Albumin: 4 g/dL (ref 3.5–5.0)
Alkaline Phosphatase: 100 U/L (ref 38–126)
Anion gap: 14 (ref 5–15)
BUN: 12 mg/dL (ref 6–20)
CO2: 18 mmol/L — ABNORMAL LOW (ref 22–32)
Calcium: 9.3 mg/dL (ref 8.9–10.3)
Chloride: 104 mmol/L (ref 98–111)
Creatinine, Ser: 0.63 mg/dL (ref 0.44–1.00)
GFR, Estimated: >60 mL/min (ref >60)
Glucose, Bld: 255 mg/dL — ABNORMAL HIGH (ref 70–99)
Potassium: 3.7 mmol/L (ref 3.5–5.1)
Sodium: 136 mmol/L (ref 135–145)
Total Bilirubin: 1 mg/dL (ref 0.0–1.2)
Total Protein: 8 g/dL (ref 6.5–8.1)

## 2023-12-28 LAB — URINE DRUG SCREEN, QUALITATIVE (ARMC ONLY)
Amphetamines, Ur Screen: NOT DETECTED
Barbiturates, Ur Screen: NOT DETECTED
Benzodiazepine, Ur Scrn: NOT DETECTED
Cannabinoid 50 Ng, Ur ~~LOC~~: POSITIVE — AB
Cocaine Metabolite,Ur ~~LOC~~: NOT DETECTED
MDMA (Ecstasy)Ur Screen: NOT DETECTED
Methadone Scn, Ur: NOT DETECTED
Opiate, Ur Screen: NOT DETECTED
Phencyclidine (PCP) Ur S: NOT DETECTED
Tricyclic, Ur Screen: NOT DETECTED

## 2023-12-28 LAB — ACETAMINOPHEN LEVEL: Acetaminophen (Tylenol), Serum: <10 ug/mL — ABNORMAL LOW (ref 10–30)

## 2023-12-28 LAB — SALICYLATE LEVEL: Salicylate Lvl: <7 mg/dL — ABNORMAL LOW (ref 7.0–30.0)

## 2023-12-28 LAB — ETHANOL: Alcohol, Ethyl (B): <10 mg/dL (ref <10)

## 2023-12-28 LAB — POC URINE PREG, ED: Preg Test, Ur: NEGATIVE

## 2023-12-28 MED ORDER — ACETAMINOPHEN 500 MG PO TABS
1000.0000 mg | ORAL_TABLET | Freq: Once | ORAL | Status: AC
  Administered 2023-12-28: 1000 mg via ORAL
  Filled 2023-12-28: qty 2

## 2023-12-28 NOTE — ED Provider Notes (Signed)
[facility] Provider Note    Event Date/Time   First MD Initiated Contact with Patient 12/28/23 1841     (approximate)   History   Suicidal   HPI  Cassandra Flynn is a 28 y.o. female who presents to the emergency department today because of concerns for depression and suicidal ideation.  The patient states that she has been feeling bad for a few months.  She did have an ankle fracture 3 months ago and she states her symptoms really started around then.  Since then she feels like she has been ostracized by her family.  She says she has history of depression.  While she has had thoughts of suicide she has not attempted any self-harm.     Physical Exam   Triage Vital Signs: ED Triage Vitals  Encounter Vitals Group     BP 12/28/23 1653 (!) 180/113     Systolic BP Percentile --      Diastolic BP Percentile --      Pulse Rate 12/28/23 1653 (!) 115     Resp 12/28/23 1653 (!) 22     Temp 12/28/23 1653 98.3 F (36.8 C)     Temp Source 12/28/23 1653 Oral     SpO2 12/28/23 1653 98 %     Weight 12/28/23 1658 (!) 353 lb (160.1 kg)     Height 12/28/23 1658 5\' 11"  (1.803 m)     Head Circumference --      Peak Flow --      Pain Score 12/28/23 1657 0     Pain Loc --      Pain Education --      Exclude from Growth Chart --     Most recent vital signs: Vitals:   12/28/23 1653  BP: (!) 180/113  Pulse: (!) 115  Resp: (!) 22  Temp: 98.3 F (36.8 C)  SpO2: 98%    General: Awake, alert, oriented.  CV:  Good peripheral perfusion.  Resp:  Normal effort.  Abd:  No distention.  Other:  Left foot in boot.    ED Results / Procedures / Treatments   Labs (all labs ordered are listed, but only abnormal results are displayed) Labs Reviewed  COMPREHENSIVE METABOLIC PANEL - Abnormal; Notable for the following components:      Result Value   CO2 18 (*)    Glucose, Bld 255 (*)    All other components within normal limits  SALICYLATE LEVEL - Abnormal;  Notable for the following components:   Salicylate Lvl <7.0 (*)    All other components within normal limits  ACETAMINOPHEN LEVEL - Abnormal; Notable for the following components:   Acetaminophen (Tylenol), Serum <10 (*)    All other components within normal limits  CBC - Abnormal; Notable for the following components:   RBC 5.12 (*)    Hemoglobin 15.4 (*)    All other components within normal limits  URINE DRUG SCREEN, QUALITATIVE ([facility] ONLY) - Abnormal; Notable for the following components:   Cannabinoid 50 Ng, Ur [place] POSITIVE (*)    All other components within normal limits  ETHANOL  POC URINE PREG, ED     EKG  None   RADIOLOGY None  PROCEDURES:  Critical Care performed: No   MEDICATIONS ORDERED IN ED: Medications - No data to display   IMPRESSION / MDM / ASSESSMENT AND PLAN / ED COURSE  I reviewed the triage vital signs and the nursing notes.  Differential diagnosis includes, but is not limited to, SI, depression, drug induced mood disorder, secondary gain.  Patient's presentation is most consistent with acute presentation with potential threat to life or bodily function.   Patient presented to the emergency department today because of concerns for depression and suicidal ideation.  Will have psychiatry evaluate.  The patient has been placed in psychiatric observation due to the need to provide a safe environment for the patient while obtaining psychiatric consultation and evaluation, as well as ongoing medical and medication management to treat the patient's condition.  The patient has not been placed under full IVC at this time.      FINAL CLINICAL IMPRESSION(S) / ED DIAGNOSES   Final diagnoses:  Depression, unspecified depression type      Note:  This document was prepared using Dragon voice recognition software and may include unintentional dictation errors.    Phineas Semen, MD 12/28/23 [phone]
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
## 2023-12-28 NOTE — ED Notes (Addendum)
Pt changed into psych safe clothing.  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

## 2023-12-28 NOTE — ED Notes (Signed)
Provided pt with a phone so she can provide

## 2023-12-28 NOTE — ED Notes (Signed)
Pharmacy called at this moment to get med rec on pt
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
## 2023-12-29 ENCOUNTER — Inpatient Hospital Stay
Admission: AD | Admit: 2023-12-29 | Discharge: 2024-01-01 | DRG: 885 | Disposition: A | Discharge status: Home / Self Care | Payer: MEDICAID | Source: Intra-hospital | Attending: Psychiatry | Admitting: Psychiatry

## 2023-12-29 ENCOUNTER — Encounter: Payer: Self-pay | Admitting: Psychiatric/Mental Health

## 2023-12-29 DIAGNOSIS — F4310 Post-traumatic stress disorder, unspecified: Secondary | ICD-10-CM | POA: Diagnosis present

## 2023-12-29 DIAGNOSIS — Z6841 Body Mass Index (BMI) 40.0 and over, adult: Secondary | ICD-10-CM

## 2023-12-29 DIAGNOSIS — Z8673 Personal history of transient ischemic attack (TIA), and cerebral infarction without residual deficits: Secondary | ICD-10-CM | POA: Diagnosis not present

## 2023-12-29 DIAGNOSIS — E669 Obesity, unspecified: Secondary | ICD-10-CM | POA: Diagnosis present

## 2023-12-29 DIAGNOSIS — G4733 Obstructive sleep apnea (adult) (pediatric): Secondary | ICD-10-CM | POA: Diagnosis present

## 2023-12-29 DIAGNOSIS — I10 Essential (primary) hypertension: Secondary | ICD-10-CM | POA: Diagnosis present

## 2023-12-29 DIAGNOSIS — F315 Bipolar disorder, current episode depressed, severe, with psychotic features: Principal | ICD-10-CM | POA: Diagnosis present

## 2023-12-29 DIAGNOSIS — F419 Anxiety disorder, unspecified: Secondary | ICD-10-CM | POA: Diagnosis present

## 2023-12-29 DIAGNOSIS — Z62810 Personal history of physical and sexual abuse in childhood: Secondary | ICD-10-CM | POA: Diagnosis not present

## 2023-12-29 DIAGNOSIS — Z9049 Acquired absence of other specified parts of digestive tract: Secondary | ICD-10-CM

## 2023-12-29 DIAGNOSIS — R45851 Suicidal ideations: Secondary | ICD-10-CM | POA: Diagnosis present

## 2023-12-29 DIAGNOSIS — F17210 Nicotine dependence, cigarettes, uncomplicated: Secondary | ICD-10-CM | POA: Diagnosis present

## 2023-12-29 DIAGNOSIS — F250 Schizoaffective disorder, bipolar type: Secondary | ICD-10-CM | POA: Diagnosis present

## 2023-12-29 DIAGNOSIS — G4700 Insomnia, unspecified: Secondary | ICD-10-CM | POA: Diagnosis present

## 2023-12-29 DIAGNOSIS — Z794 Long term (current) use of insulin: Secondary | ICD-10-CM

## 2023-12-29 DIAGNOSIS — F3130 Bipolar disorder, current episode depressed, mild or moderate severity, unspecified: Secondary | ICD-10-CM | POA: Insufficient documentation

## 2023-12-29 DIAGNOSIS — E119 Type 2 diabetes mellitus without complications: Secondary | ICD-10-CM

## 2023-12-29 DIAGNOSIS — Z608 Other problems related to social environment: Secondary | ICD-10-CM | POA: Diagnosis present

## 2023-12-29 DIAGNOSIS — F319 Bipolar disorder, unspecified: Principal | ICD-10-CM | POA: Diagnosis present

## 2023-12-29 DIAGNOSIS — F633 Trichotillomania: Secondary | ICD-10-CM | POA: Diagnosis present

## 2023-12-29 DIAGNOSIS — Z5900 Homelessness unspecified: Secondary | ICD-10-CM

## 2023-12-29 DIAGNOSIS — Z9151 Personal history of suicidal behavior: Secondary | ICD-10-CM | POA: Diagnosis not present

## 2023-12-29 DIAGNOSIS — Z7984 Long term (current) use of oral hypoglycemic drugs: Secondary | ICD-10-CM | POA: Diagnosis not present

## 2023-12-29 DIAGNOSIS — Z9152 Personal history of nonsuicidal self-harm: Secondary | ICD-10-CM

## 2023-12-29 DIAGNOSIS — F259 Schizoaffective disorder, unspecified: Secondary | ICD-10-CM | POA: Diagnosis present

## 2023-12-29 DIAGNOSIS — Z880 Allergy status to penicillin: Secondary | ICD-10-CM | POA: Diagnosis not present

## 2023-12-29 DIAGNOSIS — Z5986 Financial insecurity: Secondary | ICD-10-CM

## 2023-12-29 DIAGNOSIS — F449 Dissociative and conversion disorder, unspecified: Secondary | ICD-10-CM | POA: Diagnosis present

## 2023-12-29 LAB — GLUCOSE, CAPILLARY
Glucose-Capillary: 253 mg/dL — ABNORMAL HIGH (ref 70–99)
Glucose-Capillary: 199 mg/dL — ABNORMAL HIGH (ref 70–99)
Glucose-Capillary: 340 mg/dL — ABNORMAL HIGH (ref 70–99)
Glucose-Capillary: 265 mg/dL — ABNORMAL HIGH (ref 70–99)
Glucose-Capillary: 238 mg/dL — ABNORMAL HIGH (ref 70–99)

## 2023-12-29 MED ORDER — HALOPERIDOL LACTATE 5 MG/ML IJ SOLN
5.0000 mg | Freq: Three times a day (TID) | INTRAMUSCULAR | Status: DC | PRN
Start: 2023-12-29 — End: 2024-01-01

## 2023-12-29 MED ORDER — DIPHENHYDRAMINE HCL 50 MG/ML IJ SOLN
50.0000 mg | Freq: Three times a day (TID) | INTRAMUSCULAR | Status: DC | PRN
Start: 2023-12-29 — End: 2024-01-01

## 2023-12-29 MED ORDER — HALOPERIDOL LACTATE 5 MG/ML IJ SOLN
10.0000 mg | Freq: Three times a day (TID) | INTRAMUSCULAR | Status: DC | PRN
Start: 2023-12-29 — End: 2024-01-01

## 2023-12-29 MED ORDER — INSULIN ASPART 100 UNIT/ML FLEXPEN
15.0000 [IU] | PEN_INJECTOR | Freq: Three times a day (TID) | SUBCUTANEOUS | Status: DC
  Filled 2023-12-29: qty 3

## 2023-12-29 MED ORDER — OXYCODONE HCL 5 MG PO TABS
5.0000 mg | ORAL_TABLET | Freq: Three times a day (TID) | ORAL | Status: DC | PRN
  Administered 2023-12-29 – 2023-12-31 (×4): 5 mg via ORAL
  Filled 2023-12-29 (×4): qty 1

## 2023-12-29 MED ORDER — DIPHENHYDRAMINE HCL 50 MG/ML IJ SOLN: 50.0000 mg | Freq: Three times a day (TID) | INTRAMUSCULAR | Status: DC | PRN

## 2023-12-29 MED ORDER — MAGNESIUM HYDROXIDE 400 MG/5ML PO SUSP: 30.0000 mL | Freq: Every day | ORAL | Status: DC | PRN

## 2023-12-29 MED ORDER — LORAZEPAM 2 MG/ML IJ SOLN: 2.0000 mg | Freq: Three times a day (TID) | INTRAMUSCULAR | Status: DC | PRN

## 2023-12-29 MED ORDER — SERTRALINE HCL 25 MG PO TABS
50.0000 mg | ORAL_TABLET | Freq: Every day | ORAL | Status: DC
Start: 2023-12-29 — End: 2024-01-01
  Administered 2023-12-29 – 2024-01-01 (×4): 50 mg via ORAL
  Filled 2023-12-29 (×4): qty 2

## 2023-12-29 MED ORDER — ALUM & MAG HYDROXIDE-SIMETH 200-200-20 MG/5ML PO SUSP: 30.0000 mL | ORAL | Status: DC | PRN

## 2023-12-29 MED ORDER — HYDROXYZINE HCL 25 MG PO TABS: 25.0000 mg | ORAL_TABLET | Freq: Three times a day (TID) | ORAL | Status: DC | PRN

## 2023-12-29 MED ORDER — INSULIN ASPART 100 UNIT/ML IJ SOLN
15.0000 [IU] | Freq: Three times a day (TID) | INTRAMUSCULAR | Status: DC
  Administered 2023-12-29 – 2024-01-01 (×11): 15 [IU] via SUBCUTANEOUS
  Filled 2023-12-29 (×11): qty 1

## 2023-12-29 MED ORDER — HALOPERIDOL 5 MG PO TABS: 5.0000 mg | ORAL_TABLET | Freq: Three times a day (TID) | ORAL | Status: DC | PRN

## 2023-12-29 MED ORDER — ACETAMINOPHEN 325 MG PO TABS
650.0000 mg | ORAL_TABLET | Freq: Four times a day (QID) | ORAL | Status: DC | PRN
  Administered 2023-12-30: 650 mg via ORAL
  Filled 2023-12-29: qty 2

## 2023-12-29 MED ORDER — TIZANIDINE HCL 4 MG PO TABS
4.0000 mg | ORAL_TABLET | Freq: Three times a day (TID) | ORAL | Status: DC
Start: 2023-12-29 — End: 2024-01-01
  Administered 2023-12-29 – 2024-01-01 (×11): 4 mg via ORAL
  Filled 2023-12-29 (×12): qty 1

## 2023-12-29 MED ORDER — ARIPIPRAZOLE 5 MG PO TABS
5.0000 mg | ORAL_TABLET | Freq: Every day | ORAL | Status: DC
Start: 2023-12-29 — End: 2024-01-01
  Administered 2023-12-29 – 2024-01-01 (×4): 5 mg via ORAL
  Filled 2023-12-29 (×4): qty 1

## 2023-12-29 MED ORDER — DIPHENHYDRAMINE HCL 25 MG PO CAPS: 50.0000 mg | ORAL_CAPSULE | Freq: Three times a day (TID) | ORAL | Status: DC | PRN

## 2023-12-29 NOTE — Progress Notes (Signed)
   12/29/23 0913  Psych Admission Type (Psych Patients Only)  Admission Status Voluntary  Psychosocial Assessment  Patient Complaints Anxiety  Eye Contact Fair  Facial Expression Animated  Affect Anxious  Speech Soft  Interaction Assertive  Motor Activity Unsteady  Appearance/Hygiene In scrubs  Behavior Characteristics Cooperative;Anxious  Mood Anxious;Pleasant  Thought Process  Coherency WDL  Content WDL  Delusions None reported or observed  Perception WDL  Hallucination None reported or observed  Judgment Poor  Confusion None  Danger to Self  Current suicidal ideation? Denies  Agreement Not to Harm Self Yes  Description of Agreement Verbal  Danger to Others  Danger to Others None reported or observed
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
## 2023-12-29 NOTE — Tx Team (Signed)
Initial Treatment Plan 12/29/2023 2:42 AM Cassandra Flynn ZOX:[id number]    PATIENT STRESSORS: Financial difficulties   Traumatic event     PATIENT STRENGTHS: Motivation for treatment/growth    PATIENT IDENTIFIED PROBLEMS: Family conflict   Suicidal Ideations                   DISCHARGE CRITERIA:  Safe-care adequate arrangements made  PRELIMINARY DISCHARGE PLAN: Placement in alternative living arrangements  PATIENT/FAMILY INVOLVEMENT: This treatment plan has been presented to and reviewed with the patient, Cassandra Flynn. The patient and family have been given the opportunity to ask questions and make suggestions.  Neysa Bonito, RN 12/29/2023, 2:42 AM
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
## 2023-12-29 NOTE — Plan of Care (Signed)
°  Problem: Education: °Goal: Emotional status will improve °Outcome: Progressing °Goal: Mental status will improve °Outcome: Progressing °Goal: Verbalization of understanding the information provided will improve °Outcome: Progressing °  °

## 2023-12-29 NOTE — Plan of Care (Signed)
New Admission. Problem: Education: Goal: Knowledge of [facility] Education information/materials will improve Outcome: Progressing   Problem: Activity: Goal: Interest or engagement in activities will improve Outcome: Progressing

## 2023-12-29 NOTE — Group Note (Signed)
LCSW Group Therapy Note   Group Date: 12/29/2023 Start Time: 1330 End Time: 1405    Type of Therapy and Topic:  Goals after Discharge  Participation Level:  Did Not Attend    Summary of Patient Progress:  The patient did not attend group.    Marshell Levan, LCSWA 12/29/2023  2:19 PM

## 2023-12-29 NOTE — BH Assessment (Cosign Needed)
Patient brought home medications to [facility]. Oxycodone 8 tablets and Tizanidine 1 tablet was counted with nurse Elmon Else. Medications were taken to pharmacy in a security bag.

## 2023-12-30 LAB — GLUCOSE, CAPILLARY
Glucose-Capillary: 202 mg/dL — ABNORMAL HIGH (ref 70–99)
Glucose-Capillary: 240 mg/dL — ABNORMAL HIGH (ref 70–99)
Glucose-Capillary: 268 mg/dL — ABNORMAL HIGH (ref 70–99)
Glucose-Capillary: 271 mg/dL — ABNORMAL HIGH (ref 70–99)

## 2023-12-30 NOTE — Progress Notes (Signed)
   12/30/23 0843  Psych Admission Type (Psych Patients Only)  Admission Status Voluntary  Psychosocial Assessment  Patient Complaints Anxiety  Eye Contact Fair  Facial Expression Animated  Affect Anxious  Speech Soft  Interaction Assertive  Motor Activity Unsteady  Appearance/Hygiene In scrubs  Behavior Characteristics Cooperative;Anxious  Mood Anxious;Pleasant  Thought Process  Coherency WDL  Content WDL  Delusions None reported or observed  Perception WDL  Hallucination None reported or observed  Judgment Poor  Confusion None  Danger to Self  Current suicidal ideation? Denies  Agreement Not to Harm Self Yes  Description of Agreement Verbal  Danger to Others  Danger to Others None reported or observed

## 2023-12-30 NOTE — Plan of Care (Signed)
Cassandra Flynn is a 28 y.o. female patient. No diagnosis found. Past Medical History:  Diagnosis Date   Asthma    Diabetes mellitus without complication (HCC)    Diabetic ketoacidosis (HCC) 06/22/2019   Hypertension    Sleep apnea    Current Facility-Administered Medications  Medication Dose Route Frequency Provider Last Rate Last Admin   acetaminophen (TYLENOL) tablet 650 mg  650 mg Oral Q6H PRN Dixon, Rashaun M, NP       alum & mag hydroxide-simeth (MAALOX/MYLANTA) 200-200-20 MG/5ML suspension 30 mL  30 mL Oral Q4H PRN Dixon, Rashaun M, NP       ARIPiprazole (ABILIFY) tablet 5 mg  5 mg Oral Daily Remington, Amber E, NP   5 mg at 12/29/23 1659   haloperidol (HALDOL) tablet 5 mg  5 mg Oral TID PRN Jearld Lesch, NP       And   diphenhydrAMINE (BENADRYL) capsule 50 mg  50 mg Oral TID PRN Jearld Lesch, NP       haloperidol lactate (HALDOL) injection 5 mg  5 mg Intramuscular TID PRN Jearld Lesch, NP       And   diphenhydrAMINE (BENADRYL) injection 50 mg  50 mg Intramuscular TID PRN Jearld Lesch, NP       And   LORazepam (ATIVAN) injection 2 mg  2 mg Intramuscular TID PRN Jearld Lesch, NP       haloperidol lactate (HALDOL) injection 10 mg  10 mg Intramuscular TID PRN Jearld Lesch, NP       And   diphenhydrAMINE (BENADRYL) injection 50 mg  50 mg Intramuscular TID PRN Jearld Lesch, NP       And   LORazepam (ATIVAN) injection 2 mg  2 mg Intramuscular TID PRN Jearld Lesch, NP       hydrOXYzine (ATARAX) tablet 25 mg  25 mg Oral TID PRN Jearld Lesch, NP       insulin aspart (novoLOG) injection 15 Units  15 Units Subcutaneous TID WC Sarina Ill, DO   15 Units at 12/29/23 1659   magnesium hydroxide (MILK OF MAGNESIA) suspension 30 mL  30 mL Oral Daily PRN Jearld Lesch, NP       oxyCODONE (Oxy IR/ROXICODONE) immediate release tablet 5 mg  5 mg Oral Q8H PRN Jearld Lesch, NP   5 mg at 12/30/23 0038   sertraline (ZOLOFT) tablet 50 mg  50 mg  Oral Daily Remington, Amber E, NP   50 mg at 12/29/23 1658   tiZANidine (ZANAFLEX) tablet 4 mg  4 mg Oral TID Jearld Lesch, NP   4 mg at 12/29/23 1659   Allergies  Allergen Reactions   Penicillins Itching, Swelling and Rash    Has patient had a PCN reaction causing immediate rash, facial/tongue/throat swelling, SOB or lightheadedness with hypotension: Unknown Has patient had a PCN reaction causing severe rash involving mucus membranes or skin necrosis: Unknown Has patient had a PCN reaction that required hospitalization: Unknown Has patient had a PCN reaction occurring within the last 10 years: Unknown If all of the above answers are "NO", then may proceed with Cephalosporin use.    Principal Problem:   Schizoaffective disorder, bipolar type (HCC) Active Problems:   Suicidal ideation   Post traumatic stress disorder (PTSD)  Blood pressure 103/73, pulse 96, temperature (!) 97.2 F (36.2 C), resp. rate 18, height 5\' 11"  (1.803 m), weight (!) 161 kg, last menstrual period 12/09/2023, SpO2 99%.  Subjective Objective Assessment & Plan  Brezlyn Manrique B Mililani Murthy 12/30/2023

## 2023-12-30 NOTE — Group Note (Signed)
Date:  12/30/2023 Time:  9:35 PM  Group Topic/Focus:  Healthy Communication:   The focus of this group is to discuss communication, barriers to communication, as well as healthy ways to communicate with others.    Participation Level:  Active  Participation Quality:  Appropriate and Attentive  Affect:  Appropriate  Cognitive:  Alert and Appropriate  Insight: Appropriate, Good, and Improving  Engagement in Group:  Developing/Improving and Engaged  Modes of Intervention:  Discussion, Rapport Building, and Support  Additional Comments:     Cassandra Flynn 12/30/2023, 9:35 PM

## 2023-12-30 NOTE — Plan of Care (Signed)
°  Problem: Education: °Goal: Emotional status will improve °Outcome: Progressing °Goal: Mental status will improve °Outcome: Progressing °Goal: Verbalization of understanding the information provided will improve °Outcome: Progressing °  °

## 2023-12-30 NOTE — Progress Notes (Signed)
[facility] MD Progress Note  12/30/2023 1405 Cassandra Flynn  MRN:  [id number]  Subjective:  [name] is a 28 year old single African American female with a psychiatric history of bipolar disorder, conversion disorder, ODD, trichotillomania, PTSD, and past medical history significant for CVA [2023], diabetes, OSA, and recent fracture of left tibia and fibula. She initially presented to ED via EMS for ankle injury. She is admitted to inpatient behavioral health unit for auditory hallucinations and suicidal ideation.   Chart reviewed, case discussed with multidisciplinary team, and patient seen during rounds. She started trials of aripiprazole for mood stabilization, hallucinations, and (off-label) trichotillomania, and sertraline for PTSD. She complains of headache today but resolved with OTC pain relief. Surena reports mild improvement in mood. The voices have significantly decreased and not causing acute distress. She denies thoughts of harming herself and others today. States she slept well overnight and has been eating all meals. She is visible in the milieu and participating in groups.  Discharge planning in process. Yolanda plans to stay with her boyfriend upon discharge. 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 She reports initial PT appointent on Tuesday at 2pm that she feels is important to attend. Patient is not established with outpatient providers.  Principal Problem: Bipolar I disorder, current or most recent episode depressed, with psychotic features (HCC) Diagnosis: Principal Problem:   Bipolar I disorder, current or most recent episode depressed, with psychotic features (HCC) Active Problems:   Suicidal ideation   Post traumatic stress disorder (PTSD)  Total Time spent with patient: 30 minutes  Past Psychiatric History: see h&p  Past Medical History:  Past Medical History:  Diagnosis Date   Asthma    Diabetes mellitus without complication  (HCC)    Diabetic ketoacidosis (HCC) 06/22/2019   Hypertension    Sleep apnea     Past Surgical History:  Procedure Laterality Date   CESAREAN SECTION     CHOLECYSTECTOMY     ORIF FIBULA FRACTURE  10/22/2023   Procedure: OPEN REDUCTION INTERNAL FIXATION (ORIF) FIBULA FRACTURE;  Surgeon: Cecil Cranker, MD;  Location: [facility] ORS;  Service: Orthopedics;;   SKIN GRAFT     TIBIA IM NAIL INSERTION Left 10/22/2023   Procedure: INTRAMEDULLARY (IM) NAIL TIBIAL;  Surgeon: Cecil Cranker, MD;  Location: [facility] ORS;  Service: Orthopedics;  Laterality: Left;   TONSILLECTOMY     Family History: History reviewed. No pertinent family history. Family Psychiatric  History: see h&p Social History:  Social History   Substance and Sexual Activity  Alcohol Use No     Social History   Substance and Sexual Activity  Drug Use No    Social History   Socioeconomic History   Marital status: Single    Spouse name: Not on file   Number of children: Not on file   Years of education: Not on file   Highest education level: Not on file  Occupational History   Not on file  Tobacco Use   Smoking status: Every Day    Types: Cigarettes   Smokeless tobacco: Never  Vaping Use   Vaping status: Never Used  Substance and Sexual Activity   Alcohol use: No   Drug use: No   Sexual activity: Never    Birth control/protection: Injection  Other Topics Concern   Not on file  Social History Narrative   Not on file   Social Drivers of Health   Financial Resource Strain: Not on file  Food Insecurity: No Food Insecurity (12/29/2023)   Hunger Vital Sign    Worried About Running Out of Food in the Last Year: Never true    Ran Out of Food in the Last Year: Never true  Transportation Needs: No Transportation Needs (12/29/2023)   PRAPARE - Administrator, Civil Service (Medical): No    Lack of Transportation (Non-Medical): No  Physical Activity: Not on file  Stress: Not on file   Social Connections: Not on file   Additional Social History:                         Sleep: Good  Appetite:  Good  Current Medications: Current Facility-Administered Medications  Medication Dose Route Frequency Provider Last Rate Last Admin   acetaminophen (TYLENOL) tablet 650 mg  650 mg Oral Q6H PRN Jearld Lesch, NP   650 mg at 12/30/23 0917   alum & mag hydroxide-simeth (MAALOX/MYLANTA) 200-200-20 MG/5ML suspension 30 mL  30 mL Oral Q4H PRN Jearld Lesch, NP       ARIPiprazole (ABILIFY) tablet 5 mg  5 mg Oral Daily Landyn Buckalew E, NP   5 mg at 12/30/23 0843   haloperidol (HALDOL) tablet 5 mg  5 mg Oral TID PRN Jearld Lesch, NP       And   diphenhydrAMINE (BENADRYL) capsule 50 mg  50 mg Oral TID PRN Jearld Lesch, NP       haloperidol lactate (HALDOL) injection 5 mg  5 mg Intramuscular TID PRN Jearld Lesch, NP       And   diphenhydrAMINE (BENADRYL) injection 50 mg  50 mg Intramuscular TID PRN Jearld Lesch, NP       And   LORazepam (ATIVAN) injection 2 mg  2 mg Intramuscular TID PRN Jearld Lesch, NP       haloperidol lactate (HALDOL) injection 10 mg  10 mg Intramuscular TID PRN Jearld Lesch, NP       And   diphenhydrAMINE (BENADRYL) injection 50 mg  50 mg Intramuscular TID PRN Jearld Lesch, NP       And   LORazepam (ATIVAN) injection 2 mg  2 mg Intramuscular TID PRN Jearld Lesch, NP       hydrOXYzine (ATARAX) tablet 25 mg  25 mg Oral TID PRN Jearld Lesch, NP       insulin aspart (novoLOG) injection 15 Units  15 Units Subcutaneous TID WC Sarina Ill, DO   15 Units at 12/30/23 1702   magnesium hydroxide (MILK OF MAGNESIA) suspension 30 mL  30 mL Oral Daily PRN Jearld Lesch, NP       oxyCODONE (Oxy IR/ROXICODONE) immediate release tablet 5 mg  5 mg Oral Q8H PRN Jearld Lesch, NP   5 mg at 12/30/23 0038   sertraline (ZOLOFT) tablet 50 mg  50 mg Oral Daily Sherhonda Gaspar E, NP   50 mg at 12/30/23 0843    tiZANidine (ZANAFLEX) tablet 4 mg  4 mg Oral TID Jearld Lesch, NP   4 mg at 12/30/23 1702    Lab Results:  Results for orders placed or performed during the hospital encounter of 12/29/23 (from the past 48 hours)  Glucose, capillary     Status: Abnormal   Collection Time: 12/29/23  2:28 AM  Result Value Ref Range   Glucose-Capillary 253 (H) 70 - 99 mg/dL    Comment: Glucose reference range applies  only to samples taken after fasting for at least 8 hours.  Glucose, capillary     Status: Abnormal   Collection Time: 12/29/23  7:41 AM  Result Value Ref Range   Glucose-Capillary 199 (H) 70 - 99 mg/dL    Comment: Glucose reference range applies only to samples taken after fasting for at least 8 hours.  Glucose, capillary     Status: Abnormal   Collection Time: 12/29/23  9:08 AM  Result Value Ref Range   Glucose-Capillary 340 (H) 70 - 99 mg/dL    Comment: Glucose reference range applies only to samples taken after fasting for at least 8 hours.  Glucose, capillary     Status: Abnormal   Collection Time: 12/29/23 12:05 PM  Result Value Ref Range   Glucose-Capillary 265 (H) 70 - 99 mg/dL    Comment: Glucose reference range applies only to samples taken after fasting for at least 8 hours.  Glucose, capillary     Status: Abnormal   Collection Time: 12/29/23  4:30 PM  Result Value Ref Range   Glucose-Capillary 238 (H) 70 - 99 mg/dL    Comment: Glucose reference range applies only to samples taken after fasting for at least 8 hours.  Glucose, capillary     Status: Abnormal   Collection Time: 12/30/23  7:45 AM  Result Value Ref Range   Glucose-Capillary 202 (H) 70 - 99 mg/dL    Comment: Glucose reference range applies only to samples taken after fasting for at least 8 hours.  Glucose, capillary     Status: Abnormal   Collection Time: 12/30/23 11:30 AM  Result Value Ref Range   Glucose-Capillary 240 (H) 70 - 99 mg/dL    Comment: Glucose reference range applies only to samples taken after  fasting for at least 8 hours.  Glucose, capillary     Status: Abnormal   Collection Time: 12/30/23  4:07 PM  Result Value Ref Range   Glucose-Capillary 268 (H) 70 - 99 mg/dL    Comment: Glucose reference range applies only to samples taken after fasting for at least 8 hours.    Blood Alcohol level:  Lab Results  Component Value Date   ETH <10 12/28/2023   ETH <10 10/27/2023    Metabolic Disorder Labs: Lab Results  Component Value Date   HGBA1C 11.2 (H) 10/22/2023   MPG 274.74 10/22/2023   MPG 286.22 04/03/2021   Lab Results  Component Value Date   PROLACTIN 18.4 06/17/2014   Lab Results  Component Value Date   CHOL 205 (H) 04/03/2021   TRIG 171 (H) 04/03/2021   HDL 37 (L) 04/03/2021   CHOLHDL 5.5 04/03/2021   VLDL 34 04/03/2021   LDLCALC 134 (H) 04/03/2021   LDLCALC 87 06/17/2014    Physical Findings: AIMS:  , ,  ,  ,    CIWA:    COWS:     Musculoskeletal: Strength & Muscle Tone: within normal limits Gait & Station: unable to stand, left leg non weight-bearing Patient leans: N/A  Psychiatric Specialty Exam:  Presentation  General Appearance:  Appropriate for Environment  Eye Contact: Good  Speech: Clear and Coherent  Speech Volume: Normal  Handedness:No data recorded  Mood and Affect  Mood: Dysphoric  Affect: Appropriate; Congruent   Thought Process  Thought Processes: Coherent  Descriptions of Associations:Intact  Orientation:Full (Time, Place and Person)  Thought Content:Logical  History of Schizophrenia/Schizoaffective disorder:Yes  Duration of Psychotic Symptoms:Greater than six months (auditory hallucinations occur within context of mood disturbance)  Hallucinations:Hallucinations: Auditory Description of Auditory Hallucinations: non-commanding  Ideas of Reference:None  Suicidal Thoughts:Suicidal Thoughts: No  Homicidal Thoughts:Homicidal Thoughts: No   Sensorium  Memory:Immediate Good; Recent Good; Remote  Good  Judgment:Fair  Insight:Fair   Executive Functions  Concentration:Fair  Attention Span:Fair  Recall:Good  Fund of Knowledge:Good  Language:Good   Psychomotor Activity  Psychomotor Activity:Psychomotor Activity: Normal   Assets  Assets:Communication Skills; Desire for Improvement; Intimacy; Resilience; Social Support   Sleep  Sleep:Sleep: Good    Physical Exam: Physical Exam ROS Blood pressure 133/75, pulse 95, temperature (!) 96.8 F (36 C), resp. rate 16, height 5\' 11"  (1.803 m), weight (!) 161 kg, last menstrual period 12/09/2023, SpO2 100%. Body mass index is 49.51 kg/m.   Treatment Plan Summary: Safety and Monitoring:             -- Voluntary admission to inpatient psychiatric unit for safety, stabilization and treatment             -- Daily contact with patient to assess and evaluate symptoms and progress in treatment             -- Patient's case to be discussed in multi-disciplinary team meeting             -- Observation Level : q15 minute checks             -- Vital signs:  q12 hours             -- Precautions: suicide, falls   2. Psychiatric Diagnoses and Treatment:              -- Start Abilify 5mg  by mouth daily for bipolar disorder, psychosis, and trichotillomania             -- Start sertraline 50mg  by mouth daily for PTSD             -- Start hydroxyzine 25mg  by mouth three times daily as needed for anxiety --  The risks/benefits/side-effects/alternatives to this medication were discussed in detail with the patient and time was given for questions. The patient consents to medication trial.              -- Metabolic profile and EKG monitoring obtained while on an atypical antipsychotic (Lipid Panel: ordered, pending;  HbgA1c: 11.2; QTc: EKG ordered, pending)              -- Encouraged patient to participate in unit milieu and in scheduled group therapies              -- Short Term Goals: Ability to identify changes in lifestyle to reduce  recurrence of condition will improve, Ability to verbalize feelings will improve, Ability to disclose and discuss suicidal ideas, Ability to demonstrate self-control will improve, Ability to identify and develop effective coping behaviors will improve, Compliance with prescribed medications will improve, and Ability to identify triggers associated with substance abuse/mental health issues will improve             -- Long Term Goals: Improvement in symptoms so as ready for discharge         3. Medical Issues Being Addressed:              -- Nicotine patch 21mg /24 hours ordered for tobacco use disorder; Smoking cessation encouraged -- Restart novolog 15 units by mouth three times daily with meals for DM -- Restart oxycodone 5mg  by mouth every 8 hours PRN severe pain -- Restart tizanidine 4mg  three times a day  for pain   4. Discharge Planning:              -- Social work and case management to assist with discharge planning and identification of hospital follow-up needs prior to discharge             -- Estimated LOS: 5-7 days             -- Discharge Concerns: Need to establish a safety plan; Medication compliance and effectiveness             -- Discharge Goals: Safe housing with outpatient referrals for mental health follow-up including medication management/psychotherapy  Kratos Ruscitti Robyne Peers, NP 12/30/2023, 5:38 PM

## 2023-12-31 DIAGNOSIS — F315 Bipolar disorder, current episode depressed, severe, with psychotic features: Secondary | ICD-10-CM

## 2023-12-31 LAB — GLUCOSE, CAPILLARY
Glucose-Capillary: 190 mg/dL — ABNORMAL HIGH (ref 70–99)
Glucose-Capillary: 279 mg/dL — ABNORMAL HIGH (ref 70–99)
Glucose-Capillary: 253 mg/dL — ABNORMAL HIGH (ref 70–99)
Glucose-Capillary: 287 mg/dL — ABNORMAL HIGH (ref 70–99)

## 2023-12-31 NOTE — Group Note (Addendum)
Recreation Therapy Group Note   Group Topic:Health and Wellness  Group Date: 12/31/2023 Start Time: 1000 End Time: 1050 Facilitators: Rosina Lowenstein, LRT, CTRS Location:  Craft Room  Activity Description/Intervention: Therapeutic Drumming. Patients with peers and staff were given the opportunity to engage in a leader facilitated [facility] Group Empowerment Drumming Circle with staff from the [employer], in partnership with [employer]. Teaching laboratory technician and trained [employer], Theodoro Doing leading with LRT observing and documenting intervention and pt response. This evidenced-based practice targets 7 areas of health and wellbeing in the human experience including: stress-reduction, exercise, self-expression, camaraderie/support, nurturing, spirituality, and music-making (leisure).   Goal Area(s) Addresses:  Patient will engage in pro-social way in music group.  Patient will follow directions of drum leader on the first prompt. Patient will demonstrate no behavioral issues during group.  Patient will identify if a reduction in stress level occurs as a result of participation in therapeutic drum circle.    Education: Leisure exposure, Pharmacologist, Musical expression, Discharge Planning   Clinical Observations/Individualized Feedback: Cassandra Flynn actively engaged in therapeutic drumming exercise and discussions. Pt was appropriate with peers, staff, and musical equipment for duration of programming.  Pt identified "good" as their feeling after participation in music-based programming. Pt affect congruent with verbalized emotion.    Plan: Continue to engage patient in RT group sessions 2-3x/week.   [address] LRT, CTRS 12/31/2023 1:56 PM

## 2023-12-31 NOTE — [facility] Suicide Risk Assessment (Signed)
[facility] INPATIENT:  Family/Significant Other Suicide Prevention Education  Suicide Prevention Education:  Patient Refusal for Family/Significant Other Suicide Prevention Education: The patient Cassandra Flynn has refused to provide written consent for family/significant other to be provided Family/Significant Other Suicide Prevention Education during admission and/or prior to discharge.  Physician notified.  SPE completed with pt, as pt refused to consent to family contact. SPI pamphlet provided to pt and pt was encouraged to share information with support network, ask questions, and talk about any concerns relating to SPE. Pt denies access to guns/firearms and verbalized understanding of information provided. Mobile Crisis information also provided to pt.    Lowry Ram 12/31/2023, 1:52 PM

## 2023-12-31 NOTE — Progress Notes (Signed)
  [facility] Adult Case Management Discharge Plan :  Will you be returning to the same living situation after discharge:  Yes,  Patient to return home.  At discharge, do you have transportation home?: Yes,  Patient to be transported by family.  Do you have the ability to pay for your medications: Yes, [employer]   Release of information consent forms completed and in the chart;  Patient's signature needed at discharge.  Patient to Follow up at:  Follow-up Information     [facility] . Go to.   Why: In person appointment 01/09/24 at 11 AM.   Please feel free to contact [facility] peer support speciailst Unk Pinto at [phone]. Contact information: [address] [place] [address] [phone]                 Next level of care provider has access to [facility] Link:no  Safety Planning and Suicide Prevention discussed: No.Patient declined. SPE completed with pt, as pt refused to consent to family contact. SPI pamphlet provided to pt and pt was encouraged to share information with support network, ask questions, and talk about any concerns relating to SPE. Pt denies access to guns/firearms and verbalized understanding of information provided. Mobile Crisis information also provided to pt.  SPE written material given at discharge.      Has patient been referred to the Quitline?: Patient refused referral for treatment  Patient has been referred for addiction treatment: Yes, the patient will follow up with an outpatient provider for substance use disorder. Psychiatrist/APP: appointment made  Lowry Ram, LCSW 12/31/2023, 1:50 PM
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
## 2023-12-31 NOTE — Plan of Care (Signed)
  Problem: Activity: Goal: Interest or engagement in activities will improve Outcome: Progressing Goal: Sleeping patterns will improve Outcome: Progressing   Problem: Education: Goal: Knowledge of [facility] Education information/materials will improve Outcome: Progressing

## 2023-12-31 NOTE — Group Note (Signed)
Date:  12/31/2023 Time:  10:18 AM  Group Topic/Focus:  Goals Group:   The focus of this group is to help patients establish daily goals to achieve during treatment and discuss how the patient can incorporate goal setting into their daily lives to aide in recovery. Orientation:   The focus of this group is to educate the patient on the purpose and policies of crisis stabilization and provide a format to answer questions about their admission.  The group details unit policies and expectations of patients while admitted. Pop QUIZ: Cassandra Flynn    Participation Level:  Active  Participation Quality:  Appropriate and Attentive  Affect:  Appropriate  Cognitive:  Alert, Appropriate, and Oriented  Insight: Appropriate  Engagement in Group:  Developing/Improving, Engaged, and Supportive  Modes of Intervention:  Activity, Education, and Socialization  Additional Comments:    [name] 12/31/2023, 10:18 AM

## 2023-12-31 NOTE — Progress Notes (Signed)
   12/30/23 1941  Psych Admission Type (Psych Patients Only)  Admission Status Voluntary  Psychosocial Assessment  Patient Complaints Anxiety  Eye Contact Fair  Facial Expression Animated  Affect Anxious  Speech Soft  Interaction Assertive  Motor Activity Unsteady  Appearance/Hygiene In scrubs  Behavior Characteristics Cooperative;Anxious  Mood Anxious  Aggressive Behavior  Effect No apparent injury  Thought Process  Coherency WDL  Content WDL  Delusions None reported or observed  Perception WDL  Hallucination None reported or observed  Judgment Poor  Confusion None  Danger to Self  Current suicidal ideation? Denies  Agreement Not to Harm Self Yes  Description of Agreement verbal  Danger to Others  Danger to Others None reported or observed

## 2023-12-31 NOTE — Group Note (Signed)
Date:  12/31/2023 Time:  8:47 PM  Group Topic/Focus:  Wellness Toolbox:   The focus of this group is to discuss various aspects of wellness, balancing those aspects and exploring ways to increase the ability to experience wellness.  Patients will create a wellness toolbox for use upon discharge. Wrap-Up Group:   The focus of this group is to help patients review their daily goal of treatment and discuss progress on daily workbooks.    Participation Level:  Active  Participation Quality:  Appropriate and Attentive  Affect:  Appropriate and Excited  Cognitive:  Alert and Appropriate  Insight: Good  Engagement in Group:  Engaged  Modes of Intervention:  Discussion, Orientation, and Support  Additional Comments:     Katina Dung 12/31/2023, 8:47 PM

## 2023-12-31 NOTE — BH IP Treatment Plan (Signed)
Interdisciplinary Treatment and Diagnostic Plan Update  12/31/2023 Time of Session: 3:33pm Cassandra Flynn MRN: [id number]  Principal Diagnosis: Bipolar I disorder, current or most recent episode depressed, with psychotic features (HCC)  Secondary Diagnoses: Principal Problem:   Bipolar I disorder, current or most recent episode depressed, with psychotic features (HCC) Active Problems:   Suicidal ideation   Post traumatic stress disorder (PTSD)   Current Medications:  Current Facility-Administered Medications  Medication Dose Route Frequency Provider Last Rate Last Admin   acetaminophen (TYLENOL) tablet 650 mg  650 mg Oral Q6H PRN Jearld Lesch, NP   650 mg at 12/30/23 0917   alum & mag hydroxide-simeth (MAALOX/MYLANTA) 200-200-20 MG/5ML suspension 30 mL  30 mL Oral Q4H PRN Jearld Lesch, NP       ARIPiprazole (ABILIFY) tablet 5 mg  5 mg Oral Daily Remington, Amber E, NP   5 mg at 12/31/23 2841   haloperidol (HALDOL) tablet 5 mg  5 mg Oral TID PRN Jearld Lesch, NP       And   diphenhydrAMINE (BENADRYL) capsule 50 mg  50 mg Oral TID PRN Jearld Lesch, NP       haloperidol lactate (HALDOL) injection 5 mg  5 mg Intramuscular TID PRN Jearld Lesch, NP       And   diphenhydrAMINE (BENADRYL) injection 50 mg  50 mg Intramuscular TID PRN Jearld Lesch, NP       And   LORazepam (ATIVAN) injection 2 mg  2 mg Intramuscular TID PRN Jearld Lesch, NP       haloperidol lactate (HALDOL) injection 10 mg  10 mg Intramuscular TID PRN Jearld Lesch, NP       And   diphenhydrAMINE (BENADRYL) injection 50 mg  50 mg Intramuscular TID PRN Jearld Lesch, NP       And   LORazepam (ATIVAN) injection 2 mg  2 mg Intramuscular TID PRN Jearld Lesch, NP       hydrOXYzine (ATARAX) tablet 25 mg  25 mg Oral TID PRN Jearld Lesch, NP       insulin aspart (novoLOG) injection 15 Units  15 Units Subcutaneous TID WC Sarina Ill, DO   15 Units at 12/31/23 1155    magnesium hydroxide (MILK OF MAGNESIA) suspension 30 mL  30 mL Oral Daily PRN Jearld Lesch, NP       oxyCODONE (Oxy IR/ROXICODONE) immediate release tablet 5 mg  5 mg Oral Q8H PRN Jearld Lesch, NP   5 mg at 12/30/23 2100   sertraline (ZOLOFT) tablet 50 mg  50 mg Oral Daily Remington, Amber E, NP   50 mg at 12/31/23 3244   tiZANidine (ZANAFLEX) tablet 4 mg  4 mg Oral TID Jearld Lesch, NP   4 mg at 12/31/23 1155   PTA Medications: Medications Prior to Admission  Medication Sig Dispense Refill Last Dose/Taking   acetaminophen (TYLENOL) 325 MG tablet Take 2 tablets (650 mg total) by mouth every 6 (six) hours as needed for mild pain (pain score 1-3), moderate pain (pain score 4-6) or headache (or Fever >/= 101 - TAKE WITH OXYCODONE). (Patient not taking: Reported on 12/28/2023) 30 tablet 0    aspirin EC 325 MG tablet Take 1 tablet (325 mg total) by mouth 2 (two) times daily with a meal. (Patient not taking: Reported on 12/28/2023) 60 tablet 0    insulin aspart (NOVOLOG) 100 UNIT/ML FlexPen Inject 15 Units into the skin 3 (  three) times daily with meals. 15 units with meals with additional sliding scale dose based on blood sugar (0-15 units) as below - CBG 70 - 120: 0 units, CBG 121 - 150: 2 units, CBG 151 - 200: 3 units, CBG 201 - 250: 5 units, CBG 251 - 300: 8 units, CBG 301 - 350: 11 units, CBG 351 - 400: 15 units, CBG <70 or > 400 call MD 30 mL 3    insulin glargine (LANTUS SOLOSTAR) 100 UNIT/ML Solostar Pen Inject 55 Units into the skin daily. 66 mL 0    Insulin Pen Needle 32G X 4 MM MISC Use to inject insulin 4 times daily as directed 200 each 0    metFORMIN (GLUCOPHAGE) 500 MG tablet Take 2 tablets (1,000 mg total) by mouth 2 (two) times daily with a meal. 120 tablet 0    metFORMIN (GLUCOPHAGE-XR) 500 MG 24 hr tablet Take 1,000 mg by mouth 2 (two) times daily.      ondansetron (ZOFRAN-ODT) 4 MG disintegrating tablet Take 1 tablet (4 mg total) by mouth every 6 (six) hours as needed for nausea  or vomiting. (Patient not taking: Reported on 12/28/2023) 20 tablet 0    oxyCODONE (OXY IR/ROXICODONE) 5 MG immediate release tablet Take 1-2 tablets (5-10 mg total) by mouth every 4 (four) hours as needed for moderate pain (pain score 4-6) or severe pain (pain score 7-10). 40 tablet 0    oxyCODONE (ROXICODONE) 5 MG immediate release tablet Take 1 tablet (5 mg total) by mouth every 8 (eight) hours as needed for severe pain (pain score 7-10) or breakthrough pain. 8 tablet 0    tiZANidine (ZANAFLEX) 4 MG tablet Take 4 mg by mouth 3 (three) times daily.       Patient Stressors: Financial difficulties   Traumatic event    Patient Strengths: Motivation for treatment/growth   Treatment Modalities: Medication Management, Group therapy, Case management,  1 to 1 session with clinician, Psychoeducation, Recreational therapy.   Physician Treatment Plan for Primary Diagnosis: Bipolar I disorder, current or most recent episode depressed, with psychotic features (HCC) Long Term Goal(s):     Short Term Goals:    Medication Management: Evaluate patient's response, side effects, and tolerance of medication regimen.  Therapeutic Interventions: 1 to 1 sessions, Unit Group sessions and Medication administration.  Evaluation of Outcomes: Progressing  Physician Treatment Plan for Secondary Diagnosis: Principal Problem:   Bipolar I disorder, current or most recent episode depressed, with psychotic features (HCC) Active Problems:   Suicidal ideation   Post traumatic stress disorder (PTSD)  Long Term Goal(s):     Short Term Goals:       Medication Management: Evaluate patient's response, side effects, and tolerance of medication regimen.  Therapeutic Interventions: 1 to 1 sessions, Unit Group sessions and Medication administration.  Evaluation of Outcomes: Progressing   RN Treatment Plan for Primary Diagnosis: Bipolar I disorder, current or most recent episode depressed, with psychotic features  (HCC) Long Term Goal(s): Knowledge of disease and therapeutic regimen to maintain health will improve  Short Term Goals: Ability to demonstrate self-control, Ability to participate in decision making will improve, Ability to verbalize feelings will improve, Ability to disclose and discuss suicidal ideas, Ability to identify and develop effective coping behaviors will improve, and Compliance with prescribed medications will improve  Medication Management: RN will administer medications as ordered by provider, will assess and evaluate patient's response and provide education to patient for prescribed medication. RN will report any adverse and/or side  effects to prescribing provider.  Therapeutic Interventions: 1 on 1 counseling sessions, Psychoeducation, Medication administration, Evaluate responses to treatment, Monitor vital signs and CBGs as ordered, Perform/monitor CIWA, COWS, AIMS and Fall Risk screenings as ordered, Perform wound care treatments as ordered.  Evaluation of Outcomes: Progressing   LCSW Treatment Plan for Primary Diagnosis: Bipolar I disorder, current or most recent episode depressed, with psychotic features (HCC) Long Term Goal(s): Safe transition to appropriate next level of care at discharge, Engage patient in therapeutic group addressing interpersonal concerns.  Short Term Goals: Engage patient in aftercare planning with referrals and resources, Increase social support, Increase ability to appropriately verbalize feelings, Increase emotional regulation, Facilitate acceptance of mental health diagnosis and concerns, and Increase skills for wellness and recovery  Therapeutic Interventions: Assess for all discharge needs, 1 to 1 time with Social worker, Explore available resources and support systems, Assess for adequacy in community support network, Educate family and significant other(s) on suicide prevention, Complete Psychosocial Assessment, Interpersonal group  therapy.  Evaluation of Outcomes: Progressing   Progress in Treatment: Attending groups: Yes. Participating in groups: Yes. Taking medication as prescribed: Yes. Toleration medication: Yes. Family/Significant other contact made: No, will contact:  once permission has been given, Patient understands diagnosis: Yes. Discussing patient identified problems/goals with staff: Yes. Medical problems stabilized or resolved: Yes. Denies suicidal/homicidal ideation: Yes. Issues/concerns per patient self-inventory: No. Other: none  New problem(s) identified: No, Describe:  none   New Short Term/Long Term Goal(s): detox, elimination of symptoms of psychosis, medication management for mood stabilization; elimination of SI thoughts; development of comprehensive mental wellness/sobriety plan.   Patient Goals:  "take my medicine"  Discharge Plan or Barriers: Patient reports that she will stay with her partner at discharge.  She reports plans on following up with [facility].  No barriers identified at this time.   Reason for Continuation of Hospitalization: Anxiety Depression Medication stabilization Suicidal ideation  Estimated Length of Stay:  1-7 days  Last 3 [place] Suicide Severity Risk Score: Flowsheet Row Admission (Current) from 12/29/2023 in [facility] INPATIENT BE[facility]AL MEDICINE ED from 12/28/2023 in [facility] Emergency Department at [facility] ED from 12/26/2023 in [facility] Emergency Department at [facility]  C-SSRS RISK CATEGORY No Risk No Risk No Risk       Last PHQ 2/9 Scores:     No data to display          Scribe for Treatment Team: Harden Mo, LCSW 12/31/2023 4:09 PM

## 2023-12-31 NOTE — Group Note (Signed)
Date:  12/31/2023 Time:  12:41 PM  Group Topic/Focus:  Rediscovering Joy:   The focus of this group is to explore various ways to relieve stress in a positive manner. Art group therapy    Participation Level:  Active  Participation Quality:  Appropriate and Attentive  Affect:  Appropriate  Cognitive:  Alert, Appropriate, and Oriented  Insight: Appropriate  Engagement in Group:  Developing/Improving and Engaged  Modes of Intervention:  Activity  Additional Comments:    Toyia Jelinek 12/31/2023, 12:41 PM

## 2023-12-31 NOTE — Group Note (Signed)
LCSW Group Therapy Note   Group Date: 12/31/2023 Start Time: 1400 End Time: 1505   Type of Therapy and Topic:  Group Therapy: Challenging Core Beliefs  Participation Level:  Active  Description of Group:  Patients were educated about core beliefs and asked to identify one harmful core belief that they have. Patients were asked to explore from where those beliefs originate. Patients were asked to discuss how those beliefs make them feel and the resulting behaviors of those beliefs. They were then be asked if those beliefs are true and, if so, what evidence they have to support them. Lastly, group members were challenged to replace those negative core beliefs with helpful beliefs.   Therapeutic Goals:   1. Patient will identify harmful core beliefs and explore the origins of such beliefs. 2. Patient will identify feelings and behaviors that result from those core beliefs. 3. Patient will discuss whether such beliefs are true. 4.  Patient will replace harmful core beliefs with helpful ones.  Summary of Patient Progress:  Patient actively engaged in processing and exploring how core beliefs are formed and how they impact thoughts, feelings, and behaviors. Patient proved open to input from peers and feedback from CSW. Patient demonstrated proficient  insight into the subject matter, was respectful and supportive of peers, and participated throughout the entire session.  Therapeutic Modalities: Cognitive Behavioral Therapy; Solution-Focused Therapy   Lowry Ram, LCSWA 12/31/2023  3:08 PM

## 2024-01-01 DIAGNOSIS — F1210 Cannabis abuse, uncomplicated: Secondary | ICD-10-CM | POA: Insufficient documentation

## 2024-01-01 DIAGNOSIS — F315 Bipolar disorder, current episode depressed, severe, with psychotic features: Secondary | ICD-10-CM | POA: Diagnosis not present

## 2024-01-01 LAB — LIPID PANEL
Cholesterol: 223 mg/dL — ABNORMAL HIGH (ref 0–200)
HDL: 43 mg/dL (ref >40)
LDL Cholesterol: 163 mg/dL — ABNORMAL HIGH (ref 0–99)
Total CHOL/HDL Ratio: 5.2 {ratio}
Triglycerides: 85 mg/dL (ref <150)
VLDL: 17 mg/dL (ref 0–40)

## 2024-01-01 LAB — GLUCOSE, CAPILLARY
Glucose-Capillary: 229 mg/dL — ABNORMAL HIGH (ref 70–99)
Glucose-Capillary: 370 mg/dL — ABNORMAL HIGH (ref 70–99)

## 2024-01-01 MED ORDER — HYDROXYZINE HCL 25 MG PO TABS: 25.0000 mg | ORAL_TABLET | Freq: Three times a day (TID) | ORAL | 0 refills | Status: AC | PRN

## 2024-01-01 MED ORDER — OXYCODONE HCL 5 MG PO TABS: 5.0000 mg | ORAL_TABLET | Freq: Three times a day (TID) | ORAL | 0 refills | Status: AC | PRN

## 2024-01-01 MED ORDER — NICOTINE 7 MG/24HR TD PT24: 7.0000 mg | MEDICATED_PATCH | TRANSDERMAL | 0 refills | Status: AC

## 2024-01-01 MED ORDER — SERTRALINE HCL 50 MG PO TABS: 50.0000 mg | ORAL_TABLET | Freq: Every day | ORAL | 0 refills | Status: AC

## 2024-01-01 MED ORDER — ARIPIPRAZOLE 5 MG PO TABS: 5.0000 mg | ORAL_TABLET | Freq: Every day | ORAL | 0 refills | Status: AC

## 2024-01-01 MED ORDER — ASPIRIN 325 MG PO TBEC: 325.0000 mg | DELAYED_RELEASE_TABLET | Freq: Every day | ORAL | 0 refills | Status: AC

## 2024-01-01 MED ORDER — TIZANIDINE HCL 4 MG PO TABS: 4.0000 mg | ORAL_TABLET | Freq: Three times a day (TID) | ORAL | 0 refills | Status: AC | PRN

## 2024-01-01 NOTE — Progress Notes (Signed)
   01/01/24 0837  Psych Admission Type (Psych Patients Only)  Admission Status Voluntary  Psychosocial Assessment  Patient Complaints None  Eye Contact Fair  Facial Expression Animated  Affect Anxious  Speech Soft  Interaction Assertive  Motor Activity Unsteady  Appearance/Hygiene In scrubs  Behavior Characteristics Cooperative;Appropriate to situation  Mood Pleasant  Thought Process  Coherency WDL  Content WDL  Delusions None reported or observed  Perception WDL  Hallucination None reported or observed  Judgment Poor  Confusion None  Danger to Self  Current suicidal ideation? Denies  Agreement Not to Harm Self Yes  Description of Agreement Verbal  Danger to Others  Danger to Others None reported or observed
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
## 2024-01-01 NOTE — Plan of Care (Signed)

## 2024-01-01 NOTE — Group Note (Signed)
Date:  01/01/2024 Time:  12:46 PM  Group Topic/Focus:  Goals Group:   The focus of this group is to help patients establish daily goals to achieve during treatment and discuss how the patient can incorporate goal setting into their daily lives to aide in recovery. Managing Feelings:   The focus of this group is to identify what feelings patients have difficulty handling and develop a plan to handle them in a healthier way upon discharge.    Participation Level:  Active  Participation Quality:  Appropriate and Attentive  Affect:  Appropriate  Cognitive:  Alert, Appropriate, and Oriented  Insight: Appropriate  Engagement in Group:  Developing/Improving and Engaged  Modes of Intervention:  Activity, Discussion, and Education  Additional Comments:    [name] 01/01/2024, 12:46 PM

## 2024-01-01 NOTE — Group Note (Signed)
Recreation Therapy Group Note   Group Topic:Problem Solving  Group Date: 01/01/2024 Start Time: 1000 End Time: 1050 Facilitators: Rosina Lowenstein, LRT, CTRS Location:  Craft Room  Group Description: Life Boat. Patients were given the scenario that they are on a boat that is about to become shipwrecked, leaving them stranded on an [place]. They are asked to make a list of 15 different items that they want to take with them when they are stranded on the [place]. Patients are asked to rank their items from most important to least important, #1 being the most important and #15 being the least. Patients will work individually for the first round to come up with 15 items and then pair up with a peer(s) to condense their list and come up with one list of 15 items between the two of them. Patients or LRT will read aloud the 15 different items to the group after each round. LRT facilitated post-activity processing to discuss how this activity can be used in daily life post discharge.   Goal Area(s) Addressed:  Patient will identify priorities, wants and needs. Patient will communicate with LRT and peers. Patient will work collectively as a Administrator, Civil Service. Patient will work on Product manager.    Affect/Mood: Appropriate   Participation Level: Active and Engaged   Participation Quality: Independent   Behavior: Appropriate and Cooperative   Speech/Thought Process: Coherent   Insight: Good   Judgement: Good   Modes of Intervention: Activity, Exploration, Group work, Guided Discussion, Dentist, and Team-building   Patient Response to Interventions:  Attentive, Engaged, Interested , and Receptive   Education Outcome:  Acknowledges education   Clinical Observations/Individualized Feedback: Cassandra Flynn was active in their participation of session activities and group discussion. Pt identified "water, body wash, bamboo and those big leaves" as things she will bring with her. Pt interacted  well with LRT and peers duration of session.    Plan: Continue to engage patient in RT group sessions 2-3x/week.   Rosina Lowenstein, LRT, CTRS 01/01/2024 12:52 PM

## 2024-01-01 NOTE — Progress Notes (Signed)
   01/01/24 0408  Psych Admission Type (Psych Patients Only)  Admission Status Voluntary  Psychosocial Assessment  Patient Complaints Anxiety  Eye Contact Fair  Facial Expression Animated  Affect Anxious  Speech Soft  Interaction Assertive  Motor Activity Unsteady  Appearance/Hygiene In scrubs  Behavior Characteristics Cooperative  Mood Anxious  Aggressive Behavior  Effect No apparent injury  Thought Process  Coherency WDL  Content WDL  Delusions None reported or observed  Perception WDL  Hallucination None reported or observed  Judgment Poor  Confusion None  Danger to Self  Current suicidal ideation? Denies  Agreement Not to Harm Self Yes  Description of Agreement verbal  Danger to Others  Danger to Others None reported or observed

## 2024-01-01 NOTE — Progress Notes (Signed)
Patient ID: [name], female   DOB: 01-Mar-1996, 28 y.o.   MRN: [id number]  Patient discharged and left with her grandmother. No distress noted. Denies SI/HI/AVH at discharge.

## 2024-01-01 NOTE — [facility] Suicide Risk Assessment (Cosign Needed Addendum)
[facility] Discharge Suicide Risk Assessment   Principal Problem: Bipolar I disorder, current or most recent episode depressed, with psychotic features [facility]) Discharge Diagnoses: Principal Problem:   Bipolar I disorder, current or most recent episode depressed, with psychotic features (HCC) Active Problems:   Schizoaffective disorder (HCC)   Suicidal ideation   Post traumatic stress disorder (PTSD)   Diabetes (HCC)   Total Time spent with patient: 1 hour  Musculoskeletal: Strength & Muscle Tone: within normal limits Gait & Station:  left foot with boot non weight bearing Patient leans: N/A  Psychiatric Specialty Exam  Presentation  General Appearance:  Appropriate for Environment; Fairly Groomed  Eye Contact: Good  Speech: Clear and Coherent  Speech Volume: Normal  Handedness:No data recorded  Mood and Affect  Mood: Euthymic  Duration of Depression Symptoms: Greater than two weeks  Affect: Flat; Blunt   Thought Process  Thought Processes: Coherent; Linear  Descriptions of Associations:Intact  Orientation:Full (Time, Place and Person) (and situation)  Thought Content:WDL  History of Schizophrenia/Schizoaffective disorder:Yes  Duration of Psychotic Symptoms:Less than six months  Hallucinations:Hallucinations: None Description of Auditory Hallucinations: denies  Ideas of Reference:None  Suicidal Thoughts:Suicidal Thoughts: No  Homicidal Thoughts:Homicidal Thoughts: No   Sensorium  Memory: Immediate Good; Remote Good  Judgment: Fair  Insight: Fair   Art therapist  Concentration: Fair  Attention Span: Good  Recall: Good  Fund of Knowledge: Good  Language: Good   Psychomotor Activity  Psychomotor Activity: Psychomotor Activity: Normal   Assets  Assets: Communication Skills; Desire for Improvement; Housing; Resilience   Sleep  Sleep: Sleep: Good Number of Hours of Sleep: 6   Physical Exam: Physical  Exam Vitals and nursing note reviewed.  Constitutional:      Appearance: Normal appearance.  HENT:     Head: Normocephalic and atraumatic.     Nose: Nose normal.  Pulmonary:     Effort: Pulmonary effort is normal.  Musculoskeletal:        General: Signs of injury present.     Cervical back: Normal range of motion.     Left lower leg: Edema present.     Comments: Walking boot non weight bearing  Neurological:     General: No focal deficit present.     Mental Status: She is alert and oriented to person, place, and time. Mental status is at baseline.  Psychiatric:        Attention and Perception: Attention and perception normal.        Mood and Affect: Mood and affect normal.        Speech: Speech normal.        Behavior: Behavior normal. Behavior is cooperative.        Thought Content: Thought content normal.        Cognition and Memory: Cognition and memory normal.    Review of Systems  Musculoskeletal:        Left foot with a boot   Blood pressure 130/73, pulse 83, temperature 98.7 F (37.1 C), temperature source Oral, resp. rate 18, height 5\' 11"  (1.803 m), weight (!) 161 kg, last menstrual period 12/09/2023, SpO2 98%. Body mass index is 49.51 kg/m.  Mental Status Per Nursing Assessment::   On Admission:  NA  Demographic Factors:  Adolescent or young adult, Low socioeconomic status, and Unemployed  Loss Factors: Financial problems/change in socioeconomic status  Historical Factors: Family history of mental illness or substance abuse and Impulsivity  Risk Reduction Factors:   Responsible for children under 18 years of  age, Living with another person, especially a relative, and Positive therapeutic relationship  Continued Clinical Symptoms:  Bipolar Disorder:   Depressive phase Alcohol/Substance Abuse/Dependencies Schizophrenia:   Less than 24 years old Medical Diagnoses and Treatments/Surgeries  Cognitive Features That Contribute To Risk:  None    Suicide  Risk:  Minimal: No identifiable suicidal ideation.  Patients presenting with no risk factors but with morbid ruminations; may be classified as minimal risk based on the severity of the depressive symptoms   Follow-up Information     [facility]. Go to.   Why: In person appointment 01/09/24 at 11 AM.   Please feel free to contact [facility] peer support speciailst Unk Pinto at [phone]. Contact information: [address] [place] [address] [phone]                 Plan Of Care/Follow-up recommendations:  Activity:  as tolerated Diet:  heart healthy Abilify 5mg  by mouth daily for schizoaffective disorder and trichotillomania. Sertraline 50mg  by mouth daily for PTSD. Hydroxyzine 25mg  by mouth three times daily as needed for anxiety. Nicotine Patch 21mg /24 hours for smoking cessation. Novolog 15 units by mouth three times daily with meals for diabetes management. Oxycodone 5mg  by mouth every 8 hours as needed for severe pain.for 5 days Tizanidine 4mg  by mouth three times daily for pain. National Suicide Prevention Lifeline: 988 (available 24/7). Crisis Text Line: Text HOME to (289) [phone] (available 24/7). Schedule an appointment with outpatient psychiatry for continued medication management and monitoring within 1 week. Schedule a follow-up with the primary care provider (PCP) for diabetes and pain management within 1 week. Attend physical therapy for ankle fracture care. Begin outpatient therapy for ongoing emotional support, coping strategies, and housing assistance Myriam Forehand, NP e. 01/01/2024, 11:37 AM

## 2024-01-01 NOTE — Plan of Care (Signed)
  Problem: Clinical Measurements: Goal: Will remain free from infection Outcome: Progressing Goal: Diagnostic test results will improve Outcome: Progressing   Problem: Health Behavior/Discharge Planning: Goal: Ability to manage health-related needs will improve Outcome: Progressing   Problem: Education: Goal: Knowledge of General Education information will improve Description: Including pain rating scale, medication(s)/side effects and non-pharmacologic comfort measures Outcome: Progressing

## 2024-06-02 ENCOUNTER — Other Ambulatory Visit: Payer: Self-pay

## 2024-06-02 ENCOUNTER — Emergency Department
Admission: EM | Admit: 2024-06-02 | Discharge: 2024-06-03 | Disposition: A | Discharge status: Other Acute Inpatient Hospital | Payer: MEDICAID | Attending: Emergency Medicine | Admitting: Emergency Medicine

## 2024-06-02 DIAGNOSIS — E0810 Diabetes mellitus due to underlying condition with ketoacidosis without coma: Secondary | ICD-10-CM | POA: Diagnosis not present

## 2024-06-02 DIAGNOSIS — F17210 Nicotine dependence, cigarettes, uncomplicated: Secondary | ICD-10-CM | POA: Insufficient documentation

## 2024-06-02 DIAGNOSIS — Z7984 Long term (current) use of oral hypoglycemic drugs: Secondary | ICD-10-CM | POA: Diagnosis not present

## 2024-06-02 DIAGNOSIS — R45851 Suicidal ideations: Secondary | ICD-10-CM | POA: Diagnosis not present

## 2024-06-02 DIAGNOSIS — Z794 Long term (current) use of insulin: Secondary | ICD-10-CM | POA: Diagnosis not present

## 2024-06-02 DIAGNOSIS — J45909 Unspecified asthma, uncomplicated: Secondary | ICD-10-CM | POA: Insufficient documentation

## 2024-06-02 DIAGNOSIS — F250 Schizoaffective disorder, bipolar type: Secondary | ICD-10-CM

## 2024-06-02 DIAGNOSIS — I10 Essential (primary) hypertension: Secondary | ICD-10-CM | POA: Insufficient documentation

## 2024-06-02 LAB — CBC
HCT: 41 % (ref 36.0–46.0)
Hemoglobin: 13.7 g/dL (ref 12.0–15.0)
MCH: 29.4 pg (ref 26.0–34.0)
MCHC: 33.4 g/dL (ref 30.0–36.0)
MCV: 88 fL (ref 80.0–100.0)
Platelets: 242 10*3/uL (ref 150–400)
RBC: 4.66 MIL/uL (ref 3.87–5.11)
RDW: 12.3 % (ref 11.5–15.5)
WBC: 7 10*3/uL (ref 4.0–10.5)
nRBC: 0 % (ref 0.0–0.2)

## 2024-06-02 LAB — URINE DRUG SCREEN, QUALITATIVE (ARMC ONLY)
Amphetamines, Ur Screen: NOT DETECTED
Barbiturates, Ur Screen: NOT DETECTED
Benzodiazepine, Ur Scrn: NOT DETECTED
Cannabinoid 50 Ng, Ur ~~LOC~~: POSITIVE — AB
Cocaine Metabolite,Ur ~~LOC~~: NOT DETECTED
MDMA (Ecstasy)Ur Screen: NOT DETECTED
Methadone Scn, Ur: NOT DETECTED
Opiate, Ur Screen: NOT DETECTED
Phencyclidine (PCP) Ur S: NOT DETECTED
Tricyclic, Ur Screen: NOT DETECTED

## 2024-06-02 LAB — COMPREHENSIVE METABOLIC PANEL WITH GFR
ALT: 14 U/L (ref 0–44)
AST: 15 U/L (ref 15–41)
Albumin: 3.7 g/dL (ref 3.5–5.0)
Alkaline Phosphatase: 89 U/L (ref 38–126)
Anion gap: 9 (ref 5–15)
BUN: 14 mg/dL (ref 6–20)
CO2: 24 mmol/L (ref 22–32)
Calcium: 8.9 mg/dL (ref 8.9–10.3)
Chloride: 103 mmol/L (ref 98–111)
Creatinine, Ser: 0.71 mg/dL (ref 0.44–1.00)
GFR, Estimated: >60 mL/min (ref >60)
Glucose, Bld: 200 mg/dL — ABNORMAL HIGH (ref 70–99)
Potassium: 3.9 mmol/L (ref 3.5–5.1)
Sodium: 136 mmol/L (ref 135–145)
Total Bilirubin: 0.8 mg/dL (ref 0.0–1.2)
Total Protein: 7.3 g/dL (ref 6.5–8.1)

## 2024-06-02 LAB — CBG MONITORING, ED
Glucose-Capillary: 208 mg/dL — ABNORMAL HIGH (ref 70–99)
Glucose-Capillary: 153 mg/dL — ABNORMAL HIGH (ref 70–99)
Glucose-Capillary: 193 mg/dL — ABNORMAL HIGH (ref 70–99)

## 2024-06-02 LAB — ETHANOL: Alcohol, Ethyl (B): <15 mg/dL (ref <15)

## 2024-06-02 LAB — POC URINE PREG, ED: Preg Test, Ur: NEGATIVE

## 2024-06-02 MED ORDER — INSULIN ASPART 100 UNIT/ML IJ SOLN
15.0000 [IU] | Freq: Three times a day (TID) | INTRAMUSCULAR | Status: DC
  Filled 2024-06-02: qty 0.15

## 2024-06-02 MED ORDER — INSULIN GLARGINE-YFGN 100 UNIT/ML ~~LOC~~ SOLN
55.0000 [IU] | Freq: Every day | SUBCUTANEOUS | Status: DC
  Administered 2024-06-02: 55 [IU] via SUBCUTANEOUS
  Filled 2024-06-02: qty 0.55

## 2024-06-02 MED ORDER — INSULIN ASPART 100 UNIT/ML IJ SOLN: 0.0000 [IU] | Freq: Every day | INTRAMUSCULAR | Status: DC

## 2024-06-02 MED ORDER — METFORMIN HCL ER 500 MG PO TB24
1000.0000 mg | ORAL_TABLET | Freq: Two times a day (BID) | ORAL | Status: DC
  Administered 2024-06-02 – 2024-06-03 (×2): 1000 mg via ORAL
  Filled 2024-06-02 (×2): qty 2

## 2024-06-02 MED ORDER — INSULIN ASPART 100 UNIT/ML IJ SOLN
0.0000 [IU] | Freq: Three times a day (TID) | INTRAMUSCULAR | Status: DC
  Administered 2024-06-02: 3 [IU] via SUBCUTANEOUS
  Administered 2024-06-02: 5 [IU] via SUBCUTANEOUS
  Administered 2024-06-03: 2 [IU] via SUBCUTANEOUS
  Filled 2024-06-02 (×3): qty 1

## 2024-06-02 MED ORDER — INSULIN ASPART 100 UNIT/ML IJ SOLN
6.0000 [IU] | Freq: Three times a day (TID) | INTRAMUSCULAR | Status: DC
  Administered 2024-06-02 – 2024-06-03 (×3): 6 [IU] via SUBCUTANEOUS
  Filled 2024-06-02 (×2): qty 1

## 2024-06-02 NOTE — ED Notes (Signed)
 Telepsych monitor placed at bedside for pt evaluation. Pt calm and cooperative at this time.

## 2024-06-02 NOTE — ED Notes (Signed)
 Served pt snacks with water

## 2024-06-02 NOTE — ED Provider Notes (Signed)
 [facility] Provider Note    Event Date/Time   First MD Initiated Contact with Patient 06/02/24 0940     (approximate)   History   Chief Complaint Suicidal   HPI  Cassandra Flynn is a 28 y.o. female with past medical history of hypertension, diabetes, asthma, schizoaffective disorder, and bipolar disorder who presents to the ED complaining of suicidal ideation.  Patient reports that she has been having thoughts of harming herself for the past 2 days, but denies any specific plan.  She reports that she has been off of her medications for the past 2 months and is currently homeless, denies alcohol or drug use.  She denies any medical complaints.     Physical Exam   Triage Vital Signs: ED Triage Vitals  Encounter Vitals Group     BP 06/02/24 0933 130/88     Girls Systolic BP Percentile --      Girls Diastolic BP Percentile --      Boys Systolic BP Percentile --      Boys Diastolic BP Percentile --      Pulse Rate 06/02/24 0933 82     Resp 06/02/24 0933 19     Temp 06/02/24 0927 98.2 F (36.8 C)     Temp Source 06/02/24 0927 Oral     SpO2 06/02/24 0933 100 %     Weight 06/02/24 0925 250 lb (113.4 kg)     Height 06/02/24 0925 5' 11 (1.803 m)     Head Circumference --      Peak Flow --      Pain Score 06/02/24 0925 0     Pain Loc --      Pain Education --      Exclude from Growth Chart --     Most recent vital signs: Vitals:   06/02/24 0927 06/02/24 0933  BP:  130/88  Pulse:  82  Resp:  19  Temp: 98.2 F (36.8 C)   SpO2:  100%    Constitutional: Alert and oriented. Eyes: Conjunctivae are normal. Head: Atraumatic. Nose: No congestion/rhinnorhea. Mouth/Throat: Mucous membranes are moist.  Cardiovascular: Normal rate, regular rhythm. Grossly normal heart sounds.  2+ radial pulses bilaterally. Respiratory: Normal respiratory effort.  No retractions. Lungs CTAB. Gastrointestinal: Soft and nontender. No distention. Musculoskeletal:  No lower extremity tenderness nor edema.  Neurologic:  Normal speech and language. No gross focal neurologic deficits are appreciated.    ED Results / Procedures / Treatments   Labs (all labs ordered are listed, but only abnormal results are displayed) Labs Reviewed  COMPREHENSIVE METABOLIC PANEL WITH GFR - Abnormal; Notable for the following components:      Result Value   Glucose, Bld 200 (*)    All other components within normal limits  ETHANOL  CBC  URINE DRUG SCREEN, QUALITATIVE ([facility] ONLY)  POC URINE PREG, ED    PROCEDURES:  Critical Care performed: No  Procedures   MEDICATIONS ORDERED IN ED: Medications  insulin  glargine-yfgn (SEMGLEE ) injection 55 Units (has no administration in time range)  metFORMIN  (GLUCOPHAGE -XR) 24 hr tablet 1,000 mg (has no administration in time range)  insulin  aspart (novoLOG ) injection 0-15 Units (has no administration in time range)  insulin  aspart (novoLOG ) injection 0-5 Units (has no administration in time range)  insulin  aspart (novoLOG ) injection 6 Units (has no administration in time range)     IMPRESSION / MDM / ASSESSMENT AND PLAN / ED COURSE  I reviewed the triage vital signs and the  nursing notes.                              28 y.o. female with past medical history of hypertension, diabetes, asthma, schizoaffective disorder, and bipolar disorder who presents to the ED with 2 days of suicidal ideation without plan.  Patient's presentation is most consistent with acute presentation with potential threat to life or bodily function.  Differential diagnosis includes, but is not limited to, depression, anxiety, psychosis, suicidal ideation, anemia, electrolyte abnormality, AKI, substance abuse, medication noncompliance.  Patient nontoxic-appearing and in no acute distress, vital signs are unremarkable.  She denies any medical complaints and screening labs are pending at this time.  Psychiatry and TTS were consulted.  She is calm  and cooperative, agreeable to admission after required and so we will maintain voluntary status.  Labs with mild hyperglycemia but no significant anemia, electrolyte abnormality, or AKI.  LFTs are unremarkable, ethanol level undetectable.  Patient may be medically cleared for psychiatric admission, evaluation pending at this time.  Her home insulin  regimen was restarted.  The patient has been placed in psychiatric observation due to the need to provide a safe environment for the patient while obtaining psychiatric consultation and evaluation, as well as ongoing medical and medication management to treat the patient's condition.  The patient has not been placed under full IVC at this time.       FINAL CLINICAL IMPRESSION(S) / ED DIAGNOSES   Final diagnoses:  Suicidal ideation     Rx / DC Orders   ED Discharge Orders     None        Note:  This document was prepared using Dragon voice recognition software and may include unintentional dictation errors.   Willo Dunnings, MD 06/02/24 1114

## 2024-06-02 NOTE — ED Notes (Signed)
 Pt was accepted to [facility] for tomorrow after 8am.

## 2024-06-02 NOTE — ED Notes (Addendum)
 Pt Given Breakfast

## 2024-06-02 NOTE — Progress Notes (Addendum)
 [facility] made 3 unsuccessful calls to pt's grandmother Jacolyn Phlegm- [phone]) to advise of acceptance to [facility].  Receiving message: Service is restricted or unavailable.   Patient has been accepted to [facility] for 06/03/24 after 8am  Patient was not assigned to room. Accepting physician is Dr. Millie Manners Call report to [phone] Opt 2 (Leave vmail) Representative was [facility]   ER Staff is aware of it: Olam, ER Secretary Dr. Willo, ER MD Katheren, RN Patient's Nurse   Michial Skeen, [facility] [phone]

## 2024-06-02 NOTE — ED Notes (Signed)
Pt watching TV

## 2024-06-02 NOTE — ED Notes (Signed)
 Pt provided apple juice. Pt is pleasant, calm and cooperative.

## 2024-06-02 NOTE — ED Notes (Signed)
Phone # given to pt

## 2024-06-02 NOTE — ED Notes (Signed)
 Dinner tray provided

## 2024-06-02 NOTE — Progress Notes (Signed)
 Dr. Kodjo recommended patient for inpatient psychiatric admission. Per [facility], there are no beds available. Patient was referred to the following facilities:   Service Provider Phone  [facility]  [phone]  [facility]  [phone]  [facility]  [phone]  [facility]  [phone]  [facility]  [phone]  [facility] [phone]  [facility]  [phone]  [facility]  [phone]  [facility]  [phone]  [facility]  [phone], [place] [phone]

## 2024-06-02 NOTE — ED Notes (Signed)
 Evaluation complete. Pt prepared to move to [facility].

## 2024-06-02 NOTE — Consult Note (Signed)
 [facility] Consult Note  Patient Name: Cassandra Flynn MRN: [id number] DOB: [birth year] DATE OF Consult: 06/02/2024  PRIMARY PSYCHIATRIC DIAGNOSES  1.  Schizoaffective disorder bipolar type  RECOMMENDATIONS  Recommendations: Medication recommendations: Resume Abilify  10 mg p.o. nightly for psychosis/mood stabilization, Zoloft  50 mg p.o. daily for depression Non-Medication/therapeutic recommendations: Supportive care, suicide precautions Is inpatient psychiatric hospitalization recommended for this patient? Yes (Explain why): Commanding suicidal auditory hallucinations/high risk of suicide Is another care setting recommended for this patient? (examples may include Crisis Stabilization Unit, Residential/Recovery Treatment, ALF/SNF, Memory Care Unit)  No (Explain why): Commanding suicidal auditory hallucinations/high risk of suicide From a psychiatric perspective, is this patient appropriate for discharge to an outpatient setting/resource or other less restrictive environment for continued care?  No (Explain why): Commanding suicidal auditory hallucinations/high risk of suicide Follow-Up Telepsychiatry C/L services: We will sign off for now. Please re-consult our service if needed for any concerning changes in the patient's condition, discharge planning, or questions. Communication: Treatment team members (and family members if applicable) who were involved in treatment/care discussions and planning, and with whom we spoke or engaged with via secure text/chat, include the following: Armida Alfonso Doffing, RN  Thank you for involving us  in the care of this patient. If you have any additional questions or concerns, please call [phone] and ask for me or the provider on-call.  TELEPSYCHIATRY ATTESTATION & CONSENT  As the provider for this telehealth consult, I attest that I verified the patient's identity using two separate identifiers, introduced myself to the patient, provided my  credentials, disclosed my location, and performed this encounter via a HIPAA-compliant, real-time, face-to-face, two-way, interactive audio and video platform and with the full consent and agreement of the patient (or guardian as applicable.)  Patient physical location: [place]. Telehealth provider physical location: home office in state of [place].  Video start time: 1220 PM (Central Time) Video end time: 1240 PM (Central Time)  IDENTIFYING DATA  Cassandra Flynn is a 28 y.o. year-old female for whom a psychiatric consultation has been ordered by the primary provider. The patient was identified using two separate identifiers.  CHIEF COMPLAINT/REASON FOR CONSULT  Commanding suicidal auditory hallucinations  HISTORY OF PRESENT ILLNESS (HPI)  Psych consult requested for evaluation of 28 year old female with history of schizoaffective disorder bipolar type noncompliant with medication for the past few weeks, presented to the ED today with complaint of commanding suicidal auditory hallucination, denies plan or intent.    During evaluation patient is alert oriented calm cooperative and coherent.  Reports she ran out of her Abilify  and Zoloft  and did not take them for the past few weeks.  Other stressors are related to housing, currently homeless, finances, lost her job [employer] after she hurt her ankle.  Endorses voices telling her to kill herself.  Denies starting wanting to kill herself.  Admits to 1 suicide attempt 8 years ago via overdose on medications, and two inpatient psych admissions, last admission was seven months ago.  Patient is requesting inpatient psych stabilization.  PAST PSYCHIATRIC HISTORY   Otherwise as per HPI above.  PAST MEDICAL HISTORY  Past Medical History:  Diagnosis Date   Asthma    Diabetes mellitus without complication (HCC)    Diabetic ketoacidosis (HCC) 06/22/2019   Hypertension    Sleep apnea      HOME MEDICATIONS  Facility Ordered Medications  Medication    insulin  glargine-yfgn (SEMGLEE ) injection 55 Units   metFORMIN  (GLUCOPHAGE -XR) 24 hr tablet 1,000 mg   insulin  aspart (novoLOG )  injection 0-15 Units   insulin  aspart (novoLOG ) injection 0-5 Units   insulin  aspart (novoLOG ) injection 6 Units   PTA Medications  Medication Sig   insulin  glargine (LANTUS  SOLOSTAR) 100 UNIT/ML Solostar Pen Inject 55 Units into the skin daily.   Insulin  Pen Needle 32G X 4 MM MISC Use to inject insulin  4 times daily as directed   insulin  aspart (NOVOLOG ) 100 UNIT/ML FlexPen Inject 15 Units into the skin 3 (three) times daily with meals. 15 units with meals with additional sliding scale dose based on blood sugar (0-15 units) as below - CBG 70 - 120: 0 units, CBG 121 - 150: 2 units, CBG 151 - 200: 3 units, CBG 201 - 250: 5 units, CBG 251 - 300: 8 units, CBG 301 - 350: 11 units, CBG 351 - 400: 15 units, CBG <70 or > 400 call MD   metFORMIN  (GLUCOPHAGE -XR) 500 MG 24 hr tablet Take 1,000 mg by mouth 2 (two) times daily.   ARIPiprazole  (ABILIFY ) 5 MG tablet Take 1 tablet (5 mg total) by mouth daily.   sertraline  (ZOLOFT ) 50 MG tablet Take 1 tablet (50 mg total) by mouth daily.   nicotine  (NICODERM CQ  - DOSED IN MG/24 HR) 7 mg/24hr patch Place 1 patch (7 mg total) onto the skin daily.     ALLERGIES  Allergies  Allergen Reactions   Penicillins Itching, Swelling and Rash    Has patient had a PCN reaction causing immediate rash, facial/tongue/throat swelling, SOB or lightheadedness with hypotension: Unknown Has patient had a PCN reaction causing severe rash involving mucus membranes or skin necrosis: Unknown Has patient had a PCN reaction that required hospitalization: Unknown Has patient had a PCN reaction occurring within the last 10 years: Unknown If all of the above answers are NO, then may proceed with Cephalosporin use.     SOCIAL & SUBSTANCE USE HISTORY  Social History   Socioeconomic History   Marital status: Single    Spouse name: Not on file   Number of  children: Not on file   Years of education: Not on file   Highest education level: Not on file  Occupational History   Not on file  Tobacco Use   Smoking status: Every Day    Types: Cigarettes   Smokeless tobacco: Never  Vaping Use   Vaping status: Never Used  Substance and Sexual Activity   Alcohol use: No   Drug use: No   Sexual activity: Never    Birth control/protection: Injection  Other Topics Concern   Not on file  Social History Narrative   Not on file   Social Drivers of Health   Financial Resource Strain: Not on file  Food Insecurity: No Food Insecurity (12/29/2023)   Hunger Vital Sign    Worried About Running Out of Food in the Last Year: Never true    Ran Out of Food in the Last Year: Never true  Transportation Needs: No Transportation Needs (12/29/2023)   PRAPARE - Administrator, Civil Service (Medical): No    Lack of Transportation (Non-Medical): No  Physical Activity: Not on file  Stress: Not on file  Social Connections: Not on file   Social History   Tobacco Use  Smoking Status Every Day   Types: Cigarettes  Smokeless Tobacco Never   Social History   Substance and Sexual Activity  Alcohol Use No   Social History   Substance and Sexual Activity  Drug Use No    Additional pertinent information .  FAMILY HISTORY  History reviewed. No pertinent family history. Family Psychiatric History (if known):    MENTAL STATUS EXAM (MSE)  Mental Status Exam: General Appearance: Well Groomed  Orientation:  Full (Time, Place, and Person)  Memory:  Immediate;   Fair Recent;   Fair Remote;   Fair  Concentration:  Concentration: Fair and Attention Span: Fair  Recall:  Fair  Attention  Fair  Eye Contact:  Good  Speech:  Clear and Coherent  Language:  Fair  Volume:  Normal  Mood: not good  Affect:  Restricted  Thought Process:  Coherent  Thought Content:  Hallucinations: Auditory  Suicidal Thoughts:  Yes.  without intent/plan  Homicidal  Thoughts:  No  Judgement:  Other:  limited  Insight:  Fair  Psychomotor Activity:  Normal  Akathisia:  No  Fund of Knowledge:  Fair    Assets:  Communication Skills Desire for Improvement Physical Health  Cognition:  WNL  ADL's:  Intact  AIMS (if indicated):       VITALS  Blood pressure 130/88, pulse 82, temperature 98.2 F (36.8 C), temperature source Oral, resp. rate 19, height 5' 11 (1.803 m), weight 113.4 kg, last menstrual period 05/01/2024, SpO2 100%.  LABS  Admission on 06/02/2024  Component Date Value Ref Range Status   Sodium 06/02/2024 136  135 - 145 mmol/L Final   Potassium 06/02/2024 3.9  3.5 - 5.1 mmol/L Final   Chloride 06/02/2024 103  98 - 111 mmol/L Final   CO2 06/02/2024 24  22 - 32 mmol/L Final   Glucose, Bld 06/02/2024 200 (H)  70 - 99 mg/dL Final   Glucose reference range applies only to samples taken after fasting for at least 8 hours.   BUN 06/02/2024 14  6 - 20 mg/dL Final   Creatinine, Ser 06/02/2024 0.71  0.44 - 1.00 mg/dL Final   Calcium  06/02/2024 8.9  8.9 - 10.3 mg/dL Final   Total Protein 93/76/7974 7.3  6.5 - 8.1 g/dL Final   Albumin 93/76/7974 3.7  3.5 - 5.0 g/dL Final   AST 93/76/7974 15  15 - 41 U/L Final   ALT 06/02/2024 14  0 - 44 U/L Final   Alkaline Phosphatase 06/02/2024 89  38 - 126 U/L Final   Total Bilirubin 06/02/2024 0.8  0.0 - 1.2 mg/dL Final   GFR, Estimated 06/02/2024 >60  >60 mL/min Final   Comment: (NOTE) Calculated using the CKD-EPI Creatinine Equation (2021)    Anion gap 06/02/2024 9  5 - 15 Final   Performed at [facility], [address] [place], [place] [address]   Alcohol, Ethyl (B) 06/02/2024 <15  <15 mg/dL Final   Comment: (NOTE) For medical purposes only. Performed at [facility], [address] [place], [place] [address]    WBC 06/02/2024 7.0  4.0 - 10.5 K/uL Final   RBC 06/02/2024 4.66  3.87 - 5.11 MIL/uL Final   Hemoglobin 06/02/2024 13.7  12.0 - 15.0 g/dL Final   HCT 93/76/7974 41.0   36.0 - 46.0 % Final   MCV 06/02/2024 88.0  80.0 - 100.0 fL Final   MCH 06/02/2024 29.4  26.0 - 34.0 pg Final   MCHC 06/02/2024 33.4  30.0 - 36.0 g/dL Final   RDW 93/76/7974 12.3  11.5 - 15.5 % Final   Platelets 06/02/2024 242  150 - 400 K/uL Final   nRBC 06/02/2024 0.0  0.0 - 0.2 % Final   Performed at [facility], [address] [place],  [place] [address]    PSYCHIATRIC REVIEW OF SYSTEMS (ROS)  ROS: Notable for the following relevant positive findings: ROS  Additional findings:      Musculoskeletal: No abnormal movements observed      Gait & Station: Normal      Pain Screening: Denies      Nutrition & Dental Concerns: no  RISK FORMULATION/ASSESSMENT  Is the patient experiencing any suicidal or homicidal ideations: Yes       Explain if yes: Commanding auditory suicidal hallucination Protective factors considered for safety management: In hospital, constant observation  Risk factors/concerns considered for safety management:  Prior attempt Depression Substance abuse/dependence Unmarried  Is there a safety management plan with the patient and treatment team to minimize risk factors and promote protective factors: Yes           Explain: Med management, inpatient psychiatric stabilization Is crisis care placement or psychiatric hospitalization recommended: Yes     Based on my current evaluation and risk assessment, patient is determined at this time to be at:  Moderate Risk  *RISK ASSESSMENT Risk assessment is a dynamic process; it is possible that this patient's condition, and risk level, may change. This should be re-evaluated and managed over time as appropriate. Please re-consult psychiatric consult services if additional assistance is needed in terms of risk assessment and management. If your team decides to discharge this patient, please advise the patient how to best access emergency psychiatric services, or to call 911, if their condition worsens or they feel  unsafe in any way.   Eryck Negron D Duvid Smalls, MD Telepsychiatry Consult Services

## 2024-06-02 NOTE — ED Notes (Signed)

## 2024-06-02 NOTE — ED Notes (Signed)
 Served pt dinner with apple juice

## 2024-06-02 NOTE — ED Triage Notes (Signed)
 Pt states she has had some new stressors in life and pt states SI at this time with no specific plan at this time. Pt endorses HX of bi-polar and schizophrenia and pt states she has not taken her med sin 2 months. Pt calm and cooperative in triage at this time. Pt states she has been IP psych before. NAD noted.

## 2024-06-02 NOTE — ED Notes (Addendum)
 Dressed out with EDT techs into hospital scrubs Elnor shirt  [employer] pants  [employer]

## 2024-06-03 LAB — CBG MONITORING, ED: Glucose-Capillary: 134 mg/dL — ABNORMAL HIGH (ref 70–99)

## 2024-06-03 NOTE — ED Notes (Signed)
 Safe transport called for [facility]. Unknown eta

## 2024-06-03 NOTE — ED Notes (Signed)
 Hospital meal provided, pt tolerated w/o complaints.  Waste discarded appropriately.

## 2024-07-07 ENCOUNTER — Ambulatory Visit (HOSPITAL_BASED_OUTPATIENT_CLINIC_OR_DEPARTMENT_OTHER)
Admission: EM | Admit: 2024-07-07 | Discharge: 2024-07-07 | Disposition: A | Discharge status: Home / Self Care | Payer: MEDICAID | Attending: Internal Medicine | Admitting: Internal Medicine

## 2024-07-07 ENCOUNTER — Encounter (HOSPITAL_BASED_OUTPATIENT_CLINIC_OR_DEPARTMENT_OTHER): Payer: Self-pay | Admitting: Emergency Medicine

## 2024-07-07 DIAGNOSIS — Z113 Encounter for screening for infections with a predominantly sexual mode of transmission: Secondary | ICD-10-CM | POA: Diagnosis present

## 2024-07-07 DIAGNOSIS — Z202 Contact with and (suspected) exposure to infections with a predominantly sexual mode of transmission: Secondary | ICD-10-CM | POA: Insufficient documentation

## 2024-07-07 DIAGNOSIS — Z9189 Other specified personal risk factors, not elsewhere classified: Secondary | ICD-10-CM

## 2024-07-07 MED ORDER — METRONIDAZOLE 500 MG PO TABS: 500.0000 mg | ORAL_TABLET | Freq: Two times a day (BID) | ORAL | 0 refills | Status: AC

## 2024-07-07 NOTE — Discharge Instructions (Signed)
 Take flagyl  as prescribed. No sex for 7 days while taking flagyl .  No alcohol while taking flagyl .  If you develop any new or worsening symptoms or if your symptoms do not start to improve, please return here or follow-up with your primary care provider. If your symptoms are severe, please go to the emergency room.

## 2024-07-07 NOTE — ED Provider Notes (Signed)
 [name]    CSN: [id number] Arrival date & time: 07/07/24  1816      History   Chief Complaint Chief Complaint  Patient presents with   Exposure to STD    HPI Cassandra Flynn is a 28 y.o. female.   Cassandra Flynn is a 28 y.o. female presenting for chief complaint of Exposure to STD. Her boyfriend recently tested positive for trichomonas and is receiving treatment.  Patient is currently without any symptoms and denies urinary symptoms, vaginal symptoms, fever, chills, abdominal pain, and chance of pregnancy.  Last menstrual cycle was June 10, 2024.  Denies recent antibiotic or steroid use.  She is sexually active with her boyfriend without protection.  Denies other recent new sexual partners or known exposures to STDs.   Exposure to STD    Past Medical History:  Diagnosis Date   Asthma    Diabetes mellitus without complication (HCC)    Diabetic ketoacidosis (HCC) 06/22/2019   Hypertension    Sleep apnea     Patient Active Problem List   Diagnosis Date Noted   Cannabis abuse 01/01/2024   Schizoaffective disorder (HCC) 12/29/2023   Syncope 10/28/2023   Acute encephalopathy 10/27/2023   Uncontrolled type 2 diabetes mellitus with hyperglycemia, with long-term current use of insulin  (HCC) 10/27/2023   Hyponatremia 10/27/2023   Diabetes (HCC) 10/22/2023   OSA (obstructive sleep apnea) 10/22/2023   Fracture of left tibia and fibula, closed, initial encounter 10/21/2023   Pneumonia due to COVID-19 virus 04/03/2021   CVA (cerebral vascular accident) (HCC) 04/02/2021   COVID-19 virus infection 04/02/2021   Gastroenteritis due to COVID-19 virus 04/02/2021   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   Conversion disorder 06/22/2019   Morbid obesity (HCC) 03/24/2017   Bipolar disorder (HCC) 06/16/2014   Suicidal ideation 06/16/2014   Post traumatic stress disorder (PTSD) 06/16/2014   Severe bipolar I disorder, most recent episode mixed, with psychotic features (HCC)  06/16/2014   Oppositional defiant disorder 06/16/2014   Depression 06/15/2014    Past Surgical History:  Procedure Laterality Date   CESAREAN SECTION     CHOLECYSTECTOMY     ORIF FIBULA FRACTURE  10/22/2023   Procedure: OPEN REDUCTION INTERNAL FIXATION (ORIF) FIBULA FRACTURE;  Surgeon: Maryrose Cordella Charleston, MD;  Location: [facility] ORS;  Service: Orthopedics;;   SKIN GRAFT     TIBIA IM NAIL INSERTION Left 10/22/2023   Procedure: INTRAMEDULLARY (IM) NAIL TIBIAL;  Surgeon: Maryrose Cordella Charleston, MD;  Location: [facility] ORS;  Service: Orthopedics;  Laterality: Left;   TONSILLECTOMY      OB History     Gravida  2   Para  2   Term  2   Preterm      AB      Living  2      SAB      IAB      Ectopic      Multiple      Live Births  2            Home Medications    Prior to Admission medications   Medication Sig Start Date End Date Taking? Authorizing Provider  metroNIDAZOLE  (FLAGYL ) 500 MG tablet Take 1 tablet (500 mg total) by mouth 2 (two) times daily. 07/07/24  Yes Enedelia Dorna HERO, FNP  QUEtiapine  (SEROQUEL ) 300 MG tablet Take by mouth. 06/11/24  Yes [provider]  sertraline  (ZOLOFT ) 50 MG tablet Take 1 tablet (50 mg total) by mouth daily. 01/02/24 07/07/24 Yes Nicholaus Brad RAMAN,  NP  ARIPiprazole  (ABILIFY ) 5 MG tablet Take 1 tablet (5 mg total) by mouth daily. 01/02/24 02/01/24  Nicholaus Brad RAMAN, NP  insulin  aspart (NOVOLOG ) 100 UNIT/ML FlexPen Inject 15 Units into the skin 3 (three) times daily with meals. 15 units with meals with additional sliding scale dose based on blood sugar (0-15 units) as below - CBG 70 - 120: 0 units, CBG 121 - 150: 2 units, CBG 151 - 200: 3 units, CBG 201 - 250: 5 units, CBG 251 - 300: 8 units, CBG 301 - 350: 11 units, CBG 351 - 400: 15 units, CBG <70 or > 400 call MD 10/25/23   Darci Pore, MD  insulin  glargine (LANTUS  SOLOSTAR) 100 UNIT/ML Solostar Pen Inject 55 Units into the skin daily. 10/25/23 02/22/24  Darci Pore, MD  Insulin  Pen Needle 32G X 4 MM MISC Use to inject insulin  4 times daily as directed 10/25/23   Darci Pore, MD  metFORMIN  (GLUCOPHAGE -XR) 500 MG 24 hr tablet Take 1,000 mg by mouth 2 (two) times daily. 10/31/23   [provider]  nicotine  (NICODERM CQ  - DOSED IN MG/24 HR) 7 mg/24hr patch Place 1 patch (7 mg total) onto the skin daily. 01/01/24 12/31/24  Nicholaus Brad RAMAN, NP    Family History History reviewed. No pertinent family history.  Social History Social History   Tobacco Use   Smoking status: Every Day    Types: Cigarettes   Smokeless tobacco: Never  Vaping Use   Vaping status: Never Used  Substance Use Topics   Alcohol use: No   Drug use: No     Allergies   Penicillins   Review of Systems Review of Systems Per HPI  Physical Exam Triage Vital Signs ED Triage Vitals  Encounter Vitals Group     BP 07/07/24 1939 138/81     Girls Systolic BP Percentile --      Girls Diastolic BP Percentile --      Boys Systolic BP Percentile --      Boys Diastolic BP Percentile --      Pulse Rate 07/07/24 1939 94     Resp 07/07/24 1939 18     Temp 07/07/24 1939 99.7 F (37.6 C)     Temp Source 07/07/24 1939 Oral     SpO2 07/07/24 1939 97 %     Weight --      Height --      Head Circumference --      Peak Flow --      Pain Score 07/07/24 1937 0     Pain Loc --      Pain Education --      Exclude from Growth Chart --    No data found.  Updated Vital Signs BP 138/81 (BP Location: Right Arm)   Pulse 94   Temp 99.7 F (37.6 C) (Oral)   Resp 18   LMP 06/10/2024 (Approximate)   SpO2 97%   Visual Acuity Right Eye Distance:   Left Eye Distance:   Bilateral Distance:    Right Eye Near:   Left Eye Near:    Bilateral Near:     Physical Exam Vitals and nursing note reviewed.  Constitutional:      Appearance: She is not ill-appearing or toxic-appearing.  HENT:     Head: Normocephalic and atraumatic.     Right Ear: Hearing and  external ear normal.     Left Ear: Hearing and external ear normal.     Nose: Nose normal.  Mouth/Throat:     Lips: Pink.  Eyes:     General: Lids are normal. Vision grossly intact. Gaze aligned appropriately.     Extraocular Movements: Extraocular movements intact.     Conjunctiva/sclera: Conjunctivae normal.  Pulmonary:     Effort: Pulmonary effort is normal.  Genitourinary:    Comments: Deferred. Musculoskeletal:     Cervical back: Neck supple.  Skin:    General: Skin is warm and dry.     Capillary Refill: Capillary refill takes less than 2 seconds.     Findings: No rash.  Neurological:     General: No focal deficit present.     Mental Status: She is alert and oriented to person, place, and time. Mental status is at baseline.     Cranial Nerves: No dysarthria or facial asymmetry.  Psychiatric:        Mood and Affect: Mood normal.        Speech: Speech normal.        Behavior: Behavior normal.        Thought Content: Thought content normal.        Judgment: Judgment normal.      UC Treatments / Results  Labs (all labs ordered are listed, but only abnormal results are displayed) Labs Reviewed  CERVICOVAGINAL ANCILLARY ONLY    EKG   Radiology No results found.  Procedures Procedures (including critical care time)  Medications Ordered in UC Medications - No data to display  Initial Impression / Assessment and Plan / UC Course  I have reviewed the triage vital signs and the nursing notes.  Pertinent labs & imaging results that were available during my care of the patient were reviewed by me and considered in my medical decision making (see chart for details).   1.  Exposure to trichomonas, at risk for STD due to unprotected sex Exposure to trichomonas. Empiric treatment with flagyl  ordered.   STI labs pending, will notify patient of positive results and treat accordingly when labs result for other infection(s). Patient declines HIV and syphilis testing  today.   Safe sex precautions discussed, advised to notify sexual partners of positive test results.    Counseled patient on potential for adverse effects with medications prescribed/recommended today, strict ER and return-to-clinic precautions discussed, patient verbalized understanding.    Final Clinical Impressions(s) / UC Diagnoses   Final diagnoses:  Exposure to trichomonas  At risk for sexually transmitted disease due to unprotected sex     Discharge Instructions      Take flagyl  as prescribed. No sex for 7 days while taking flagyl .  No alcohol while taking flagyl .  If you develop any new or worsening symptoms or if your symptoms do not start to improve, please return here or follow-up with your primary care provider. If your symptoms are severe, please go to the emergency room.      ED Prescriptions     Medication Sig Dispense Auth. Provider   metroNIDAZOLE  (FLAGYL ) 500 MG tablet Take 1 tablet (500 mg total) by mouth 2 (two) times daily. 14 tablet Enedelia Dorna HERO, FNP      PDMP not reviewed this encounter.   Enedelia Dorna HERO, [place] 07/07/24 2109

## 2024-07-07 NOTE — ED Triage Notes (Signed)
 Pt reports her boyfriend was seen on Saturday here and was notified today that he had Trichomoniasis and she was told to come here to be tested. Pt denies any discharge or symptoms.

## 2024-07-08 ENCOUNTER — Ambulatory Visit (HOSPITAL_COMMUNITY): Payer: Self-pay

## 2024-07-08 LAB — CERVICOVAGINAL ANCILLARY ONLY
Chlamydia: NEGATIVE
Comment: NEGATIVE
Comment: NEGATIVE
Comment: NORMAL
Neisseria Gonorrhea: POSITIVE — AB
Trichomonas: POSITIVE — AB

## 2024-07-10 ENCOUNTER — Ambulatory Visit (HOSPITAL_BASED_OUTPATIENT_CLINIC_OR_DEPARTMENT_OTHER)
Admission: EM | Admit: 2024-07-10 | Discharge: 2024-07-10 | Disposition: A | Discharge status: Home / Self Care | Payer: MEDICAID | Attending: Family Medicine | Admitting: Family Medicine

## 2024-07-10 ENCOUNTER — Other Ambulatory Visit (HOSPITAL_BASED_OUTPATIENT_CLINIC_OR_DEPARTMENT_OTHER): Payer: Self-pay

## 2024-07-10 ENCOUNTER — Encounter (HOSPITAL_BASED_OUTPATIENT_CLINIC_OR_DEPARTMENT_OTHER): Payer: Self-pay | Admitting: Family Medicine

## 2024-07-10 ENCOUNTER — Other Ambulatory Visit (HOSPITAL_COMMUNITY): Payer: Self-pay

## 2024-07-10 DIAGNOSIS — A549 Gonococcal infection, unspecified: Secondary | ICD-10-CM

## 2024-07-10 DIAGNOSIS — A5901 Trichomonal vulvovaginitis: Secondary | ICD-10-CM

## 2024-07-10 MED ORDER — CEFTRIAXONE SODIUM 500 MG IJ SOLR
500.0000 mg | INTRAMUSCULAR | Status: DC
  Administered 2024-07-10: 500 mg via INTRAMUSCULAR

## 2024-07-10 MED ORDER — SERTRALINE HCL 50 MG PO TABS
50.0000 mg | ORAL_TABLET | Freq: Every day | ORAL | 1 refills | Status: AC
  Filled 2024-07-10: qty 15, 15d supply, fill #0

## 2024-07-10 MED ORDER — QUETIAPINE FUMARATE 300 MG PO TABS
150.0000 mg | ORAL_TABLET | Freq: Every day | ORAL | 1 refills | Status: AC
  Filled 2024-07-10: qty 8, 16d supply, fill #0

## 2024-07-10 NOTE — ED Provider Notes (Addendum)
 Cassandra Flynn CARE    CSN: [id number] Arrival date & time: 07/10/24  1536      History   Chief Complaint Chief Complaint  Patient presents with   Injections    HPI Cassandra Flynn is a 28 y.o. female.   Here for nurse visit only.  She had a positive STI swab for gonorrhea and trichomoniasis.  She was started on metronidazole  for the trichomoniasis and she is here for Rocephin /ceftriaxone  500 mg injection now.     Past Medical History:  Diagnosis Date   Asthma    Diabetes mellitus without complication (HCC)    Diabetic ketoacidosis (HCC) 06/22/2019   Hypertension    Sleep apnea     Patient Active Problem List   Diagnosis Date Noted   Cannabis abuse 01/01/2024   Schizoaffective disorder (HCC) 12/29/2023   Syncope 10/28/2023   Acute encephalopathy 10/27/2023   Uncontrolled type 2 diabetes mellitus with hyperglycemia, with long-term current use of insulin  (HCC) 10/27/2023   Hyponatremia 10/27/2023   Diabetes (HCC) 10/22/2023   OSA (obstructive sleep apnea) 10/22/2023   Fracture of left tibia and fibula, closed, initial encounter 10/21/2023   Pneumonia due to COVID-19 virus 04/03/2021   CVA (cerebral vascular accident) (HCC) 04/02/2021   COVID-19 virus infection 04/02/2021   Gastroenteritis due to COVID-19 virus 04/02/2021   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   Conversion disorder 06/22/2019   Morbid obesity (HCC) 03/24/2017   Bipolar disorder (HCC) 06/16/2014   Suicidal ideation 06/16/2014   Post traumatic stress disorder (PTSD) 06/16/2014   Severe bipolar I disorder, most recent episode mixed, with psychotic features (HCC) 06/16/2014   Oppositional defiant disorder 06/16/2014   Depression 06/15/2014    Past Surgical History:  Procedure Laterality Date   CESAREAN SECTION     CHOLECYSTECTOMY     ORIF FIBULA FRACTURE  10/22/2023   Procedure: OPEN REDUCTION INTERNAL FIXATION (ORIF) FIBULA FRACTURE;  Surgeon: Maryrose Cordella Charleston, MD;  Location: [facility];   Service: Orthopedics;;   SKIN GRAFT     TIBIA IM NAIL INSERTION Left 10/22/2023   Procedure: INTRAMEDULLARY (IM) NAIL TIBIAL;  Surgeon: Maryrose Cordella Charleston, MD;  Location: [facility];  Service: Orthopedics;  Laterality: Left;   TONSILLECTOMY      OB History     Gravida  2   Para  2   Term  2   Preterm      AB      Living  2      SAB      IAB      Ectopic      Multiple      Live Births  2            Home Medications    Prior to Admission medications   Medication Sig Start Date End Date Taking? Authorizing Provider  ARIPiprazole  (ABILIFY ) 5 MG tablet Take 1 tablet (5 mg total) by mouth daily. 01/02/24 02/01/24  Nicholaus Brad RAMAN, NP  insulin  aspart (NOVOLOG ) 100 UNIT/ML FlexPen Inject 15 Units into the skin 3 (three) times daily with meals. 15 units with meals with additional sliding scale dose based on blood sugar (0-15 units) as below - CBG 70 - 120: 0 units, CBG 121 - 150: 2 units, CBG 151 - 200: 3 units, CBG 201 - 250: 5 units, CBG 251 - 300: 8 units, CBG 301 - 350: 11 units, CBG 351 - 400: 15 units, CBG <70 or > 400 call MD 10/25/23   Darci Pore, MD  insulin  glargine (  LANTUS  SOLOSTAR) 100 UNIT/ML Solostar Pen Inject 55 Units into the skin daily. 10/25/23 02/22/24  Darci Pore, MD  Insulin  Pen Needle 32G X 4 MM MISC Use to inject insulin  4 times daily as directed 10/25/23   Darci Pore, MD  metFORMIN  (GLUCOPHAGE -XR) 500 MG 24 hr tablet Take 1,000 mg by mouth 2 (two) times daily. 10/31/23   [provider]  metroNIDAZOLE  (FLAGYL ) 500 MG tablet Take 1 tablet (500 mg total) by mouth 2 (two) times daily. 07/07/24   Enedelia Dorna HERO, FNP  nicotine  (NICODERM CQ  - DOSED IN MG/24 HR) 7 mg/24hr patch Place 1 patch (7 mg total) onto the skin daily. 01/01/24 12/31/24  Nicholaus Brad RAMAN, NP  QUEtiapine  (SEROQUEL ) 300 MG tablet Take by mouth. 06/11/24   [provider]  QUEtiapine  (SEROQUEL ) 300 MG tablet Take 0.5 tablets (150 mg total)  by mouth at bedtime for psychosis 06/11/24     sertraline  (ZOLOFT ) 50 MG tablet Take 1 tablet (50 mg total) by mouth daily. 01/02/24 07/07/24  Nicholaus Brad RAMAN, NP  sertraline  (ZOLOFT ) 50 MG tablet Take 1 tablet (50 mg total) by mouth daily for depression 06/11/24       Family History History reviewed. No pertinent family history.  Social History Social History   Tobacco Use   Smoking status: Every Day    Types: Cigarettes   Smokeless tobacco: Never  Vaping Use   Vaping status: Never Used  Substance Use Topics   Alcohol use: No   Drug use: No     Allergies   Penicillins   Review of Systems Review of Systems   Physical Exam Triage Vital Signs ED Triage Vitals  Encounter Vitals Group     BP      Girls Systolic BP Percentile      Girls Diastolic BP Percentile      Boys Systolic BP Percentile      Boys Diastolic BP Percentile      Pulse      Resp      Temp      Temp src      SpO2      Weight      Height      Head Circumference      Peak Flow      Pain Score      Pain Loc      Pain Education      Exclude from Growth Chart    No data found.  Updated Vital Signs BP (!) 135/91 (BP Location: Right Wrist)   Pulse 88   Temp 98.8 F (37.1 C) (Oral)   Resp 20   LMP 06/10/2024 (Approximate)   SpO2 96%   Visual Acuity Right Eye Distance:   Left Eye Distance:   Bilateral Distance:    Right Eye Near:   Left Eye Near:    Bilateral Near:     Physical Exam   UC Treatments / Results  Labs (all labs ordered are listed, but only abnormal results are displayed) Cervicovaginal ancillary only: 07/07/24:     Component Ref Range & Units (hover) 3 d ago  Neisseria Gonorrhea Positive Abnormal   Chlamydia Negative  Trichomonas Positive Abnormal   Comment Normal Reference Range Trichomonas - Negative  Comment Normal Reference Ranger Chlamydia - Negative  Comment Normal Reference Range Neisseria Gonorrhea - Negative  Resulting Agency [facility]       EKG   Radiology No results found.  Procedures Procedures (including critical care time)  Medications Ordered in UC Medications  cefTRIAXone  (ROCEPHIN ) injection 500 mg (500 mg Intramuscular Given 07/10/24 1602)    Initial Impression / Assessment and Plan / UC Course  I have reviewed the triage vital signs and the nursing notes.  Pertinent labs & imaging results that were available during my care of the patient were reviewed by me and considered in my medical decision making (see chart for details).  Plan of Care: Here for nurse visit only.  Gonorrhea: STI swab was positive.  Ceftriaxone  500 mg injection now.  Patient has a penicillin allergy and had never had ceftriaxone  before so she waited for 15 minutes after her shot to make sure she did not have a reaction.  Has been counseled about STIs and safe behaviors and abstaining from sex until 7 days after treatment complete.  Trichomonas: Has metronidazole  500 mg twice daily for 7 days.  Follow-up as needed.   I reviewed the plan of care with the patient and/or the patient's guardian.  The patient and/or guardian had time to ask questions and acknowledged that the questions were answered.  I provided instruction on symptoms or reasons to return here or to go to an ER, if symptoms/condition did not improve, worsened or if new symptoms occurred.  Final Clinical Impressions(s) / UC Diagnoses   Final diagnoses:  Trichomonas vaginitis  Gonorrhea in female     Discharge Instructions      Gonorrhea: Full treatment with the ceftriaxone  injection today.  Trichomonas: Continue metronidazole  500 mg twice daily for a full 7 days.  Sexually transmitted infection prevention: Avoid all sexual activity for 7 to 10 days until treatment is complete.  Use a condom with future sexual activity.  Follow-up as needed.      ED Prescriptions   None    PDMP not reviewed this encounter.   Ival Domino, FNP 07/10/24 1612     Ival Domino, FNP 07/10/24 [phone]

## 2024-07-10 NOTE — Discharge Instructions (Signed)
 Gonorrhea: Full treatment with the ceftriaxone  injection today.  Trichomonas: Continue metronidazole  500 mg twice daily for a full 7 days.  Sexually transmitted infection prevention: Avoid all sexual activity for 7 to 10 days until treatment is complete.  Use a condom with future sexual activity.  Follow-up as needed.

## 2024-07-10 NOTE — ED Triage Notes (Addendum)
 Pt presents for rocephin  500mg  inj for treatment of Gonorrhea

## 2024-07-11 ENCOUNTER — Other Ambulatory Visit (HOSPITAL_BASED_OUTPATIENT_CLINIC_OR_DEPARTMENT_OTHER): Payer: Self-pay

## 2024-07-19 ENCOUNTER — Other Ambulatory Visit (HOSPITAL_BASED_OUTPATIENT_CLINIC_OR_DEPARTMENT_OTHER): Payer: Self-pay

## 2024-07-31 ENCOUNTER — Other Ambulatory Visit (HOSPITAL_BASED_OUTPATIENT_CLINIC_OR_DEPARTMENT_OTHER): Payer: Self-pay

## 2024-08-04 ENCOUNTER — Other Ambulatory Visit (HOSPITAL_BASED_OUTPATIENT_CLINIC_OR_DEPARTMENT_OTHER): Payer: Self-pay

## 2024-08-20 ENCOUNTER — Other Ambulatory Visit (HOSPITAL_BASED_OUTPATIENT_CLINIC_OR_DEPARTMENT_OTHER): Payer: Self-pay

## 2024-10-21 ENCOUNTER — Emergency Department
Admission: EM | Admit: 2024-10-21 | Discharge: 2024-10-21 | Disposition: A | Discharge status: Home / Self Care | Payer: MEDICAID | Attending: Emergency Medicine | Admitting: Emergency Medicine

## 2024-10-21 ENCOUNTER — Other Ambulatory Visit: Payer: Self-pay

## 2024-10-21 ENCOUNTER — Emergency Department: Payer: MEDICAID

## 2024-10-21 DIAGNOSIS — I10 Essential (primary) hypertension: Secondary | ICD-10-CM | POA: Insufficient documentation

## 2024-10-21 DIAGNOSIS — M79605 Pain in left leg: Secondary | ICD-10-CM | POA: Insufficient documentation

## 2024-10-21 DIAGNOSIS — J45909 Unspecified asthma, uncomplicated: Secondary | ICD-10-CM | POA: Diagnosis not present

## 2024-10-21 DIAGNOSIS — E119 Type 2 diabetes mellitus without complications: Secondary | ICD-10-CM | POA: Diagnosis not present

## 2024-10-21 MED ORDER — HYDROCODONE-ACETAMINOPHEN 5-325 MG PO TABS
1.0000 | ORAL_TABLET | Freq: Once | ORAL | Status: AC
  Administered 2024-10-21: 1 via ORAL
  Filled 2024-10-21: qty 1

## 2024-10-21 MED ORDER — GABAPENTIN 100 MG PO CAPS: 100.0000 mg | ORAL_CAPSULE | Freq: Every day | ORAL | 0 refills | Status: AC

## 2024-10-21 NOTE — ED Provider Notes (Signed)
 [facility] Emergency Department Provider Note     Event Date/Time   First MD Initiated Contact with Patient 10/21/24 1942     (approximate)   History   Leg Pain   HPI  Cassandra Flynn is a 28 y.o. female with a past medical history of HTN, asthma, diabetes presents to the ED for evaluation of intermittent left lower extremity numbness x 3 weeks.  Patient reports she had a surgery performed 1 year ago with rods and plates placed and is now experiencing this numbness with shooting pain that goes from her ankle to below her knee.  She has been taken ibuprofen  and Tylenol  with no relief.      Physical Exam   Triage Vital Signs: ED Triage Vitals  Encounter Vitals Group     BP 10/21/24 1604 (!) 168/103     Girls Systolic BP Percentile --      Girls Diastolic BP Percentile --      Boys Systolic BP Percentile --      Boys Diastolic BP Percentile --      Pulse Rate 10/21/24 1604 (!) 112     Resp 10/21/24 1604 18     Temp 10/21/24 1604 97.9 F (36.6 C)     Temp Source 10/21/24 1604 Oral     SpO2 10/21/24 1604 100 %     Weight 10/21/24 1607 300 lb (136.1 kg)     Height 10/21/24 1607 5' 11 (1.803 m)     Head Circumference --      Peak Flow --      Pain Score 10/21/24 1605 9     Pain Loc --      Pain Education --      Exclude from Growth Chart --     Most recent vital signs: Vitals:   10/21/24 1604 10/21/24 2214  BP: (!) 168/103 137/75  Pulse: (!) 112 91  Resp: 18 17  Temp: 97.9 F (36.6 C)   SpO2: 100% 97%    General Awake, no distress.  HEENT NCAT.  CV:  Good peripheral perfusion.  RESP:  Normal effort.  ABD:  No distention.  Other:  No visible deformity to left lower extremity.  No redness or swelling noted.  Patient states decreased sensation with palpation.  Limited dorsiflexion and plantarflexion.  Pedal pulses heard on Doppler.   ED Results / Procedures / Treatments   Labs (all labs ordered are listed, but only abnormal  results are displayed) Labs Reviewed - No data to display   RADIOLOGY  I personally viewed and evaluated these images as part of my medical decision making, as well as reviewing the written report by the radiologist.  DG Tibia/Fibula Left Result Date: 10/21/2024 CLINICAL DATA:  Ankle pain EXAM: LEFT TIBIA AND FIBULA - 2 VIEW COMPARISON:  12/26/2023, 10/22/2023, 10/20/2023 FINDINGS: Status post intramedullary rodding and screw fixation of the tibia across remote, healed distal tibial fracture. Surgical plate and screw fixation of remote distal fibular fracture. No new fracture. Soft tissues are unremarkable IMPRESSION: Status post ORIF of distal tibial and fibular fractures. No acute osseous abnormality. Electronically Signed   By: Luke Bun M.D.   On: 10/21/2024 21:24   DG Ankle Complete Left Result Date: 10/21/2024 CLINICAL DATA:  Ankle pain EXAM: LEFT ANKLE COMPLETE - 3+ VIEW COMPARISON:  12/26/2023 FINDINGS: Intramedullary rod in the distal tibia with progressive healing of distal tibial fracture. Surgical plate and fixating screws at the distal fibula across remote  distal fibular fracture deformity. No new fracture abnormality. Mortise is symmetric. There are mild degenerative changes IMPRESSION: Postsurgical changes of the distal tibia and fibula with remote fracture deformities. No acute osseous abnormality Electronically Signed   By: Luke Bun M.D.   On: 10/21/2024 21:23    PROCEDURES:  Critical Care performed: No  Procedures   MEDICATIONS ORDERED IN ED: Medications  HYDROcodone -acetaminophen  (NORCO/VICODIN) 5-325 MG per tablet 1 tablet (1 tablet Oral Given 10/21/24 2039)     IMPRESSION / MDM / ASSESSMENT AND PLAN / ED COURSE  I reviewed the triage vital signs and the nursing notes.                              Clinical Course as of 10/21/24 2220  Tue Oct 21, 2024  2206 DG Ankle Complete Left IMPRESSION: Postsurgical changes of the distal tibia and fibula with  remote fracture deformities. No acute osseous abnormality   [MH]  2207 DG Tibia/Fibula Left IMPRESSION: Status post ORIF of distal tibial and fibular fractures. No acute osseous abnormality.   [MH]    Clinical Course User Index [MH] Margrette Monte A, PA-C    28 y.o. female presents to the emergency department for evaluation and treatment of left leg pain. See HPI for further details.   Differential diagnosis includes, but is not limited to fracture, radiculopathy, diabetic neuropathy, hardware complication  Patient's presentation is most consistent with acute complicated illness / injury requiring diagnostic workup.  Patient is alert and oriented.  Initial vital signs revealed BP of 168/103 and pulse rate of 112 bpm.  This significantly proved throughout her duration in the ED.  Patient is well-appearing and in no acute distress on initial assessment.  Physical exam findings are as stated above.  X-ray of the left ankle and left tib-fib are reassuring and shows postsurgical changes.  I have advised patient close follow-up with orthopedics for further evaluation.  She verbalized understanding.  In the interim, gabapentin sent to pharmacy as I suspect diabetic neuropathy could also be contributing to patient's symptoms.  Patient states she was walking and hit her big toe on a piece of furniture at home breaking the toenail.  Patient reports she did not feel this.  Given the patient's diabetic status.  I have advised her to follow-up podiatry for evaluation.  She verbalized understanding.  She is in stable condition for discharge home.  ED return precaution discussed.  FINAL CLINICAL IMPRESSION(S) / ED DIAGNOSES   Final diagnoses:  Left leg pain   Rx / DC Orders   ED Discharge Orders          Ordered    gabapentin (NEURONTIN) 100 MG capsule  Daily        10/21/24 2215           Note:  This document was prepared using Dragon voice recognition software and may include  unintentional dictation errors.    Margrette, Keelia Graybill A, PA-C 10/21/24 2220    Dorothyann Drivers, MD 10/21/24 2239

## 2024-10-21 NOTE — Discharge Instructions (Addendum)
 You were evaluated in the ED for left leg pain.  Your ankle x-ray shows postsurgical changes of the distal tibia and fibula with remote fracture deformities however no acute bony abnormalities.    Follow-up with Gustavo Level with orthopedics for further management.  Also follow-up with podiatry as discussed for toenail evaluation.

## 2024-10-21 NOTE — ED Triage Notes (Signed)
 Pt to ED via POV. Pt states surgery on left leg with a rod and screws placed approximately one year ago. Pt states shooting leg pain and numbness for three weeks. Pt ambulatory to triage.

## 2024-12-24 ENCOUNTER — Emergency Department: Payer: MEDICAID

## 2024-12-24 ENCOUNTER — Emergency Department
Admission: EM | Admit: 2024-12-24 | Discharge: 2024-12-24 | Disposition: A | Discharge status: Home / Self Care | Payer: MEDICAID | Attending: Emergency Medicine | Admitting: Emergency Medicine

## 2024-12-24 DIAGNOSIS — I10 Essential (primary) hypertension: Secondary | ICD-10-CM | POA: Insufficient documentation

## 2024-12-24 DIAGNOSIS — G8929 Other chronic pain: Secondary | ICD-10-CM | POA: Diagnosis not present

## 2024-12-24 DIAGNOSIS — J45909 Unspecified asthma, uncomplicated: Secondary | ICD-10-CM | POA: Insufficient documentation

## 2024-12-24 DIAGNOSIS — E119 Type 2 diabetes mellitus without complications: Secondary | ICD-10-CM | POA: Insufficient documentation

## 2024-12-24 DIAGNOSIS — M25572 Pain in left ankle and joints of left foot: Secondary | ICD-10-CM | POA: Insufficient documentation

## 2024-12-24 MED ORDER — KETOROLAC TROMETHAMINE 10 MG PO TABS: 10.0000 mg | ORAL_TABLET | Freq: Four times a day (QID) | ORAL | 0 refills | Status: AC | PRN

## 2024-12-24 MED ORDER — KETOROLAC TROMETHAMINE 30 MG/ML IJ SOLN
30.0000 mg | Freq: Once | INTRAMUSCULAR | Status: AC
  Administered 2024-12-24: 30 mg via INTRAMUSCULAR
  Filled 2024-12-24: qty 1

## 2024-12-24 NOTE — ED Provider Notes (Signed)
 "  [facility] Provider Note    Event Date/Time   First MD Initiated Contact with Patient 12/24/24 1644     (approximate)   History   Chief Complaint Ankle Pain   HPI  Cassandra Flynn is a 29 y.o. female with past medical history of hypertension, diabetes, asthma, stroke, and bipolar disorder who presents to the ED complaining of ankle pain.  Patient reports that she has had increased pain and swelling around her entire ankle for the past week.  She has had prior surgery to the ankle but denies any new trauma.  She states that the leg is very painful for her to bear weight on.     Physical Exam   Triage Vital Signs: ED Triage Vitals [12/24/24 1621]  Encounter Vitals Group     BP (!) 177/112     Girls Systolic BP Percentile      Girls Diastolic BP Percentile      Boys Systolic BP Percentile      Boys Diastolic BP Percentile      Pulse Rate (!) 105     Resp 18     Temp 98.2 F (36.8 C)     Temp Source Oral     SpO2 99 %     Weight (!) 310 lb (140.6 kg)     Height 5' 11 (1.803 m)     Head Circumference      Peak Flow      Pain Score 10     Pain Loc      Pain Education      Exclude from Growth Chart     Most recent vital signs: Vitals:   12/24/24 1621  BP: (!) 177/112  Pulse: (!) 105  Resp: 18  Temp: 98.2 F (36.8 C)  SpO2: 99%    Constitutional: Alert and oriented. Eyes: Conjunctivae are normal. Head: Atraumatic. Nose: No congestion/rhinnorhea. Mouth/Throat: Mucous membranes are moist.  Cardiovascular: Normal rate, regular rhythm. Grossly normal heart sounds.  2+ radial and DP pulses bilaterally. Respiratory: Normal respiratory effort.  No retractions. Lungs CTAB. Gastrointestinal: Soft and nontender. No distention. Musculoskeletal: Diffuse tenderness to palpation around the left ankle extending into the left calf with 1+ associated edema. Neurologic:  Normal speech and language. No gross focal neurologic deficits are  appreciated.    ED Results / Procedures / Treatments   Labs (all labs ordered are listed, but only abnormal results are displayed) Labs Reviewed - No data to display   RADIOLOGY Ankle x-ray reviewed and interpreted by me with intact hardware, no evidence of fracture or dislocation.  PROCEDURES:  Critical Care performed: No  Procedures   MEDICATIONS ORDERED IN ED: Medications  ketorolac  (TORADOL ) 30 MG/ML injection 30 mg (30 mg Intramuscular Given 12/24/24 1727)     IMPRESSION / MDM / ASSESSMENT AND PLAN / ED COURSE  I reviewed the triage vital signs and the nursing notes.                              29 y.o. female with past medical history of hypertension, diabetes, stroke, asthma, and bipolar disorder who presents to the ED complaining of pain and swelling in her left ankle for the past week.  Patient's presentation is most consistent with acute presentation with potential threat to life or bodily function.  Differential diagnosis includes, but is not limited to, arterial insufficiency, venous insufficiency, fracture, dislocation, hardware failure, DVT, cellulitis,  septic arthritis.  Patient nontoxic-appearing and in no acute distress, vital signs remarkable for hypertension but otherwise reassuring.  Patient is neurovascularly intact to her left lower extremity with strong DP pulse and cap refill less than 2 seconds throughout the toes of the left foot.  She is diffusely tender with some associated edema, x-ray is unremarkable and will further assess with ultrasound.  No signs of septic arthritis on exam, will treat with IM Toradol  and reassess following ultrasound.  Ultrasound is negative for DVT, pain improved on reassessment.  While patient remains hypertensive, no there is no evidence of hypertensive emergency at this time.  She is appropriate for discharge home with orthopedic follow-up, was also counseled to follow-up with her PCP for recheck of BP.  She was counseled  to return to the ED for new or worsening symptoms, patient agrees with plan.      FINAL CLINICAL IMPRESSION(S) / ED DIAGNOSES   Final diagnoses:  Chronic pain of left ankle     Rx / DC Orders   ED Discharge Orders     None        Note:  This document was prepared using Dragon voice recognition software and may include unintentional dictation errors.   Willo Dunnings, MD 12/24/24 1851  "

## 2024-12-24 NOTE — ED Triage Notes (Signed)
 Pt presents to the ED via POV from home with left ankle pain, swelling, and numbness x3 days. Pt reports surgery on same ankle in the past. Denies recent injury.

## 2024-12-24 NOTE — ED Notes (Signed)
Patient verbalizes understanding of discharge instructions. Opportunity for questioning and answers were provided. Armband removed by staff, pt discharged from ED. Ambulated out to lobby, awaiting ride home

## 2025-01-01 NOTE — Progress Notes (Signed)
 Chief Complaint:   Chief Complaint  Patient presents with   Left Ankle - Follow-up    Hospital follow up--ankle pain. Had surgery on knee, then fell.     Subjective  HPI  Cassandra Flynn is a 29 y.o. female who presents for Follow-up of the Left Ankle [facility] follow up--ankle pain. Had surgery on knee, then fell. ) History of Present Illness Cassandra Flynn is a 29 year old female with prior left lower extremity surgery who presents with three months of severe left leg numbness, pain, and weakness.  For the past three months, she has experienced persistent and profound numbness in the left leg, beginning at the toes and radiating proximally to the knee. She describes the leg as feeling like 'dead weight.' The numbness is constant and unremitting.  She reports severe, sharp, electric, and shooting pain originating in the toes and radiating up the left leg. The pain is frequently unbearable, has prompted emergency care visits, and she has expressed a desire for amputation due to its intensity. Pain is worse at night, significantly disrupting sleep. Swelling of the left leg is intermittent and at times impairs ambulation. She is unable to work or care for her children due to symptom severity, but continues to ambulate despite discomfort.  Significant left leg weakness developed concurrently with the onset of numbness and pain. She is unable to lift the foot or toes, with only minimal toe movement preserved. Dorsiflexion is markedly limited and painful, particularly at the ankle. She denies back pain, recent trauma, fever, chills, or other systemic symptoms.  She underwent left leg surgery last year, but current symptoms did not begin at that time. She was previously evaluated and scheduled for a nerve conduction study, but was unable to attend. A recent left leg x-ray was stable. Trials of Terazol and gabapentin  provided no relief, and she is not currently taking medications for her  symptoms.  Review of Systems  Patient Active Problem List  Diagnosis   Gallstone pancreatitis (HHS-HCC)   Cholelithiasis   Obesity   Type 2 diabetes mellitus in patient with obesity (CMS/HHS-HCC)   Bipolar disorder (CMS/HHS-HCC)   Closed fracture of distal end of left tibia   Closed fracture of distal end of left fibula    Outpatient Medications Prior to Visit  Medication Sig Dispense Refill   albuterol  90 mcg/actuation inhaler Inhale 2 inhalations into the lungs every 4 (four) hours as needed for Wheezing.     aspirin  325 MG EC tablet Take 325 mg by mouth 2 (two) times daily with meals     beclomethasone (QVAR) 40 mcg/actuation inhaler Inhale 2 inhalations into the lungs 2 (two) times daily.     buPROPion  (WELLBUTRIN  XL) 150 MG XL tablet Take 150 mg by mouth once daily.     divalproex  (DEPAKOTE  ER) 500 MG ER tablet Take 500 mg by mouth once daily.     gabapentin  (NEURONTIN ) 300 MG capsule Take 1 capsule (300 mg total) by mouth 2 (two) times daily 60 capsule 1   ibuprofen  (MOTRIN ) 800 MG tablet Take 1 tablet (800 mg total) by mouth every 6 (six) hours as needed for Pain 30 tablet 1   insulin  NPH-REGULAR (NOVOLIN 70-30 FLEXPEN U-100) 100 unit/mL (70-30) pen injector NOVOLIN 70/30 FLEXPEN (70-30) 100 UNIT/ML SUPN     metFORMIN  (GLUCOPHAGE ) 500 MG tablet Take 500 mg by mouth 2 (two) times daily with meals.     montelukast (SINGULAIR) 10 mg tablet Take 10 mg by mouth nightly.  NOVOLOG  FLEXPEN U-100 INSULIN  pen injector (concentration 100 units/mL) Inject 15 Units subcutaneously     oxyCODONE  (ROXICODONE ) 5 MG immediate release tablet Take 1 tablet (5 mg total) by mouth once daily as needed for Pain 10 tablet 0   QUEtiapine  (SEROQUEL ) 300 MG tablet Take 0.5 tablets (150 mg total) by mouth at bedtime for psychosis     sertraline  (ZOLOFT ) 50 MG tablet Take 1 tablet (50 mg total) by mouth daily for depression     solifenacin (VESICARE) 5 MG tablet Take 5 mg by mouth  once daily.     tiZANidine  (ZANAFLEX ) 4 MG tablet Take 1 tablet (4 mg total) by mouth 3 (three) times daily 30 tablet 1   No facility-administered medications prior to visit.      Objective  Vitals:   01/01/25 1037  PainSc: 10-Worst pain ever  PainLoc: Ankle   There is no height or weight on file to calculate BMI.  Home Vitals:     Physical Exam   General/Constitutional: No apparent distress: well-nourished and well developed.  Vascular:Left foot:  Dorsalis Pedis:  present Posterior Tibial:  present      Right foot:  Dorsalis Pedis:  present Posterior Tibial:  present  Neuro: She is profoundly neuropathic from the knee distally.  Gross sensation light touch protective sensation are all completely absent.  Squeezing and pinching of the skin has no response.  Derm: No ulcers or pre-ulcerative lesions.  Well-healed scars to the anterior aspect of the ankle and the lateral fibula.  Also on the anterior distal tibia from the previous rod placement.  No wounds.  No concern for infection.  Ortho/MS: Right lower extremity is asymptomatic with excellent strength and range of motion She does have edema to the left ankle and foot region.  I can actively perform range of motion of the ankle and she is guarded with maximal dorsiflexion.  She does have fairly severe weakness especially with dorsiflexion of the foot at the ankle region.  I reviewed her x-rays from the ER that shows the previous ORIF of distal tibial fracture with intramedullary rod fixation with a lateral fibular plate as well.  No signs of motion from previous x-rays.  The following labs were personally reviewed: - Lab Results  Component Value Date   WBC 7.3 05/24/2014   HCT 0.35 (L) 05/24/2014   PLT 217 05/24/2014   - Lab Results  Component Value Date   NA 136 05/24/2014   K 4.1 05/24/2014   CL 109 (H) 05/24/2014   CO2 22 05/24/2014   BUN 13 05/24/2014   CREATININE 0.7 05/24/2014   GLUCOSE 124 05/24/2014    - Lab Results  Component Value Date   NA 136 05/24/2014   K 4.1 05/24/2014   CL 109 (H) 05/24/2014   CO2 22 05/24/2014   BUN 13 05/24/2014   CREATININE 0.7 05/24/2014   CALCIUM  8.8 05/24/2014   ALB 3.2 (L) 05/24/2014   TBILI 0.3 (L) 05/24/2014   ALKPHOS 73 05/24/2014   AST 23 05/24/2014   ALT 22 05/24/2014   GLUCOSE 124 05/24/2014   GFR  05/24/2014     Comment:      eGFR not calculated based upon patient's age or missing information.   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 Symptoms are severe and refractory to prior pharmacologic therapy, without history of trauma or recent surgery. Examination demonstrated decreased sensation from the knee distally, weakness with dorsiflexion, and pain with movement. Etiology is suspected to be neurological, likely peripheral neuropathy, but further diagnostic evaluation is required. Chronic pain is substantially affecting her quality of life. - Referred to neurology for further evaluation, including nerve conduction studies and imaging. - Referred to pain management for assistance with chronic pain control. - Advised her to expedite specialist appointments due to anticipated scheduling delays.  Diagnoses and all orders for this visit:  Neuropathic pain of lower extremity, left  Type 2 diabetes mellitus in patient with obesity (CMS/HHS-HCC)          Future Appointments   This patient does not currently have any appointments scheduled.     There are no Patient Instructions on file for this visit.   This note has been created using automated tools and  reviewed for accuracy by JUSTIN ALLEN FOWLER.

## 2025-01-15 ENCOUNTER — Emergency Department
Admission: EM | Admit: 2025-01-15 | Discharge: 2025-01-15 | Disposition: A | Discharge status: Home / Self Care | Payer: MEDICAID | Attending: Emergency Medicine | Admitting: Emergency Medicine

## 2025-01-15 ENCOUNTER — Other Ambulatory Visit: Payer: Self-pay

## 2025-01-15 DIAGNOSIS — E1165 Type 2 diabetes mellitus with hyperglycemia: Secondary | ICD-10-CM | POA: Insufficient documentation

## 2025-01-15 DIAGNOSIS — R200 Anesthesia of skin: Secondary | ICD-10-CM | POA: Insufficient documentation

## 2025-01-15 DIAGNOSIS — M79605 Pain in left leg: Secondary | ICD-10-CM | POA: Insufficient documentation

## 2025-01-15 DIAGNOSIS — N3001 Acute cystitis with hematuria: Secondary | ICD-10-CM | POA: Insufficient documentation

## 2025-01-15 DIAGNOSIS — E871 Hypo-osmolality and hyponatremia: Secondary | ICD-10-CM | POA: Insufficient documentation

## 2025-01-15 LAB — BASIC METABOLIC PANEL WITH GFR
Anion gap: 14 (ref 5–15)
BUN: 10 mg/dL (ref 6–20)
CO2: 22 mmol/L (ref 22–32)
Calcium: 9.2 mg/dL (ref 8.9–10.3)
Chloride: 98 mmol/L (ref 98–111)
Creatinine, Ser: 0.72 mg/dL (ref 0.44–1.00)
GFR, Estimated: >60 mL/min
Glucose, Bld: 348 mg/dL — ABNORMAL HIGH (ref 70–99)
Potassium: 4.2 mmol/L (ref 3.5–5.1)
Sodium: 134 mmol/L — ABNORMAL LOW (ref 135–145)

## 2025-01-15 LAB — URINALYSIS, ROUTINE W REFLEX MICROSCOPIC
Bacteria, UA: NONE SEEN
Bilirubin Urine: NEGATIVE
Glucose, UA: >=500 mg/dL — AB
Hgb urine dipstick: NEGATIVE
Ketones, ur: 20 mg/dL — AB
Nitrite: NEGATIVE
Protein, ur: 100 mg/dL — AB
Specific Gravity, Urine: 1.032 — ABNORMAL HIGH (ref 1.005–1.030)
WBC, UA: >50 WBC/hpf (ref 0–5)
pH: 5 (ref 5.0–8.0)

## 2025-01-15 LAB — WET PREP, GENITAL
Clue Cells Wet Prep HPF POC: NONE SEEN
Sperm: NONE SEEN
Trich, Wet Prep: NONE SEEN
WBC, Wet Prep HPF POC: >=10 — AB
Yeast Wet Prep HPF POC: NONE SEEN

## 2025-01-15 LAB — CHLAMYDIA/NGC RT PCR (ARMC ONLY)
Chlamydia Tr: NOT DETECTED
N gonorrhoeae: NOT DETECTED

## 2025-01-15 LAB — CBC
HCT: 43.2 % (ref 36.0–46.0)
Hemoglobin: 14.7 g/dL (ref 12.0–15.0)
MCH: 29.3 pg (ref 26.0–34.0)
MCHC: 34 g/dL (ref 30.0–36.0)
MCV: 86.2 fL (ref 80.0–100.0)
Platelets: 275 10*3/uL (ref 150–400)
RBC: 5.01 MIL/uL (ref 3.87–5.11)
RDW: 12.1 % (ref 11.5–15.5)
WBC: 10.1 10*3/uL (ref 4.0–10.5)
nRBC: 0 % (ref 0.0–0.2)

## 2025-01-15 MED ORDER — NITROFURANTOIN MONOHYD MACRO 100 MG PO CAPS
100.0000 mg | ORAL_CAPSULE | Freq: Once | ORAL | Status: AC
  Administered 2025-01-15: 100 mg via ORAL
  Filled 2025-01-15: qty 1

## 2025-01-15 MED ORDER — NITROFURANTOIN MONOHYD MACRO 100 MG PO CAPS: 100.0000 mg | ORAL_CAPSULE | Freq: Two times a day (BID) | ORAL | 0 refills | Status: AC

## 2025-01-15 MED ORDER — ACETAMINOPHEN 500 MG PO TABS
1000.0000 mg | ORAL_TABLET | Freq: Once | ORAL | Status: AC
  Administered 2025-01-15: 1000 mg via ORAL
  Filled 2025-01-15: qty 2

## 2025-01-15 MED ORDER — OXYCODONE HCL 5 MG PO TABS
5.0000 mg | ORAL_TABLET | Freq: Once | ORAL | Status: AC
  Administered 2025-01-15: 5 mg via ORAL
  Filled 2025-01-15: qty 1

## 2025-01-15 NOTE — ED Notes (Signed)
 Says noticed lump in right groin area and thinks possible abscess since yesterday.  Buring with urination since yesterday.  Understands she needs urine specimen.

## 2025-01-15 NOTE — Discharge Instructions (Addendum)
 You have been seen in the Emergency Department (ED) today for pain when urinating.  Your workup today suggests that you have a urinary tract infection (UTI).  Please take your antibiotic as prescribed and over-the-counter pain medication (Tylenol  or Motrin ) as needed, but no more than recommended on the label instructions.  Drink PLENTY of fluids.  Call your regular doctor to schedule the next available appointment to follow up on todays ED visit, or return immediately to the ED if your pain worsens, you have decreased urine production, develop fever, persistent vomiting, or other symptoms that concern you.   I will call you if your gonorrhea and chlamydia results are positive and need to send medicines.  Call Dr. Ladonna office and get the information for neurology and chronic pain management #s to follow-up with them for your leg pain.

## 2025-01-15 NOTE — ED Provider Notes (Signed)
 "  [facility] Provider Note    Event Date/Time   First MD Initiated Contact with Patient 01/15/25 1504     (approximate)   History   Leg Pain   HPI  Cassandra Flynn is a 29 y.o. female  with a past medical history of Type 2 diabetes, CVA, schizoaffective disorder, OSA, prior ORIF of left tib-fib, conversion disorder, ODD, bipolar I presents to the emergency department with dysuria, increased urinary frequency, suprapubic pain and vaginal itching that started today. She denies rash, abnormal vaginal bleeding or discharge, chest pain, SOB, abdominal pain, back pain, vomiting, fever, chills, fall or injury.  She also reports approximately 4 months of chronic left lower extremity pain and numbness from just below the left knee to the toes.  She denies any new symptoms. She has been taking Tylenol  at home without pain relief, most recently last night.  She has tried gabapentin  as well without relief. She reports she has been ambulating on the leg but it causes significant pain although she reports she cannot feel anything below her left knee.  I reviewed the medical chart. Patient is established with podiatry Dr. Ashley for neuropathic pain of LLE with previous ORIF of distal tibial fx w/ intramedullary rod fixation and lateral fibular plate. Last appt 1/22 showed A&P of referral to pain management for chronic pain control, neurology referral for NCS and further imaging.  Patient reports she has not yet followed up with neurology or pain management given they have not reached out to her.  Physical Exam   Triage Vital Signs: ED Triage Vitals  Encounter Vitals Group     BP 01/15/25 1145 (!) 120/59     Girls Systolic BP Percentile --      Girls Diastolic BP Percentile --      Boys Systolic BP Percentile --      Boys Diastolic BP Percentile --      Pulse Rate 01/15/25 1145 (!) 125     Resp 01/15/25 1145 20     Temp 01/15/25 1145 98.7 F (37.1 C)     Temp Source  01/15/25 1145 Oral     SpO2 01/15/25 1145 100 %     Weight --      Height --      Head Circumference --      Peak Flow --      Pain Score 01/15/25 1121 10     Pain Loc --      Pain Education --      Exclude from Growth Chart --     Most recent vital signs: Vitals:   01/15/25 1536 01/15/25 1747  BP: (!) 143/94   Pulse: (!) 108 88  Resp: 19   Temp: 98.2 F (36.8 C)   SpO2: 99%     General: Awake, in no acute distress. Appears stated age. Head: Normocephalic, atraumatic. CV: Good peripheral perfusion. No lower extremity edema.  Respiratory: Normal respiratory effort.  No respiratory distress.  GI: Soft, non-distended, non-tender. Mildly tender to suprapubic region.  MSK: Normal ROM and 5/5 strength in RLE. Patient has a good amount of active plantarflexion, dorsiflexion and toe flexion of the LLE. No sensation felt below the left knee extending into the left foot. Skin:Warm, dry, intact. No asymmetrical calf swelling or pain. Healed scar to anterior aspect of left ankle and lateral fibula. No open wounds, erythema or increased warmth. No increased tenderness with palpation. GU: deferred  ED Results / Procedures / Treatments  Labs (all labs ordered are listed, but only abnormal results are displayed) Labs Reviewed  WET PREP, GENITAL - Abnormal; Notable for the following components:      Result Value   WBC, Wet Prep HPF POC >=10 (*)    All other components within normal limits  URINALYSIS, ROUTINE W REFLEX MICROSCOPIC - Abnormal; Notable for the following components:   Color, Urine YELLOW (*)    APPearance HAZY (*)    Specific Gravity, Urine 1.032 (*)    Glucose, UA >=500 (*)    Ketones, ur 20 (*)    Protein, ur 100 (*)    Leukocytes,Ua MODERATE (*)    All other components within normal limits  BASIC METABOLIC PANEL WITH GFR - Abnormal; Notable for the following components:   Sodium 134 (*)    Glucose, Bld 348 (*)    All other components within normal limits   CHLAMYDIA/NGC RT PCR ([facility] ONLY)            CBC     EKG     RADIOLOGY    PROCEDURES:  Critical Care performed: No   Procedures   MEDICATIONS ORDERED IN ED: Medications  nitrofurantoin  (macrocrystal-monohydrate) (MACROBID ) capsule 100 mg (has no administration in time range)  acetaminophen  (TYLENOL ) tablet 1,000 mg (1,000 mg Oral Given 01/15/25 1530)  oxyCODONE  (Oxy IR/ROXICODONE ) immediate release tablet 5 mg (5 mg Oral Given 01/15/25 1529)     IMPRESSION / MDM / ASSESSMENT AND PLAN / ED COURSE  I reviewed the triage vital signs and the nursing notes.                              Differential diagnosis includes, but is not limited to, UTI, vaginal candidiasis, STI, chronic left leg pain and numbness  Patient's presentation is most consistent with acute complicated illness / injury requiring diagnostic workup.  Patient is a 29 year old female presenting with symptoms concerning for UTI.  Labs obtained in triage.  CBC unremarkable, no leukocytosis.  BMP with sodium of 134, glucose of 348.  No anion gap or CO2 change to suggest DKA. Told her she will need to take her diabetes medication once returning home. Patient overall well-appearing.  Able to ambulate with some assistance and encouragement.  No fevers or vomiting. UA with glucose urea over 500, 20 ketones, protein, moderate leukocytes, 21-50 RBCs, greater than 50 WBCs. Glucosuria >/= 500 consistent with past lab values.    Will treat for UTI. Given penicillin allergy, will do macrobid .  Wet prep negative for yeast, trichomoniasis, BV.  Gonorrhea and Chlamydia PCR negative.  Patient also had some concerns about her chronic left leg pain and numbness as well.  She has had no changes to her pain since Dr. Ashley saw her in late January.  She has good range of motion of the ankle and toes.  No area of the numbness is new on exam.  Low suspicion for infection. She is already established with podiatrist Dr. Ashley.  Told her she  will need to follow-up with him and to give his office a call to get to appointments with neurology and chronic pain management scheduled. Discussed continuing her regular pain regimen now of OTC meds as we do not manage chronic pain in the ED.  The patient may return to the emergency department for any new, worsening, or concerning symptoms. Patient was given the opportunity to ask questions; all questions were answered. Emergency department return precautions  were discussed with the patient.  Patient is in agreement to the treatment plan.  Patient is stable for discharge.    FINAL CLINICAL IMPRESSION(S) / ED DIAGNOSES   Final diagnoses:  Left leg pain  Left leg numbness  Acute cystitis with hematuria     Rx / DC Orders   ED Discharge Orders          Ordered    nitrofurantoin , macrocrystal-monohydrate, (MACROBID ) 100 MG capsule  2 times daily        01/15/25 1747             Note:  This document was prepared using Dragon voice recognition software and may include unintentional dictation errors.     Sheron Salm, PA-C 01/15/25 1806    Arlander Charleston, MD 01/15/25 2232  "

## 2025-01-15 NOTE — ED Triage Notes (Signed)
 Pt comes via /EMS from home with left leg pain. Pt stats broke ankle last year and has plates placed. Pt has numbness and pain since but worse since yesterday pt can't bear weight. Pt also states mass to groin area and pain with urination.   100.3 CBG 299
